# Patient Record
Sex: Male | Born: 1935 | Race: White | Hispanic: No | Smoking: Former smoker
Health system: Southern US, Community
[De-identification: ages and names within clinical notes are randomized; demographics above are authoritative.]

## PROBLEM LIST (undated history)

## (undated) DIAGNOSIS — M1990 Unspecified osteoarthritis, unspecified site: Secondary | ICD-10-CM

## (undated) DIAGNOSIS — K219 Gastro-esophageal reflux disease without esophagitis: Secondary | ICD-10-CM

## (undated) DIAGNOSIS — I10 Essential (primary) hypertension: Secondary | ICD-10-CM

## (undated) DIAGNOSIS — I480 Paroxysmal atrial fibrillation: Secondary | ICD-10-CM

## (undated) DIAGNOSIS — R001 Bradycardia, unspecified: Secondary | ICD-10-CM

## (undated) DIAGNOSIS — E785 Hyperlipidemia, unspecified: Secondary | ICD-10-CM

## (undated) HISTORY — PX: TONSILLECTOMY: SUR1361

## (undated) HISTORY — DX: Essential (primary) hypertension: I10

## (undated) HISTORY — DX: Paroxysmal atrial fibrillation: I48.0

## (undated) HISTORY — DX: Hyperlipidemia, unspecified: E78.5

## (undated) HISTORY — DX: Bradycardia, unspecified: R00.1

## (undated) HISTORY — PX: EYE SURGERY: SHX253

---
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
## 1966-08-07 HISTORY — PX: VASECTOMY: SHX75

## 1990-08-26 HISTORY — PX: OTHER SURGICAL HISTORY: SHX169

## 1999-02-05 HISTORY — PX: OTHER SURGICAL HISTORY: SHX169

## 2001-06-05 ENCOUNTER — Inpatient Hospital Stay (HOSPITAL_COMMUNITY): Admission: AD | Admit: 2001-06-05 | Discharge: 2001-06-07 | Payer: Self-pay | Admitting: Cardiology

## 2001-06-06 ENCOUNTER — Encounter: Payer: Self-pay | Admitting: Cardiology

## 2002-07-07 ENCOUNTER — Ambulatory Visit (HOSPITAL_COMMUNITY): Admission: RE | Admit: 2002-07-07 | Discharge: 2002-07-08 | Payer: Self-pay | Admitting: Internal Medicine

## 2002-07-07 ENCOUNTER — Encounter: Payer: Self-pay | Admitting: Internal Medicine

## 2002-07-07 HISTORY — PX: OTHER SURGICAL HISTORY: SHX169

## 2002-07-08 ENCOUNTER — Encounter: Payer: Self-pay | Admitting: Internal Medicine

## 2004-07-21 ENCOUNTER — Ambulatory Visit: Payer: Self-pay | Admitting: Internal Medicine

## 2004-10-21 ENCOUNTER — Ambulatory Visit: Payer: Self-pay | Admitting: Internal Medicine

## 2005-01-19 ENCOUNTER — Ambulatory Visit: Payer: Self-pay | Admitting: Internal Medicine

## 2005-04-20 ENCOUNTER — Ambulatory Visit: Payer: Self-pay | Admitting: Internal Medicine

## 2005-08-16 ENCOUNTER — Ambulatory Visit: Payer: Self-pay | Admitting: Internal Medicine

## 2005-09-18 ENCOUNTER — Ambulatory Visit: Payer: Self-pay | Admitting: Internal Medicine

## 2005-10-16 ENCOUNTER — Ambulatory Visit: Payer: Self-pay | Admitting: Internal Medicine

## 2005-11-28 ENCOUNTER — Ambulatory Visit: Payer: Self-pay | Admitting: Internal Medicine

## 2006-02-06 ENCOUNTER — Ambulatory Visit: Payer: Self-pay | Admitting: Internal Medicine

## 2006-03-22 ENCOUNTER — Ambulatory Visit: Payer: Self-pay | Admitting: Internal Medicine

## 2006-04-05 ENCOUNTER — Ambulatory Visit: Payer: Self-pay

## 2006-04-26 ENCOUNTER — Ambulatory Visit: Payer: Self-pay | Admitting: Internal Medicine

## 2006-05-25 ENCOUNTER — Ambulatory Visit: Payer: Self-pay | Admitting: Internal Medicine

## 2006-06-22 ENCOUNTER — Ambulatory Visit: Payer: Self-pay | Admitting: Internal Medicine

## 2006-07-20 ENCOUNTER — Ambulatory Visit: Payer: Self-pay | Admitting: Internal Medicine

## 2006-08-17 ENCOUNTER — Ambulatory Visit: Payer: Self-pay | Admitting: Internal Medicine

## 2006-09-12 ENCOUNTER — Ambulatory Visit: Payer: Self-pay | Admitting: Internal Medicine

## 2006-10-12 ENCOUNTER — Ambulatory Visit: Payer: Self-pay | Admitting: Internal Medicine

## 2006-11-08 ENCOUNTER — Ambulatory Visit: Payer: Self-pay | Admitting: Internal Medicine

## 2007-01-31 ENCOUNTER — Ambulatory Visit: Payer: Self-pay | Admitting: Internal Medicine

## 2007-02-04 ENCOUNTER — Emergency Department (HOSPITAL_COMMUNITY): Admission: EM | Admit: 2007-02-04 | Discharge: 2007-02-04 | Payer: Self-pay | Admitting: Emergency Medicine

## 2007-03-01 ENCOUNTER — Ambulatory Visit: Payer: Self-pay | Admitting: Internal Medicine

## 2007-03-29 ENCOUNTER — Ambulatory Visit: Payer: Self-pay | Admitting: Internal Medicine

## 2007-04-26 ENCOUNTER — Ambulatory Visit: Payer: Self-pay | Admitting: Internal Medicine

## 2007-05-14 ENCOUNTER — Ambulatory Visit: Payer: Self-pay | Admitting: Internal Medicine

## 2007-05-24 ENCOUNTER — Ambulatory Visit: Payer: Self-pay | Admitting: Internal Medicine

## 2007-06-21 ENCOUNTER — Ambulatory Visit: Payer: Self-pay | Admitting: Internal Medicine

## 2007-07-19 ENCOUNTER — Ambulatory Visit: Payer: Self-pay | Admitting: Internal Medicine

## 2007-09-16 ENCOUNTER — Ambulatory Visit: Payer: Self-pay | Admitting: Internal Medicine

## 2008-01-13 ENCOUNTER — Ambulatory Visit: Payer: Self-pay | Admitting: Internal Medicine

## 2008-04-14 ENCOUNTER — Ambulatory Visit: Payer: Self-pay | Admitting: Internal Medicine

## 2008-05-26 ENCOUNTER — Ambulatory Visit: Payer: Self-pay | Admitting: Internal Medicine

## 2008-07-14 ENCOUNTER — Ambulatory Visit: Payer: Self-pay | Admitting: Internal Medicine

## 2008-10-13 ENCOUNTER — Ambulatory Visit: Payer: Self-pay | Admitting: Internal Medicine

## 2008-11-20 ENCOUNTER — Encounter (INDEPENDENT_AMBULATORY_CARE_PROVIDER_SITE_OTHER): Payer: Self-pay | Admitting: *Deleted

## 2009-02-10 ENCOUNTER — Ambulatory Visit: Payer: Self-pay | Admitting: Internal Medicine

## 2009-05-12 ENCOUNTER — Ambulatory Visit: Payer: Self-pay | Admitting: Internal Medicine

## 2009-06-02 ENCOUNTER — Ambulatory Visit: Payer: Self-pay | Admitting: Internal Medicine

## 2009-06-02 DIAGNOSIS — Z950 Presence of cardiac pacemaker: Secondary | ICD-10-CM | POA: Insufficient documentation

## 2009-06-02 DIAGNOSIS — I495 Sick sinus syndrome: Secondary | ICD-10-CM | POA: Insufficient documentation

## 2009-06-02 DIAGNOSIS — I10 Essential (primary) hypertension: Secondary | ICD-10-CM | POA: Insufficient documentation

## 2009-06-02 DIAGNOSIS — E785 Hyperlipidemia, unspecified: Secondary | ICD-10-CM

## 2009-08-11 ENCOUNTER — Ambulatory Visit: Payer: Self-pay | Admitting: Internal Medicine

## 2009-11-10 ENCOUNTER — Ambulatory Visit: Payer: Self-pay | Admitting: Internal Medicine

## 2010-02-09 ENCOUNTER — Ambulatory Visit: Payer: Self-pay | Admitting: Internal Medicine

## 2010-05-11 ENCOUNTER — Ambulatory Visit: Payer: Self-pay | Admitting: Internal Medicine

## 2010-06-14 ENCOUNTER — Ambulatory Visit: Payer: Self-pay | Admitting: Internal Medicine

## 2010-08-10 ENCOUNTER — Encounter: Payer: Self-pay | Admitting: Internal Medicine

## 2010-08-25 ENCOUNTER — Ambulatory Visit: Payer: Self-pay | Admitting: Internal Medicine

## 2010-09-06 NOTE — Cardiovascular Report (Signed)
Summary: TTM   TTM   Imported By: Roderic Ovens 11/22/2009 12:25:28  _____________________________________________________________________  External Attachment:    Type:   Image     Comment:   External Document

## 2010-09-06 NOTE — Assessment & Plan Note (Signed)
Summary: device/saf   Visit Type:  Follow-up Primary Provider:  Dr. Aurora Mask   History of Present Illness: Maurice Hall returns today for PPM followup.  He is a pleasant 77 yo man with a h/o symptomatic bradycardia and HTN.  He denies c/p or sob.  He has rare palpitations.  No peripheral edema. No syncope.  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 His main complaint today was that he has pain in his knees when he walks or runs.  Current Medications (verified): 1)  Diovan Hct 160-12.5 Mg Tabs (Valsartan-Hydrochlorothiazide) .... Once Daily 2)  Simvastatin 20 Mg Tabs (Simvastatin) .... Once Daily 3)  Meloxicam 7.5 Mg Tabs (Meloxicam) .... Once Daily 4)  Osteo Bi-Flex Adv Joint Shield  Tabs (Misc Natural Products) .... 2 Tabs Once Daily 5)  Aspirin 325 Mg  Tabs (Aspirin) .... Once Daily 6)  Aspirin 81 Mg Tbec (Aspirin) .... Take One Tablet By Mouth Daily 7)  Fish Oil 1000 Mg Caps (Omega-3 Fatty Acids) .... 4 Daily  Allergies (verified): No Known Drug Allergies  Past History:  Past Medical History: Last updated: 10/02/2008 HTN Symptomatic bradycardia Dyslipidemia  Past Surgical History: Last updated: 10/02/2008 S/P PPM (Medtronic Sigma DDD) 12/03  Review of Systems  The patient denies chest pain, syncope, dyspnea on exertion, and peripheral edema.    Vital Signs:  Patient profile:   77 year old male Height:      74 inches Weight:      224 pounds BMI:     28.86 Pulse rate:   70 / minute BP sitting:   115 / 77  (left arm)  Vitals Entered By: Laurance Flatten CMA (June 14, 2010 4:20 PM)  Physical Exam  General:  Well developed, well nourished, in no acute distress.  HEENT: normal Neck: supple. No JVD. Carotids 2+ bilaterally no bruits Cor: RRR no rubs, gallops or murmur Lungs: CTA. Well healed PPM incision. Ab: soft, nontender. nondistended. No HSM. Good bowel sounds Ext: warm. no cyanosis, clubbing or edema Neuro: alert and oriented. Grossly nonfocal.  affect pleasant    PPM Specifications Following MD:  Lewayne Bunting, MD     PPM Vendor:  Medtronic     PPM Model Number:  [id number]     PPM Serial Number:  [id number] PPM DOI:  07/07/2002      Lead 1    Location: RA     DOI: 07/07/2002     Model #: [id number]     Serial #: [id number]     Status: active Lead 2    Location: RV     DOI: 07/07/2002     Model #: [id number]     Serial #: [id number]     Status: active   Indications:  SYNCOPE,  ASYSTOLE    PPM Follow Up Remote Check?  No Battery Voltage:  2.77 V     Battery Est. Longevity:  3 years     Pacer Dependent:  No       PPM Device Measurements Atrium  Amplitude: 2.8 mV, Impedance: 496 ohms, Threshold: 1.0 V at 0.4 msec Right Ventricle  Amplitude: 11.20 mV, Impedance: 826 ohms, Threshold: 1.0 V at 0.4 msec  Episodes MS Episodes:  1668     Percent Mode Switch:  0.5%     Coumadin:  No Ventricular High Rate:  5     Atrial Pacing:  68.3%     Ventricular Pacing:  50.1%  Parameters Mode:  DDDR     Lower Rate  Limit:  50     Upper Rate Limit:  120 Paced AV Delay:  310     Sensed AV Delay:  300 Next Cardiology Appt Due:  12/06/2010 Tech Comments:  No parameter changes. Device function normal.  5VHR episodes 1-3 seconds.  The longest mode switch was 4:27 minutes, - coumadin.  TTM's with [facility].  ROV 6months clinic. MD Comments:  Agree with above.  Impression & Recommendations:  Problem # 1:  CARDIAC PACEMAKER IN SITU (ICD-V45.01) His device is working normally.  Will recheck in several months.  Problem # 2:  ESSENTIAL HYPERTENSION, BENIGN (ICD-401.1) His blood pressure remains well controlled.  A low sodium diet is being followed. His updated medication list for this problem includes:    Diovan Hct 160-12.5 Mg Tabs (Valsartan-hydrochlorothiazide) ..... Once daily    Aspirin 325 Mg Tabs (Aspirin) ..... Once daily    Aspirin 81 Mg Tbec (Aspirin) .Marland Kitchen... Take one tablet by mouth daily  Problem # 3:  DYSLIPIDEMIA (ICD-272.4) He will continue his  current meds. His updated medication list for this problem includes:    Simvastatin 20 Mg Tabs (Simvastatin) ..... Once daily  Patient Instructions: 1)  Your physician wants you to follow-up in:  6 months with device clinic and 12 months with Dr Court Joy will receive a reminder letter in the mail two months in advance. If you don't receive a letter, please call our office to schedule the follow-up appointment.

## 2010-09-06 NOTE — Cardiovascular Report (Signed)
Summary: TTM   TTM   Imported By: Roderic Ovens 08/24/2009 14:41:55  _____________________________________________________________________  External Attachment:    Type:   Image     Comment:   External Document

## 2010-09-06 NOTE — Cardiovascular Report (Signed)
Summary: TTM   TTM   Imported By: Roderic Ovens 05/24/2010 11:07:13  _____________________________________________________________________  External Attachment:    Type:   Image     Comment:   External Document

## 2010-09-06 NOTE — Cardiovascular Report (Signed)
Summary: TTM   TTM   Imported By: Roderic Ovens 02/25/2010 09:22:53  _____________________________________________________________________  External Attachment:    Type:   Image     Comment:   External Document

## 2010-09-08 NOTE — Cardiovascular Report (Signed)
Summary: TTM   TTM   Imported By: Roderic Ovens 08/25/2010 16:30:20  _____________________________________________________________________  External Attachment:    Type:   Image     Comment:   External Document

## 2010-11-09 ENCOUNTER — Encounter: Payer: Self-pay | Admitting: Internal Medicine

## 2010-11-09 DIAGNOSIS — I498 Other specified cardiac arrhythmias: Secondary | ICD-10-CM

## 2010-12-20 NOTE — Assessment & Plan Note (Signed)
[facility]                         ELECTROPHYSIOLOGY OFFICE NOTE   NAME:[name] [name] BEBOUT                         MRN:          [id number]  DATE:05/26/2008                            DOB:          Jul 31, 1931    [name] [name] returns today for followup.  He is a very pleasant 77 year old  man with the history of hypertension who has dyslipidemia and sinus  bradycardia.  He returns today for followup.  He denies chest pain.  He  denies shortness of breath.  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  He also works out several times a week and  rides in road bike before and after football season.  He had no specific  complaints today.   MEDICATIONS:  1. Meloxicam 7.5 a day.  2. Lipitor 10 a day.  3. Question simvastatin 20 a day.  4. Benicar/HCTZ 20/12.5 twice a day.  5. Aspirin 81 mg daily.  6. Fish oil.   PHYSICAL EXAMINATION:  GENERAL:  He is a pleasant well-appearing man in  no acute distress.  VITAL SIGNS:  Blood pressure today was 122/70, the pulse 64 and regular,  respirations were 18, weight was 227 pounds.  NECK:  No jugular distention.  LUNGS:  Clear bilaterally to auscultation.  No wheezes, rales, or  rhonchi are present.  LUNGS:  There is no increased work of breathing.  Cardiovascular:  Regular rate and rhythm.  Normal S1 and S2, no murmurs,  rubs, or gallops.  ABDOMEN:  Soft, nontender.  There is no organomegaly.  EXTREMITIES:  No cyanosis, clubbing, or edema.  The pulses are 2+ and  symmetric.   Interrogation of his pacemaker demonstrates a Medtronic segment.  The P  and R waves are greater than 2 and 11 respectively.  The impedance 500  in the A, 863 in the V, thresholds of volt of 0.4 in the A and a volt of  0.4 in the right ventricle.  Battery voltage was 2.79 volts.  Estimated  longevity is 5 years.  He was mode switched for less than 1% of the  time, the longest of which was 53 seconds, he is 66% A  paced, 45% V  paced.  Despite a long AV delay.   PROBLEM:  1. Symptomatic bradycardia.  2. Hypertension.  3. Dyslipidemia.  4. Status post pacemaker insertion.   DISCUSSION:  [name] [name] is stable.  His pacemaker is working normally.  No reprogramming was made today.  With regard to his hypertension, his  blood pressure is nicely controlled with  Benicar.  With regard to dyslipidemia, he remains on simvastatin without  problem.  I will plan to see the patient back in the office for  pacemaker followup in 1 year.     [name] [name]. Ladona Ridgel, MD  Electronically Signed    GWT/MedQ  DD: 05/26/2008  DT: 05/26/2008  Job #: [id number]

## 2010-12-20 NOTE — Assessment & Plan Note (Signed)
[facility]                         ELECTROPHYSIOLOGY OFFICE NOTE   NAME:Maurice [name], Maurice [name]                         MRN:          [id number]  DATE:05/14/2007                            DOB:          August 01, 1931    HISTORY:  Mr. Meyn returns today for followup.  He is now almost five  years out from his pacemaker implant, secondary to symptomatic  bradycardia.  He also has a history of syncope.  He returns today for  followup.  He has done well.  His main complaint is that of pain in his  knees when he runs.  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  He has no specific complaints today.  He did note some dyspnea with very  extreme activity in the past, though not today.   PHYSICAL EXAMINATION:  GENERAL:  He is a pleasant, well-appearing 77-  year-old man, in no acute distress.  VITAL SIGNS:  Blood pressure 130/90, pulse 60 and regular, respirations  18.  Weight was not recorded.  NECK:  Revealed no jugular venous distention.  There is no thyromegaly.  LUNGS:  Clear bilaterally to auscultation.  No wheezes, rales or rhonchi  are present.  There is no increased work of breathing.  CARDIOVASCULAR:  A regular rate and rhythm with normal S1 and S2.  There  are no murmurs, rubs or gallops.  EXTREMITIES:  Demonstrated no clubbing, cyanosis or edema.   Interrogation of his pacemaker demonstrates a Medtronic Sigma dual-  chamber device with P-waves greater than 2 and R-waves greater than 11.  The impedance is 490 in the atrium and 711 in the ventricle.  The  threshold is 1 volt at 0.4 in the atrium and 0.5 at 0.4 in the right  ventricle.  He was A-pacing 99% of the time and V-pacing was 36% of the  time.  His AV delay was maxed out at 320.  His mode switch was turned on  today.   IMPRESSION:  1. Symptomatic bradycardia.  2. Hypertension.  3. Status post pacemaker insertion.   DISCUSSION:  Overall Mr. Formica is stable and his pacemaker is working  well.  He will continue his present medical therapy.  I will see him  back in one year.     Doylene Canning. Ladona Ridgel, MD  Electronically Signed    GWT/MedQ  DD: 05/14/2007  DT: 05/15/2007  Job #: [id number]   cc:   Abner Greenspan, M.D.

## 2010-12-23 NOTE — Discharge Summary (Signed)
   NAME:  Maurice Hall, Maurice Hall                            ACCOUNT NO.:  [id number]   MEDICAL RECORD NO.:  [id number]                   PATIENT TYPE:  OIB   LOCATION:  6529                                 FACILITY:  [facility]   PHYSICIAN:  Doylene Canning. Ladona Ridgel, M.D. [facility]           DATE OF BIRTH:  July 02, 1931   DATE OF ADMISSION:  07/07/2002  DATE OF DISCHARGE:  07/08/2002                                 DISCHARGE SUMMARY   PRIMARY DISCHARGE DIAGNOSIS:  Bradycardia.   HISTORY OF PRESENT ILLNESS:  This is a 77 year old gentleman with a history  of recurrent syncope, episodes of unexplained etiology, underwent  Implantable loop recorder insertion one year ago.  He was in his usual state  of health.  While giving blood several days ago, the patient passed out.  His cardiac monitor was activated, and he subsequently was found to have  complete heart block with ventricular 15 second pause.  He also complained  of spells occurring several months ago where he became near syncopal after  chopping up an oak tree.  The patient was admitted for placement of a  permanent pacemaker.   HOSPITAL COURSE:  He underwent placement of a dual-chamber Medtronic  pacemaker on 07/07/2002 without immediate complications.  Tolerated the  procedure well.  Interrogation of the device the following day showed it to  be within normal limits.  He was discharged to home.   DISCHARGE MEDICATIONS:  1. Coated aspirin 325 mg daily.  2. Lipitor 10 mg daily.  3. Diovan/Hydrochlorothiazide 160/25 b.i.d.  4. Vioxx 25 mg daily.  5. Tylenol 1 to 2 tablets every 4 to 6 hours as needed for pain.   ACTIVITY AND WOUND CARE:  Per pacemaker sheet.   DIET:  Low-fat, low-cholesterol, low-salt diet.    FOLLOW UP:  A followup appointment was scheduled with the pacemaker clinic  on December 17 at 9 a.m., and he is to follow up with Dr. Lewayne Bunting in  two to three months.  The office will call and schedule that appointment.      Chinita Pester,  C.R.N.P. [facility]                 Doylene Canning. Ladona Ridgel, M.D. [facility]    DS/MEDQ  D:  07/08/2002  T:  07/08/2002  Job:  [id number]   cc:   Dr. Phoebe Perch, [facility]   Kathrine Cords, R.N. [facility]  [address]  [place], [place] [address]  Fax: 1

## 2010-12-23 NOTE — Discharge Summary (Signed)
[facility]  Patient:    Maurice Hall, Maurice Hall Visit Number: [id number] MRN: [id number]          Service Type: MED Location: 2000 2037 01 Attending Physician:  Mirian Mo Dictated by:   Chinita Pester, N.P. Admit Date:  06/05/2001 Discharge Date: 06/07/2001   CC:         Dr. Shanon Rosser, [place]                           Discharge Summary  PRIMARY DIAGNOSIS:  Presyncope.  HISTORY OF PRESENT ILLNESS:  A 77 year old gentleman with no previous cardiac history who was admitted to [facility] for evaluation of sinus bradycardia with no recent syncope. The patient had a remote history of vagally mediated syncope.  He fainted at the sight of blood at the age of 44, fainted three years ago after coming out of a hot tub.  Recently, he experienced near syncope while seated across from his wife at a cafeteria, noted sudden dizziness but no chest pain, nausea, vomiting, or tachy palp. Noted to be diaphoretic by his wife.  Episode lasted a few seconds. Seen by an M.D. for follow-up.  He was noted to have blood pressure in 170s with heart rate 48.  EKG was worrisome, therefore, he was sent to the ER for blood work. He was seen by Shiv K. Harsh, M.D.  He was transferred to [facility]. [facility]. He was placed on Altace for his hypertension.  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  Dr. Graciela Husbands was not convinced that the bradycardia was responsible for the symptoms and a loop recorder was decided to be implanted. The patient had no significant bradycardia on his telemetry.  He had a stress Cardiolite performed which showed no ischemia.  He underwent placement of an implantable loop recorder on June 07, 2001.  He tolerated the procedure well and was discharged the same day in stable condition.  He was discharge on the following  medications.  DISCHARGE MEDICATIONS: 1. Altace 2.5 b.i.d. 2. Lipitor 10 nightly. 3. Vioxx 25 daily. 4. Aspirin 325 daily.  ACTIVITY:  He was instructed not to do any heavy lifting or strenuous activity for four days or no driving.  DIET:  Low fat, low cholesterol, low salt diet.  WOUND CARE:  He was to keep his incision clean and dry.  No shower for one week.  FOLLOW-UP:  He is to have a BMET in one week.  He is to follow up with Doylene Canning. Ladona Ridgel, M.D., on September 11, 2000, at 10:15 a.m. and a physicians assistant on June 21, 2001, at 9 a.m. for a wound check.  The patient is requesting to follow up with [facility] for his primary cardiologist. Dictated by:   Chinita Pester, N.P. Attending Physician:  Mirian Mo DD:  06/07/01 TD:  06/10/01 Job: [id number]

## 2010-12-23 NOTE — Procedures (Signed)
[facility]  Patient:    Maurice Hall, Maurice Hall Visit Number: [id number] MRN: [id number]          Service Type: MED Location: 2000 2037 01 Attending Physician:  Mirian Mo Dictated by:   Doylene Canning. Ladona Ridgel, M.D. [facility] Proc. Date: 06/07/01 Admit Date:  06/05/2001 Discharge Date: 06/07/2001   CC:         Shiv K. Harsh, M.D.   Procedure Report  PROCEDURE:  Implantation of an implantable loop recorder.  INDICATION:  Recurrent episodes of near-syncope along with a history of syncope.  INTRODUCTION:  The patient is a very pleasant 77 year old male with a history of near-syncope as well as syncope in the past.  He was subsequently found to have bradycardia with heart rates in the 40s.  However, it was noted that the patients bradycardia had been present for a long time, and he is now referred for implantable loop recorder.  DESCRIPTION OF PROCEDURE:  After informed consent was obtained, the patient was taken to the diagnostic EP lab in the fasted state.  After the usual preparation and draping, a total of 30 cc of lidocaine was infiltrated into the left pectoral region.  A 3 cm incision was carried out over this region and electrocautery utilized to dissect down to the fascial plane.  A subcutaneous pocket was then made with electrocautery.  The Medtronic implantable loop recorder was inserted into the pocket after irrigation had been given to the pocket, and the recorder was secured with two silk sutures. Additional kanamycin irrigation was utilized to irrigate the pocket, and the incision was closed with a layer of 2-0 Vicryl, followed by a layer of 3-0 Vicryl, followed by a layer of 4-0 Vicryl.  Benzoin was painted on the skin and Steri-Strips were applied and a pressure dressing placed and the patient returned to his room in satisfactory condition.  COMPLICATIONS:  None.  RESULTS:  This demonstrates successful implantation of a Medtronic  implantable loop recorder in a patient with unexplained syncope. Dictated by:   Doylene Canning. Ladona Ridgel, M.D. [facility] Attending Physician:  Mirian Mo DD:  06/07/01 TD:  06/08/01 Job: [id number]

## 2010-12-23 NOTE — Assessment & Plan Note (Signed)
[facility]                           ELECTROPHYSIOLOGY OFFICE NOTE   NAME:Fabro, SHAWNDELL SCHILLACI                         MRN:          [id number]  DATE:04/05/2006                            DOB:          06-20-1931    Mr. Mander was seen today in the clinic at his request complaining of some  palpitations and rapid heart rate yesterday, noted last evening and this  morning seemed to have calmed down. The one rate that he did mention was a  heart rate of 85, which is a magnet mode rate with this particular device.   On interrogation of his device, the battery voltage is 2.79.  There were 150  high atrial episodes noted, very short in duration. The longest one was 14  minutes long.  Most of them lasted only seconds.  There were two high  ventricular rate episodes, again only lasting seconds, about 3 seconds each.  None of these episodes were dated yesterday.  His histograms appeared to be  normal.  He is A sense/V sense 38.5% of the time. The rest of the time he is  pacing in either one or both chambers.  I did explain to this gentleman the  dynamics of the pacemaker and that his lower rate is set at 50 with an upper  rate of 120 and 85 is not an unusual rate for someone to be out on their  own. This seemed to appease him and we will continue to check him as  scheduled.  I instructed him if anything else arises or heart rates over 120  to please let us know.                                   Altha Harm, LPN                                Doylene Canning. Ladona Ridgel, MD   PO/MedQ  DD:  04/05/2006  DT:  04/06/2006  Job #:  [id number]

## 2010-12-23 NOTE — Op Note (Signed)
NAME:  Maurice Hall, Maurice Hall                            ACCOUNT NO.:  [id number]   MEDICAL RECORD NO.:  [id number]                   PATIENT TYPE:  OIB   LOCATION:  6529                                 FACILITY:  [facility]   PHYSICIAN:  Doylene Canning. Ladona Ridgel, M.D. [facility]           DATE OF BIRTH:  04/24/1931   DATE OF PROCEDURE:  07/07/2002  DATE OF DISCHARGE:                                 OPERATIVE REPORT   ELECTROPHYSIOLOGIC PROCEDURE NOTE:   PROCEDURE PERFORMED:  Insertion of a dual chamber pacemaker followed by  extraction of an implantable loop recorder.   INTRODUCTION:  The patient is a very pleasant, 77 year old man with a  history of unexplained syncope in the past.  Because of this, he  subsequently underwent implantable loop recorder and had been wearing this  for nearly a year when he developed a recurrent syncopal spell.  This was  associated with a 15-second pause and subsequent ventricular standstill  resulting in syncope.  He is now referred for permanent pacemaker insertion.   PROCEDURE:  After informed consent was obtained, the patient was taken to  the diagnostic EP lab in the fasting state.  After the usual preparation and  draping, a total of 30 cc of lidocaine was infiltrated into the left  infraclavicular region.  A 6-cm incision was carried out over this region,  and electrocautery utilized to dissect down to the pectoralis fascia.  Then  10 cc of contrast was injected into the left upper extremity venous system  and demonstrated a patent left subclavian vein.  It was subsequently  punctured, and the Medtronic Model #5076, 58-cm active fixation pacing lead  was placed in the right ventricle.  The ventricular lead Serial number was  [id number].  Next, the Medtronic Model 5076, 52-cm active fixation lead was  placed in the right atrium.  The atrial lead Serial number was [id number].  Mapping was then carried out in the right ventricle and at the final site,  the R waves measured  15 mV, and the pacing threshold, once the lead was  actively fixed, was 0.6 volts at 0.5 msec with a pacing impedance of  approximately 1000 ohms.  The 10-volt pacing did not result in diaphragmatic  stimulation.  Attention was then turned to the atrial lead.  The atrial lead  was placed in the right atrial appendage where P waves measured 3 mV and the  atrial threshold was 1.2 volts at 0.5 msec with pacing impedance of 598  ohms.  This was present after the lead was actively fixed.  Again 10-volt  pacing did not result in diaphragmatic stimulation.  With the atrial and  ventricular leads in satisfactory position, they were secured to the  subpectoralis fascia with a figure-of-eight silk suture.  Electrocautery was  utilized to make a subcutaneous pocket.  Kanamycin irrigation was utilized  to irrigate the pocket, and electrocautery utilized to assure hemostasis.  The Medtronic Sigma dual chamber pacemaker, Serial [id number] was connected  to the atrial and ventricular pacing leads and placed in the subcutaneous  pocket.  The generator was secured with a silk suture.  Kanamycin irrigation  was again utilized to irrigate the incision, and the incision was then  closed with a layer of 2-0 Vicryl followed by a layer of 3-0 Vicryl followed  by a layer of 4-0 Vicryl.  Benzoin was painted on the skin.  Steri-Strips  were applied and then attention turned to removal of the implantable loop  recorder.   Again, approximately 20 cc of lidocaine was infiltrated into the region over  the old implantable loop recorder.  A 4-cm incision was carried out over  this region, and electrocautery utilized to dissect down to the old  implantable loop recorder.  It was subsequently removed with gentle  traction.  The silk sutures were removed from the pocket, and the pocket was  irrigated with Kanamycin.  After electrocautery was utilized to assure  hemostasis, the incision was closed with a layer of 2-0  Vicryl, followed by  a layer of 3-0 Vicryl, followed by a layer of 4-0 Vicryl.  Benzoin was  painted on the skin.  Steri-Strips were applied and a pressure dressing  placed, and the patient returned to his room in satisfactory condition.   COMPLICATIONS:  There were no immediate procedural complications.   RESULTS:  Demonstrate successful implantation of a Medtronic dual chamber  pacemaker followed by the removal of a Medtronic implantable loop recorder  in a patient with intermittent heart block associated with syncope.                                               Doylene Canning. Ladona Ridgel, M.D. [facility]    GWT/MEDQ  D:  07/07/2002  T:  07/07/2002  Job:  [id number]   cc:   Harrie Foreman, M.D.   Yetta Flock, M.D.  [place], [place]   Kathrine Cords, R.N. [facility]  [address]  [place], [place] [address]  Fax: 1

## 2011-03-09 ENCOUNTER — Encounter: Payer: Self-pay | Admitting: Internal Medicine

## 2011-03-09 DIAGNOSIS — I498 Other specified cardiac arrhythmias: Secondary | ICD-10-CM

## 2011-04-04 ENCOUNTER — Encounter: Payer: Self-pay | Admitting: *Deleted

## 2011-05-24 LAB — COMPREHENSIVE METABOLIC PANEL
ALT: 20
AST: 21
Albumin: 3.7
Calcium: 8.8
Creatinine, Ser: 1.15
GFR calc Af Amer: >60
Sodium: 138
Total Protein: 6.3

## 2011-05-24 LAB — DIFFERENTIAL
Eosinophils Absolute: 0
Eosinophils Relative: 1
Lymphocytes Relative: 7 — ABNORMAL LOW
Lymphs Abs: 0.5 — ABNORMAL LOW
Monocytes Relative: 6

## 2011-05-24 LAB — URINE MICROSCOPIC-ADD ON

## 2011-05-24 LAB — CBC
HCT: 43.2
Hemoglobin: 14.7
MCHC: 34
MCV: 96.9
Platelets: 130 — ABNORMAL LOW
RBC: 4.46
RDW: 12.8
WBC: 6.7

## 2011-05-24 LAB — URINALYSIS, ROUTINE W REFLEX MICROSCOPIC
Glucose, UA: NEGATIVE
Ketones, ur: NEGATIVE
Leukocytes, UA: NEGATIVE
Protein, ur: NEGATIVE
pH: 6

## 2011-06-06 ENCOUNTER — Encounter: Payer: Self-pay | Admitting: Internal Medicine

## 2011-06-06 ENCOUNTER — Ambulatory Visit (INDEPENDENT_AMBULATORY_CARE_PROVIDER_SITE_OTHER): Payer: Medicare Other | Admitting: Internal Medicine

## 2011-06-06 DIAGNOSIS — Z950 Presence of cardiac pacemaker: Secondary | ICD-10-CM

## 2011-06-06 DIAGNOSIS — I10 Essential (primary) hypertension: Secondary | ICD-10-CM

## 2011-06-06 DIAGNOSIS — I495 Sick sinus syndrome: Secondary | ICD-10-CM

## 2011-06-06 LAB — PACEMAKER DEVICE OBSERVATION
AL AMPLITUDE: 2.8 mv
BAMS-0001: 175 {beats}/min
BATTERY VOLTAGE: 2.76 V
RV LEAD AMPLITUDE: 11.2 mv
RV LEAD IMPEDENCE PM: 698 Ohm
VENTRICULAR PACING PM: 45.3

## 2011-06-06 NOTE — Assessment & Plan Note (Signed)
His blood pressure is well controlled. He will continue his current medical therapy and maintain a low-sodium diet. 

## 2011-06-06 NOTE — Progress Notes (Signed)
HPI Maurice Hall returns today for followup. He is a pleasant 77yo man with a h/o symptomatic bradycardia and hypertension. He is status post pacemaker insertion. He continues to do quite well. He's had no syncope, chest pain, or shortness of breath. 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 The patient is bothered mostly by right knee pain. This has been a chronic problem for which he has had 3 surgeries in the past. He denies fevers and chills. Not on File   Current Outpatient Prescriptions  Medication Sig Dispense Refill  . aspirin 81 MG tablet Take 81 mg by mouth daily.        . fish oil-omega-3 fatty acids 1000 MG capsule Take 2 g by mouth daily.        . meloxicam (MOBIC) 7.5 MG tablet Take 7.5 mg by mouth daily.        . Misc Natural Products (OSTEO BI-FLEX JOINT SHIELD) TABS Take by mouth daily.        . simvastatin (ZOCOR) 20 MG tablet Take 20 mg by mouth at bedtime.        . valsartan-hydrochlorothiazide (DIOVAN-HCT) 160-25 MG per tablet Take 1 tablet by mouth daily.           Past Medical History  Diagnosis Date  . HTN (hypertension)   . Symptomatic bradycardia   . Dyslipidemia     ROS:   All systems reviewed and negative except as noted in the HPI.   Past Surgical History  Procedure Date  . S/p ppm (medtronic sigma ddd 12/03     Family History  Problem Relation Age of Onset  . Coronary artery disease Neg Hx      History   Social History  . Marital Status: Married    Spouse Name: N/A    Number of Children: N/A  . Years of Education: N/A   Occupational History  . Not on file.   Social History Main Topics  . Smoking status: Former Games developer  . Smokeless tobacco: Not on file  . Alcohol Use: Not on file  . Drug Use: Yes     Rare ETOH   . Sexually Active: Not on file   Other Topics Concern  . Not on file   Social History Narrative  . No narrative on file     BP 120/60  Pulse 61  Ht 6\' 2"  (1.88 m)  Wt 228 lb 12.8 oz  (103.783 kg)  BMI 29.38 kg/m2  Physical Exam:  Well appearing NAD HEENT: Unremarkable Neck:  No JVD, no thyromegally Lymphatics:  No adenopathy Back:  No CVA tenderness Lungs:  Clear no wheezes, rales, or rhonchi. Well-healed pacemaker incision. HEART:  Regular rate rhythm, no murmurs, no rubs, no clicks Abd:  soft, positive bowel sounds, no organomegally, no rebound, no guarding Ext:  2 plus pulses, no edema, no cyanosis, no clubbing Skin:  No rashes no nodules Neuro:  CN II through XII intact, motor grossly intact  DEVICE  Normal device function.  See PaceArt for details.   Assess/Plan:

## 2011-06-06 NOTE — Assessment & Plan Note (Signed)
His device is working normally. Plan to recheck in several months. He has approximately 2-1/2 years before reaching elective replacement

## 2011-06-06 NOTE — Patient Instructions (Signed)
Your physician wants you to follow-up in: 12 months with Dr. Taylor. You will receive a reminder letter in the mail two months in advance. If you don't receive a letter, please call our office to schedule the follow-up appointment.    

## 2011-09-12 ENCOUNTER — Other Ambulatory Visit: Payer: Self-pay | Admitting: Orthopedic Surgery

## 2011-09-19 ENCOUNTER — Encounter: Payer: Self-pay | Admitting: Internal Medicine

## 2011-09-19 DIAGNOSIS — I498 Other specified cardiac arrhythmias: Secondary | ICD-10-CM

## 2011-11-30 ENCOUNTER — Encounter (HOSPITAL_COMMUNITY): Payer: Self-pay | Admitting: Pharmacy Technician

## 2011-12-04 ENCOUNTER — Encounter (HOSPITAL_COMMUNITY)
Admission: RE | Admit: 2011-12-04 | Discharge: 2011-12-04 | Disposition: A | Discharge status: Home / Self Care | Payer: Medicare Other | Source: Ambulatory Visit | Attending: Orthopedic Surgery | Admitting: Orthopedic Surgery

## 2011-12-04 ENCOUNTER — Ambulatory Visit (HOSPITAL_COMMUNITY)
Admission: RE | Admit: 2011-12-04 | Discharge: 2011-12-04 | Disposition: A | Discharge status: Home / Self Care | Payer: Medicare Other | Source: Ambulatory Visit | Attending: Orthopedic Surgery | Admitting: Orthopedic Surgery

## 2011-12-04 ENCOUNTER — Encounter (HOSPITAL_COMMUNITY): Payer: Self-pay

## 2011-12-04 DIAGNOSIS — Z01812 Encounter for preprocedural laboratory examination: Secondary | ICD-10-CM | POA: Insufficient documentation

## 2011-12-04 DIAGNOSIS — Z01818 Encounter for other preprocedural examination: Secondary | ICD-10-CM | POA: Insufficient documentation

## 2011-12-04 DIAGNOSIS — R9431 Abnormal electrocardiogram [ECG] [EKG]: Secondary | ICD-10-CM | POA: Insufficient documentation

## 2011-12-04 DIAGNOSIS — Z950 Presence of cardiac pacemaker: Secondary | ICD-10-CM | POA: Insufficient documentation

## 2011-12-04 DIAGNOSIS — Z01810 Encounter for preprocedural cardiovascular examination: Secondary | ICD-10-CM | POA: Insufficient documentation

## 2011-12-04 HISTORY — DX: Unspecified osteoarthritis, unspecified site: M19.90

## 2011-12-04 HISTORY — DX: Gastro-esophageal reflux disease without esophagitis: K21.9

## 2011-12-04 LAB — URINALYSIS, ROUTINE W REFLEX MICROSCOPIC
Leukocytes, UA: NEGATIVE
Nitrite: NEGATIVE
Specific Gravity, Urine: 1.009 (ref 1.005–1.030)
Urobilinogen, UA: 0.2 mg/dL (ref 0.0–1.0)
pH: 6.5 (ref 5.0–8.0)

## 2011-12-04 LAB — SURGICAL PCR SCREEN: Staphylococcus aureus: POSITIVE — AB

## 2011-12-04 LAB — COMPREHENSIVE METABOLIC PANEL
AST: 27 U/L (ref 0–37)
Albumin: 4.2 g/dL (ref 3.5–5.2)
Calcium: 10 mg/dL (ref 8.4–10.5)
Chloride: 101 mEq/L (ref 96–112)
Creatinine, Ser: 1.02 mg/dL (ref 0.50–1.35)
Total Protein: 7.4 g/dL (ref 6.0–8.3)

## 2011-12-04 LAB — CBC
MCH: 32.7 pg (ref 26.0–34.0)
MCHC: 34.6 g/dL (ref 30.0–36.0)
MCV: 94.5 fL (ref 78.0–100.0)
Platelets: 151 10*3/uL (ref 150–400)
RDW: 12.4 % (ref 11.5–15.5)
WBC: 6.1 10*3/uL (ref 4.0–10.5)

## 2011-12-04 LAB — PROTIME-INR: INR: 0.96 (ref 0.00–1.49)

## 2011-12-04 NOTE — Progress Notes (Signed)
12/04/11 1150  OBSTRUCTIVE SLEEP APNEA  Have you ever been diagnosed with sleep apnea through a sleep study? No  Do you snore loudly (loud enough to be heard through closed doors)?  0  Do you often feel tired, fatigued, or sleepy during the daytime? 0  Has anyone observed you stop breathing during your sleep? 0  Do you have, or are you being treated for high blood pressure? 1  BMI more than 35 kg/m2? 0  Age over 76 years old? 1  Neck circumference greater than 40 cm/18 inches? 1  Gender: 1  Obstructive Sleep Apnea Score 4   Score 4 or greater  Updated health history

## 2011-12-04 NOTE — Patient Instructions (Signed)
[name]  12/04/2011   Your procedure is scheduled on:  Monday 12/11/2011 at 1235pm  Report to [facility] at 1000 AM.  Call this number if you have problems the morning of surgery: [phone]   Remember:   Do not eat food:After Midnight.  May have clear liquids:up to 6 Hours before arrival- may have clear liquids from midnight up until 635am then nothing  Clear liquids include soda, tea, black coffee, apple or grape juice, broth.  Take these medicines the morning of surgery with A SIP OF WATER: none   Do not wear jewelry  Do not wear lotions, powders, or perfumes.   Do not shave 48 hours prior to surgery-women only-shaving legs  Do not bring valuables to the hospital.  Contacts, dentures or bridgework may not be worn into surgery.  Leave suitcase in the car. After surgery it may be brought to your room.  For patients admitted to the hospital, checkout time is 11:00 AM the day of discharge.       Special Instructions: CHG Shower Use Special Wash: 1/2 bottle night before surgery and 1/2 bottle morning of surgery.   Please read over the following fact sheets that you were given: MRSA Information,Sleep apnea sheet, Incentive Spirometry sheet, Blood transfusion sheet

## 2011-12-04 NOTE — Pre-Procedure Instructions (Signed)
Talked with Aurella Corley in the call center who will get a message to Dr. Lequita Halt.

## 2011-12-05 NOTE — Pre-Procedure Instructions (Signed)
Avel Peace pa called aware of abnormal labs and nothing needs to be done, pt ok for surgery

## 2011-12-10 ENCOUNTER — Other Ambulatory Visit: Payer: Self-pay | Admitting: Orthopedic Surgery

## 2011-12-10 MED ORDER — BUPIVACAINE 0.25 % ON-Q PUMP SINGLE CATH 300ML
300.0000 mL | INJECTION | Status: DC
  Filled 2011-12-10: qty 300

## 2011-12-10 NOTE — H&P (Signed)
Maurice Hall  DOB: 02/05/1931 Married / Language: English / Race: White Male  Date of Admission:  12/11/2011  Chief Complaint:  Right Knee Pain  History of Present Illness The patient is a 78 year old male who comes in for a preoperative History and Physical. The patient is scheduled for a right total knee arthroplasty to be performed by Dr. Frank V. Aluisio, MD at [facility] on 12/11/2011. The patient is a 78 year old male who presents with knee complaints. The patient reports right knee (worse than left) symptoms including: pain, swelling, catching, giving way, weakness and stiffness which began 3 month(s) (but he has a history of previous knee issues) ago without any known injury.The patient feels that the symptoms are worsening (had scopes by both Dr. Sue and Dr. Collins over 10 years ago). Prior to being seen today the patient was previously evaluated in this clinic. Current treatment includes nonsteroidal anti-inflammatory drugs (Mobic). 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 He states that "the adrenaline was pumping" during the games so he did not notice the pain but he would be in considerable pain after the games. He states that since the season ended he is finding it very difficult to walk. At the current time the right knee is hurting him more, but his left knee is also painful. He had a cortisone injection in the left knee about 5 years ago with relief a couple months. He has never had cortisone injection in the right knee. He has never had viscosupplementation. He states that he would prefer to have his knee replaced as opposed to a temporary solution with injections. Maurice Hall is a well developed male who is alert and oriented. He is in no apparent distress. Evaluation of his hip shows a normal range of motion with no discomfort. The left knee shows no effusion. Range of motion is about 5-125 degrees. There is a slight crepitus on range of  motion. No jointline tenderness or instability. The right knee shows about a 5-10 degree varus deformity. Range is about 10-125. Marked crepitus on range of motion. Tenderness medial greater than lateral with no instability. No effusion. Pulse, sensation and motor are intact. He walks with an antalgic gait pattern. Ready to proceed with surgery. They have been treated conservatively in the past for the above stated problem and despite conservative measures, they continue to have progressive pain and severe functional limitations and dysfunction. They have failed non-operative management including home exercise, medications, and injections. It is felt that they would benefit from undergoing total joint replacement. Risks and benefits of the procedure have been discussed with the patient and they elect to proceed with surgery. There are no active contraindications to surgery such as ongoing infection or rapidly progressive neurological disease.  Allergies No Known Drug Allergies  Medication History Losartan Potassium-HCTZ (100-25MG Tablet, Oral daily) Active. Mobic (7.5MG Tablet, Oral daily) Active. Simvastatin (20MG Tablet, Oral daily) Active. Aspirin Adult Low Strength (81MG Tablet DR, Oral daily) Active. Fish Oil (5000mg daily) Active. Osteo Bi-Flex Adv Double St (2 Oral daily) Active.  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 11/19/1992 Osteoarthrosis NOS, lower leg (715.96). 06/25/1996 Bradycardia (427.89). Pacemaker Placement Hypercholesterolemia Chronic Pain Gastroesophageal Reflux Disease High blood pressure Impaired Hearing Tinnitus Pacemaker Syncope. Secondary to Bradycardia  Past Surgical History Cataract Surgery. bilateral, Lens Implants Rotator Cuff Repair. right Appendectomy. Date: 06/10/1945. Arthroscopy of Knee. bilateral; Left - 1968, Right - 1975 Arthroscopy   of Shoulder. Date: 1999. right Tonsillectomy. Date:  1938. Vasectomy. Date: 1968. Vocal Cord Growth Excision. Date: 2000. Pacemaker Placement. Date: 07/07/2002.  Family History Cancer. mother and grandfather mothers side Diabetes Mellitus. father Father. Deceased, Myocardial infarction. age 86 Mother. Deceased, Colon Cancer. age 84, Lymphoma  Social History Drug/Alcohol Rehab (Currently). no Exercise. Exercises daily; does individual sport, other and gym / weights Illicit drug use. no Current work status. retired Alcohol use. former drinker Children. 3 Previously in rehab. no Tobacco / smoke exposure. no Tobacco use. former smoker; smoke(d) 3 or more pack(s) per day; uses 2 or more can(s) smokeless per week Pain Contract. no Living situation. live with spouse Marital status. married Number of flights of stairs before winded. 4-5 Post-Surgical Plans. Plan is to go home.  Review of Systems General:Not Present- Chills, Fever, Night Sweats, Fatigue, Weight Gain, Weight Loss and Memory Loss. Skin:Not Present- Hives, Itching, Rash, Eczema and Lesions. HEENT:Not Present- Tinnitus, Headache, Double Vision, Visual Loss, Hearing Loss and Dentures. Respiratory:Not Present- Shortness of breath with exertion, Shortness of breath at rest, Allergies, Coughing up blood and Chronic Cough. Cardiovascular:Not Present- Chest Pain, Racing/skipping heartbeats, Difficulty Breathing Lying Down, Murmur, Swelling and Palpitations. Gastrointestinal:Not Present- Bloody Stool, Heartburn, Abdominal Pain, Vomiting, Nausea, Constipation, Diarrhea, Difficulty Swallowing, Jaundice and Loss of appetitie. Male Genitourinary:Not Present- Urinary frequency, Blood in Urine, Weak urinary stream, Discharge, Flank Pain, Incontinence, Painful Urination, Urgency, Urinary Retention and Urinating at Night. Musculoskeletal:Present- Joint Pain and Back Pain. Not Present- Muscle Weakness, Muscle Pain, Joint Swelling, Morning Stiffness and  Spasms. Neurological:Not Present- Tremor, Dizziness, Blackout spells, Paralysis, Difficulty with balance and Weakness. Psychiatric:Not Present- Insomnia.  Vitals Weight: 228 lb Height: 74 in Body Surface Area: 2.32 m Body Mass Index: 29.27 kg/m Pulse: 56 (Regular) Resp.: 12 (Unlabored) BP: 138/58 (Sitting, Left Arm, Standard)  Physical Exam The physical exam findings are as follows: Patient is accompanied today by his wife.   General Mental Status - Alert, cooperative and good historian. General Appearance- pleasant. Not in acute distress. Orientation- Oriented X3. Build & Nutrition- Well nourished and Well developed.   Head and Neck Head- normocephalic, atraumatic . Neck Global Assessment- supple. no bruit auscultated on the right and no bruit auscultated on the left.   Eye Pupil- Bilateral- Regular and Round. Motion- Bilateral- EOMI.   ENMT  Bilateral hearing aids  Chest and Lung Exam Auscultation: Breath sounds:- clear at anterior chest wall and - clear at posterior chest wall. Adventitious sounds:- No Adventitious sounds.   Cardiovascular Auscultation:Rhythm- Regular rate and rhythm. Heart Sounds- S1 WNL and S2 WNL. Murmurs & Other Heart Sounds:Auscultation of the heart reveals - No Murmurs.   Abdomen Palpation/Percussion:Tenderness- Abdomen is non-tender to palpation. Rigidity (guarding)- Abdomen is soft. Auscultation:Auscultation of the abdomen reveals - Bowel sounds normal.   Male Genitourinary Not done, not pertinent to present illness  Musculoskeletal Evaluation of his hip shows a normal range of motion with no discomfort. The left knee shows no effusion. Range of motion is about 5-125 degrees. There is a slight crepitus on range of motion. No jointline tenderness or instability. The right knee shows about a 5-10 degree varus deformity. Range is about 10-125. Marked crepitus on range of motion. Tenderness  medial greater than lateral with no instability. No effusion. Pulse, sensation and motor are intact. He walks with an antalgic gait pattern.  Assessment & Plan Osteoarthritis Right Knee greater than Left Knee  Patient is for a Right Total Knee Replacement and a Left Knee Cortisone Injection by Dr.   Aluisio.  Plan is to go home.  PCP - Dr. Fransico Hodges Cards - Dr. Greg Taylor  Drew Rhya Shan, PA-C  

## 2011-12-11 ENCOUNTER — Ambulatory Visit (HOSPITAL_COMMUNITY): Payer: Medicare Other | Admitting: Anesthesiology

## 2011-12-11 ENCOUNTER — Encounter (HOSPITAL_COMMUNITY): Payer: Self-pay | Admitting: Anesthesiology

## 2011-12-11 ENCOUNTER — Inpatient Hospital Stay (HOSPITAL_COMMUNITY)
Admission: RE | Admit: 2011-12-11 | Discharge: 2011-12-13 | DRG: 470 | Disposition: A | Discharge status: Home Health | Payer: Medicare Other | Source: Ambulatory Visit | Attending: Orthopedic Surgery | Admitting: Orthopedic Surgery

## 2011-12-11 ENCOUNTER — Encounter (HOSPITAL_COMMUNITY): Payer: Self-pay

## 2011-12-11 ENCOUNTER — Encounter (HOSPITAL_COMMUNITY): Payer: Self-pay | Admitting: *Deleted

## 2011-12-11 ENCOUNTER — Encounter (HOSPITAL_COMMUNITY)
Admission: RE | Disposition: A | Discharge status: Home Health | Payer: Self-pay | Source: Ambulatory Visit | Attending: Orthopedic Surgery

## 2011-12-11 DIAGNOSIS — E785 Hyperlipidemia, unspecified: Secondary | ICD-10-CM | POA: Diagnosis present

## 2011-12-11 DIAGNOSIS — Z96659 Presence of unspecified artificial knee joint: Secondary | ICD-10-CM

## 2011-12-11 DIAGNOSIS — M1710 Unilateral primary osteoarthritis, unspecified knee: Principal | ICD-10-CM | POA: Diagnosis present

## 2011-12-11 DIAGNOSIS — I10 Essential (primary) hypertension: Secondary | ICD-10-CM

## 2011-12-11 DIAGNOSIS — E669 Obesity, unspecified: Secondary | ICD-10-CM | POA: Diagnosis present

## 2011-12-11 DIAGNOSIS — Z950 Presence of cardiac pacemaker: Secondary | ICD-10-CM

## 2011-12-11 DIAGNOSIS — K219 Gastro-esophageal reflux disease without esophagitis: Secondary | ICD-10-CM | POA: Diagnosis present

## 2011-12-11 HISTORY — PX: TOTAL KNEE ARTHROPLASTY: SHX125

## 2011-12-11 HISTORY — PX: STERIOD INJECTION: SHX5046

## 2011-12-11 LAB — TYPE AND SCREEN: ABO/RH(D): A POS

## 2011-12-11 SURGERY — ARTHROPLASTY, KNEE, TOTAL
Anesthesia: General | Site: Knee | Laterality: Right | Wound class: Clean

## 2011-12-11 MED ORDER — ACETAMINOPHEN 10 MG/ML IV SOLN
INTRAVENOUS | Status: AC
  Filled 2011-12-11: qty 100

## 2011-12-11 MED ORDER — MENTHOL 3 MG MT LOZG: 1.0000 | LOZENGE | OROMUCOSAL | Status: DC | PRN

## 2011-12-11 MED ORDER — MIDAZOLAM HCL 5 MG/5ML IJ SOLN
INTRAMUSCULAR | Status: DC | PRN
  Administered 2011-12-11: 2 mg via INTRAVENOUS

## 2011-12-11 MED ORDER — NALOXONE HCL 0.4 MG/ML IJ SOLN: 0.4000 mg | INTRAMUSCULAR | Status: DC | PRN

## 2011-12-11 MED ORDER — METHOCARBAMOL 100 MG/ML IJ SOLN
500.0000 mg | Freq: Four times a day (QID) | INTRAVENOUS | Status: DC | PRN
  Administered 2011-12-11: 500 mg via INTRAVENOUS
  Filled 2011-12-11: qty 5

## 2011-12-11 MED ORDER — PROPOFOL 10 MG/ML IV BOLUS
INTRAVENOUS | Status: DC | PRN
  Administered 2011-12-11: 200 mg via INTRAVENOUS

## 2011-12-11 MED ORDER — ONDANSETRON HCL 4 MG/2ML IJ SOLN: 4.0000 mg | Freq: Four times a day (QID) | INTRAMUSCULAR | Status: DC | PRN

## 2011-12-11 MED ORDER — METOCLOPRAMIDE HCL 10 MG PO TABS: 5.0000 mg | ORAL_TABLET | Freq: Three times a day (TID) | ORAL | Status: DC | PRN

## 2011-12-11 MED ORDER — FENTANYL CITRATE 0.05 MG/ML IJ SOLN
INTRAMUSCULAR | Status: DC | PRN
  Administered 2011-12-11: 100 ug via INTRAVENOUS
  Administered 2011-12-11: 50 ug via INTRAVENOUS
  Administered 2011-12-11: 100 ug via INTRAVENOUS

## 2011-12-11 MED ORDER — METHYLPREDNISOLONE ACETATE 40 MG/ML IJ SUSP
INTRAMUSCULAR | Status: AC
  Filled 2011-12-11: qty 5

## 2011-12-11 MED ORDER — SODIUM CHLORIDE 0.9 % IJ SOLN: 9.0000 mL | INTRAMUSCULAR | Status: DC | PRN

## 2011-12-11 MED ORDER — ACETAMINOPHEN 650 MG RE SUPP: 650.0000 mg | Freq: Four times a day (QID) | RECTAL | Status: DC | PRN

## 2011-12-11 MED ORDER — DIPHENHYDRAMINE HCL 12.5 MG/5ML PO ELIX: 12.5000 mg | ORAL_SOLUTION | Freq: Four times a day (QID) | ORAL | Status: DC | PRN

## 2011-12-11 MED ORDER — LIDOCAINE HCL 1 % IJ SOLN
INTRAMUSCULAR | Status: AC
  Filled 2011-12-11: qty 20

## 2011-12-11 MED ORDER — DIPHENHYDRAMINE HCL 50 MG/ML IJ SOLN: 12.5000 mg | Freq: Four times a day (QID) | INTRAMUSCULAR | Status: DC | PRN

## 2011-12-11 MED ORDER — METHYLPREDNISOLONE ACETATE 40 MG/ML IJ SUSP
INTRAMUSCULAR | Status: DC | PRN
  Administered 2011-12-11: 40 mg

## 2011-12-11 MED ORDER — METHOCARBAMOL 500 MG PO TABS
500.0000 mg | ORAL_TABLET | Freq: Four times a day (QID) | ORAL | Status: DC | PRN
  Administered 2011-12-12 – 2011-12-13 (×3): 500 mg via ORAL
  Filled 2011-12-11 (×3): qty 1

## 2011-12-11 MED ORDER — BISACODYL 10 MG RE SUPP: 10.0000 mg | Freq: Every day | RECTAL | Status: DC | PRN

## 2011-12-11 MED ORDER — TEMAZEPAM 15 MG PO CAPS: 15.0000 mg | ORAL_CAPSULE | Freq: Every evening | ORAL | Status: DC | PRN

## 2011-12-11 MED ORDER — METOCLOPRAMIDE HCL 5 MG/ML IJ SOLN: 5.0000 mg | Freq: Three times a day (TID) | INTRAMUSCULAR | Status: DC | PRN

## 2011-12-11 MED ORDER — IRBESARTAN 150 MG PO TABS
150.0000 mg | ORAL_TABLET | Freq: Every day | ORAL | Status: DC
  Administered 2011-12-12 – 2011-12-13 (×2): 150 mg via ORAL
  Filled 2011-12-11 (×2): qty 1

## 2011-12-11 MED ORDER — ONDANSETRON HCL 4 MG PO TABS: 4.0000 mg | ORAL_TABLET | Freq: Four times a day (QID) | ORAL | Status: DC | PRN

## 2011-12-11 MED ORDER — ONDANSETRON HCL 4 MG/2ML IJ SOLN
INTRAMUSCULAR | Status: DC | PRN
  Administered 2011-12-11: 4 mg via INTRAVENOUS

## 2011-12-11 MED ORDER — DIPHENHYDRAMINE HCL 12.5 MG/5ML PO ELIX: 12.5000 mg | ORAL_SOLUTION | ORAL | Status: DC | PRN

## 2011-12-11 MED ORDER — PROMETHAZINE HCL 25 MG/ML IJ SOLN: 6.2500 mg | INTRAMUSCULAR | Status: DC | PRN

## 2011-12-11 MED ORDER — HYDROMORPHONE HCL PF 1 MG/ML IJ SOLN: 0.2500 mg | INTRAMUSCULAR | Status: DC | PRN

## 2011-12-11 MED ORDER — MORPHINE SULFATE (PF) 1 MG/ML IV SOLN
INTRAVENOUS | Status: DC
  Administered 2011-12-11: 14:00:00 via INTRAVENOUS
  Administered 2011-12-12: 3 mg via INTRAVENOUS

## 2011-12-11 MED ORDER — NEOSTIGMINE METHYLSULFATE 1 MG/ML IJ SOLN
INTRAMUSCULAR | Status: DC | PRN
  Administered 2011-12-11: 4 mg via INTRAVENOUS

## 2011-12-11 MED ORDER — FLEET ENEMA 7-19 GM/118ML RE ENEM: 1.0000 | ENEMA | Freq: Once | RECTAL | Status: AC | PRN

## 2011-12-11 MED ORDER — LACTATED RINGERS IV SOLN
INTRAVENOUS | Status: DC
  Administered 2011-12-11: 1000 mL via INTRAVENOUS
  Administered 2011-12-11: 13:00:00 via INTRAVENOUS

## 2011-12-11 MED ORDER — CEFAZOLIN SODIUM 1-5 GM-% IV SOLN
1.0000 g | Freq: Four times a day (QID) | INTRAVENOUS | Status: AC
  Administered 2011-12-11 – 2011-12-12 (×3): 1 g via INTRAVENOUS
  Filled 2011-12-11 (×3): qty 50

## 2011-12-11 MED ORDER — POTASSIUM CHLORIDE IN NACL 20-0.9 MEQ/L-% IV SOLN
INTRAVENOUS | Status: DC
  Administered 2011-12-11: 22:00:00 via INTRAVENOUS
  Administered 2011-12-12: 20 mL/h via INTRAVENOUS
  Filled 2011-12-11 (×3): qty 1000

## 2011-12-11 MED ORDER — HYDROCHLOROTHIAZIDE 25 MG PO TABS
25.0000 mg | ORAL_TABLET | Freq: Every day | ORAL | Status: DC
  Administered 2011-12-12 – 2011-12-13 (×2): 25 mg via ORAL
  Filled 2011-12-11 (×2): qty 1

## 2011-12-11 MED ORDER — BUPIVACAINE ON-Q PAIN PUMP (FOR ORDER SET NO CHG)
INJECTION | Status: DC
  Filled 2011-12-11: qty 1

## 2011-12-11 MED ORDER — VALSARTAN-HYDROCHLOROTHIAZIDE 160-25 MG PO TABS: 1.0000 | ORAL_TABLET | Freq: Every day | ORAL | Status: DC

## 2011-12-11 MED ORDER — MORPHINE SULFATE (PF) 1 MG/ML IV SOLN
INTRAVENOUS | Status: AC
  Filled 2011-12-11: qty 25

## 2011-12-11 MED ORDER — BUPIVACAINE 0.25 % ON-Q PUMP SINGLE CATH 300ML
INJECTION | Status: DC | PRN
  Administered 2011-12-11: 300 mL

## 2011-12-11 MED ORDER — ROCURONIUM BROMIDE 100 MG/10ML IV SOLN
INTRAVENOUS | Status: DC | PRN
  Administered 2011-12-11: 40 mg via INTRAVENOUS

## 2011-12-11 MED ORDER — GLYCOPYRROLATE 0.2 MG/ML IJ SOLN
INTRAMUSCULAR | Status: DC | PRN
  Administered 2011-12-11: .8 mg via INTRAVENOUS

## 2011-12-11 MED ORDER — RIVAROXABAN 10 MG PO TABS
10.0000 mg | ORAL_TABLET | Freq: Every day | ORAL | Status: DC
  Administered 2011-12-12 – 2011-12-13 (×2): 10 mg via ORAL
  Filled 2011-12-11 (×4): qty 1

## 2011-12-11 MED ORDER — OXYCODONE HCL 5 MG PO TABS
5.0000 mg | ORAL_TABLET | ORAL | Status: DC | PRN
  Administered 2011-12-12: 5 mg via ORAL
  Administered 2011-12-13: 10 mg via ORAL
  Filled 2011-12-11: qty 1
  Filled 2011-12-11: qty 2

## 2011-12-11 MED ORDER — HYDROMORPHONE HCL PF 1 MG/ML IJ SOLN
INTRAMUSCULAR | Status: DC | PRN
  Administered 2011-12-11 (×2): 0.5 mg via INTRAVENOUS

## 2011-12-11 MED ORDER — PHENOL 1.4 % MT LIQD: 1.0000 | OROMUCOSAL | Status: DC | PRN

## 2011-12-11 MED ORDER — ACETAMINOPHEN 10 MG/ML IV SOLN
INTRAVENOUS | Status: DC | PRN
  Administered 2011-12-11: 1000 mg via INTRAVENOUS

## 2011-12-11 MED ORDER — ACETAMINOPHEN 10 MG/ML IV SOLN
1000.0000 mg | Freq: Once | INTRAVENOUS | Status: DC
  Filled 2011-12-11: qty 100

## 2011-12-11 MED ORDER — DEXAMETHASONE SODIUM PHOSPHATE 10 MG/ML IJ SOLN
10.0000 mg | Freq: Once | INTRAMUSCULAR | Status: DC
  Filled 2011-12-11: qty 1

## 2011-12-11 MED ORDER — CEFAZOLIN SODIUM-DEXTROSE 2-3 GM-% IV SOLR
INTRAVENOUS | Status: AC
  Filled 2011-12-11: qty 50

## 2011-12-11 MED ORDER — SODIUM CHLORIDE 0.9 % IV SOLN: INTRAVENOUS | Status: DC

## 2011-12-11 MED ORDER — SODIUM CHLORIDE 0.9 % IR SOLN
Status: DC | PRN
  Administered 2011-12-11: 1000 mL

## 2011-12-11 MED ORDER — LIDOCAINE HCL 1 % IJ SOLN
INTRAMUSCULAR | Status: DC | PRN
  Administered 2011-12-11: 3 mL

## 2011-12-11 MED ORDER — ACETAMINOPHEN 10 MG/ML IV SOLN
1000.0000 mg | Freq: Four times a day (QID) | INTRAVENOUS | Status: AC
  Administered 2011-12-11 – 2011-12-12 (×4): 1000 mg via INTRAVENOUS
  Filled 2011-12-11 (×6): qty 100

## 2011-12-11 MED ORDER — SIMVASTATIN 20 MG PO TABS
20.0000 mg | ORAL_TABLET | Freq: Every day | ORAL | Status: DC
  Administered 2011-12-11 – 2011-12-12 (×2): 20 mg via ORAL
  Filled 2011-12-11 (×3): qty 1

## 2011-12-11 MED ORDER — DEXAMETHASONE SODIUM PHOSPHATE 10 MG/ML IJ SOLN
INTRAMUSCULAR | Status: DC | PRN
  Administered 2011-12-11: 10 mg via INTRAVENOUS

## 2011-12-11 MED ORDER — SODIUM CHLORIDE 0.9 % IR SOLN
Status: DC | PRN
  Administered 2011-12-11: 3000 mL

## 2011-12-11 MED ORDER — DOCUSATE SODIUM 100 MG PO CAPS
100.0000 mg | ORAL_CAPSULE | Freq: Two times a day (BID) | ORAL | Status: DC
  Administered 2011-12-11 – 2011-12-13 (×5): 100 mg via ORAL

## 2011-12-11 MED ORDER — CEFAZOLIN SODIUM 1-5 GM-% IV SOLN
INTRAVENOUS | Status: DC | PRN
  Administered 2011-12-11: 2 g via INTRAVENOUS

## 2011-12-11 MED ORDER — ACETAMINOPHEN 325 MG PO TABS: 650.0000 mg | ORAL_TABLET | Freq: Four times a day (QID) | ORAL | Status: DC | PRN

## 2011-12-11 MED ORDER — LIDOCAINE HCL (CARDIAC) 20 MG/ML IV SOLN
INTRAVENOUS | Status: DC | PRN
  Administered 2011-12-11: 50 mg via INTRAVENOUS

## 2011-12-11 MED ORDER — POLYETHYLENE GLYCOL 3350 17 G PO PACK: 17.0000 g | PACK | Freq: Every day | ORAL | Status: DC | PRN

## 2011-12-11 SURGICAL SUPPLY — 53 items
BAG ZIPLOCK 12X15 (MISCELLANEOUS) ×3 IMPLANT
BANDAGE ELASTIC 6 VELCRO ST LF (GAUZE/BANDAGES/DRESSINGS) ×3 IMPLANT
BANDAGE ESMARK 6X9 LF (GAUZE/BANDAGES/DRESSINGS) ×2 IMPLANT
BLADE SAG 18X100X1.27 (BLADE) ×3 IMPLANT
BLADE SAW SGTL 11.0X1.19X90.0M (BLADE) ×3 IMPLANT
BNDG ESMARK 6X9 LF (GAUZE/BANDAGES/DRESSINGS) ×3
BOWL SMART MIX CTS (DISPOSABLE) ×3 IMPLANT
CATH KIT ON-Q SILVERSOAK 5IN (CATHETERS) ×3 IMPLANT
CEMENT HV SMART SET (Cement) ×6 IMPLANT
CLOTH BEACON ORANGE TIMEOUT ST (SAFETY) ×3 IMPLANT
CUFF TOURN SGL QUICK 34 (TOURNIQUET CUFF) ×1
CUFF TRNQT CYL 34X4X40X1 (TOURNIQUET CUFF) ×2 IMPLANT
DRAPE EXTREMITY T 121X128X90 (DRAPE) ×3 IMPLANT
DRAPE POUCH INSTRU U-SHP 10X18 (DRAPES) ×3 IMPLANT
DRAPE U-SHAPE 47X51 STRL (DRAPES) ×3 IMPLANT
DRSG ADAPTIC 3X8 NADH LF (GAUZE/BANDAGES/DRESSINGS) ×3 IMPLANT
DRSG PAD ABDOMINAL 8X10 ST (GAUZE/BANDAGES/DRESSINGS) ×3 IMPLANT
DURAPREP 26ML APPLICATOR (WOUND CARE) ×3 IMPLANT
ELECT REM PT RETURN 9FT ADLT (ELECTROSURGICAL) ×3
ELECTRODE REM PT RTRN 9FT ADLT (ELECTROSURGICAL) ×2 IMPLANT
EVACUATOR 1/8 PVC DRAIN (DRAIN) ×3 IMPLANT
FACESHIELD LNG OPTICON STERILE (SAFETY) ×15 IMPLANT
GLOVE BIO SURGEON STRL SZ7.5 (GLOVE) ×3 IMPLANT
GLOVE BIO SURGEON STRL SZ8 (GLOVE) ×3 IMPLANT
GLOVE BIOGEL PI IND STRL 8 (GLOVE) ×4 IMPLANT
GLOVE BIOGEL PI INDICATOR 8 (GLOVE) ×2
GOWN STRL NON-REIN LRG LVL3 (GOWN DISPOSABLE) ×3 IMPLANT
GOWN STRL REIN XL XLG (GOWN DISPOSABLE) ×3 IMPLANT
HANDPIECE INTERPULSE COAX TIP (DISPOSABLE) ×1
IMMOBILIZER KNEE 20 (SOFTGOODS) ×3
IMMOBILIZER KNEE 20 THIGH 36 (SOFTGOODS) ×2 IMPLANT
KIT BASIN OR (CUSTOM PROCEDURE TRAY) ×3 IMPLANT
MANIFOLD NEPTUNE II (INSTRUMENTS) ×3 IMPLANT
NS IRRIG 1000ML POUR BTL (IV SOLUTION) ×3 IMPLANT
PACK TOTAL JOINT (CUSTOM PROCEDURE TRAY) ×3 IMPLANT
PAD ABD 7.5X8 STRL (GAUZE/BANDAGES/DRESSINGS) ×3 IMPLANT
PADDING CAST ABS 6INX4YD NS (CAST SUPPLIES) ×1
PADDING CAST ABS COTTON 6X4 NS (CAST SUPPLIES) ×2 IMPLANT
PADDING CAST COTTON 6X4 STRL (CAST SUPPLIES) ×9 IMPLANT
POSITIONER SURGICAL ARM (MISCELLANEOUS) ×3 IMPLANT
SET HNDPC FAN SPRY TIP SCT (DISPOSABLE) ×2 IMPLANT
SPONGE GAUZE 4X4 12PLY (GAUZE/BANDAGES/DRESSINGS) ×3 IMPLANT
STRIP CLOSURE SKIN 1/2X4 (GAUZE/BANDAGES/DRESSINGS) ×6 IMPLANT
SUCTION FRAZIER 12FR DISP (SUCTIONS) ×3 IMPLANT
SUT MNCRL AB 4-0 PS2 18 (SUTURE) ×3 IMPLANT
SUT PDS AB 1 CT1 27 (SUTURE) ×9 IMPLANT
SUT VIC AB 2-0 CT1 27 (SUTURE) ×3
SUT VIC AB 2-0 CT1 TAPERPNT 27 (SUTURE) ×6 IMPLANT
SUT VLOC 180 0 24IN GS25 (SUTURE) ×3 IMPLANT
TOWEL OR 17X26 10 PK STRL BLUE (TOWEL DISPOSABLE) ×6 IMPLANT
TRAY FOLEY CATH 14FRSI W/METER (CATHETERS) ×3 IMPLANT
WATER STERILE IRR 1500ML POUR (IV SOLUTION) ×3 IMPLANT
WRAP KNEE MAXI GEL POST OP (GAUZE/BANDAGES/DRESSINGS) ×6 IMPLANT

## 2011-12-11 NOTE — Progress Notes (Signed)
Utilization review completed.  

## 2011-12-11 NOTE — H&P (View-Only) (Signed)
Maurice Hall  DOB: [birth year] Married / Language: English / Race: White Male  Date of Admission:  12/11/2011  Chief Complaint:  Right Knee Pain  History of Present Illness The patient is a 78 year old male who comes in for a preoperative History and Physical. The patient is scheduled for a right total knee arthroplasty to be performed by Dr. Gus Rankin. Aluisio, MD at [facility] on 12/11/2011. The patient is a 78 year old male who presents with knee complaints. The patient reports right knee (worse than left) symptoms including: pain, swelling, catching, giving way, weakness and stiffness which began 3 month(s) (but he has a history of previous knee issues) ago without any known injury.The patient feels that the symptoms are worsening (had scopes by both Dr. Fannie Knee and Dr. Thomasena Edis over 10 years ago). Prior to being seen today the patient was previously evaluated in this clinic. Current treatment includes nonsteroidal anti-inflammatory drugs (Mobic). 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 He states that "the adrenaline was pumping" during the games so he did not notice the pain but he would be in considerable pain after the games. He states that since the season ended he is finding it very difficult to walk. At the current time the right knee is hurting him more, but his left knee is also painful. He had a cortisone injection in the left knee about 5 years ago with relief a couple months. He has never had cortisone injection in the right knee. He has never had viscosupplementation. He states that he would prefer to have his knee replaced as opposed to a temporary solution with injections. Maurice Hall is a well developed male who is alert and oriented. He is in no apparent distress. Evaluation of his hip shows a normal range of motion with no discomfort. The left knee shows no effusion. Range of motion is about 5-125 degrees. There is a slight crepitus on range of  motion. No jointline tenderness or instability. The right knee shows about a 5-10 degree varus deformity. Range is about 10-125. Marked crepitus on range of motion. Tenderness medial greater than lateral with no instability. No effusion. Pulse, sensation and motor are intact. He walks with an antalgic gait pattern. Ready to proceed with surgery. They have been treated conservatively in the past for the above stated problem and despite conservative measures, they continue to have progressive pain and severe functional limitations and dysfunction. They have failed non-operative management including home exercise, medications, and injections. It is felt that they would benefit from undergoing total joint replacement. Risks and benefits of the procedure have been discussed with the patient and they elect to proceed with surgery. There are no active contraindications to surgery such as ongoing infection or rapidly progressive neurological disease.  Allergies No Known Drug Allergies  Medication History Losartan Potassium-HCTZ (100-25MG  Tablet, Oral daily) Active. Mobic (7.5MG  Tablet, Oral daily) Active. Simvastatin (20MG  Tablet, Oral daily) Active. Aspirin Adult Low Strength (81MG  Tablet DR, Oral daily) Active. Fish Oil (5000mg  daily) Active. Osteo Bi-Flex Adv Double St (2 Oral daily) Active.  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 11/19/1992 Osteoarthrosis NOS, lower leg (715.96). 06/25/1996 Bradycardia (427.89). Pacemaker Placement Hypercholesterolemia Chronic Pain Gastroesophageal Reflux Disease High blood pressure Impaired Hearing Tinnitus Pacemaker Syncope. Secondary to Bradycardia  Past Surgical History Cataract Surgery. bilateral, Lens Implants Rotator Cuff Repair. right Appendectomy. Date: 06/10/1945. Arthroscopy of Knee. bilateral; Left - 1968, Right - 1975 Arthroscopy  of Shoulder. Date: 1999. right Tonsillectomy. Date:  46. Vasectomy. Date: 68. Vocal Cord Growth Excision. Date: 2000. Pacemaker Placement. Date: 07/07/2002.  Family History Cancer. mother and grandfather mothers side Diabetes Mellitus. father Father. Deceased, Myocardial infarction. age 61 Mother. Deceased, Colon Cancer. age 9, Lymphoma  Social History Drug/Alcohol Rehab (Currently). no Exercise. Exercises daily; does individual sport, other and gym / weights Illicit drug use. no Current work status. retired Alcohol use. former drinker Children. 3 Previously in rehab. no Tobacco / smoke exposure. no Tobacco use. former smoker; smoke(d) 3 or more pack(s) per day; uses 2 or more can(s) smokeless per week Pain Contract. no Living situation. live with spouse Marital status. married Number of flights of stairs before winded. 4-5 Post-Surgical Plans. Plan is to go home.  Review of Systems General:Not Present- Chills, Fever, Night Sweats, Fatigue, Weight Gain, Weight Loss and Memory Loss. Skin:Not Present- Hives, Itching, Rash, Eczema and Lesions. HEENT:Not Present- Tinnitus, Headache, Double Vision, Visual Loss, Hearing Loss and Dentures. Respiratory:Not Present- Shortness of breath with exertion, Shortness of breath at rest, Allergies, Coughing up blood and Chronic Cough. Cardiovascular:Not Present- Chest Pain, Racing/skipping heartbeats, Difficulty Breathing Lying Down, Murmur, Swelling and Palpitations. Gastrointestinal:Not Present- Bloody Stool, Heartburn, Abdominal Pain, Vomiting, Nausea, Constipation, Diarrhea, Difficulty Swallowing, Jaundice and Loss of appetitie. Male Genitourinary:Not Present- Urinary frequency, Blood in Urine, Weak urinary stream, Discharge, Flank Pain, Incontinence, Painful Urination, Urgency, Urinary Retention and Urinating at Night. Musculoskeletal:Present- Joint Pain and Back Pain. Not Present- Muscle Weakness, Muscle Pain, Joint Swelling, Morning Stiffness and  Spasms. Neurological:Not Present- Tremor, Dizziness, Blackout spells, Paralysis, Difficulty with balance and Weakness. Psychiatric:Not Present- Insomnia.  Vitals Weight: 228 lb Height: 74 in Body Surface Area: 2.32 m Body Mass Index: 29.27 kg/m Pulse: 56 (Regular) Resp.: 12 (Unlabored) BP: 138/58 (Sitting, Left Arm, Standard)  Physical Exam The physical exam findings are as follows: Patient is accompanied today by his wife.   General Mental Status - Alert, cooperative and good historian. General Appearance- pleasant. Not in acute distress. Orientation- Oriented X3. Build & Nutrition- Well nourished and Well developed.   Head and Neck Head- normocephalic, atraumatic . Neck Global Assessment- supple. no bruit auscultated on the right and no bruit auscultated on the left.   Eye Pupil- Bilateral- Regular and Round. Motion- Bilateral- EOMI.   ENMT  Bilateral hearing aids  Chest and Lung Exam Auscultation: Breath sounds:- clear at anterior chest wall and - clear at posterior chest wall. Adventitious sounds:- No Adventitious sounds.   Cardiovascular Auscultation:Rhythm- Regular rate and rhythm. Heart Sounds- S1 WNL and S2 WNL. Murmurs & Other Heart Sounds:Auscultation of the heart reveals - No Murmurs.   Abdomen Palpation/Percussion:Tenderness- Abdomen is non-tender to palpation. Rigidity (guarding)- Abdomen is soft. Auscultation:Auscultation of the abdomen reveals - Bowel sounds normal.   Male Genitourinary Not done, not pertinent to present illness  Musculoskeletal Evaluation of his hip shows a normal range of motion with no discomfort. The left knee shows no effusion. Range of motion is about 5-125 degrees. There is a slight crepitus on range of motion. No jointline tenderness or instability. The right knee shows about a 5-10 degree varus deformity. Range is about 10-125. Marked crepitus on range of motion. Tenderness  medial greater than lateral with no instability. No effusion. Pulse, sensation and motor are intact. He walks with an antalgic gait pattern.  Assessment & Plan Osteoarthritis Right Knee greater than Left Knee  Patient is for a Right Total Knee Replacement and a Left Knee Cortisone Injection by Dr.  Aluisio.  Plan is to go home.  PCP - Dr. Jetty Duhamel Cards - Dr. Sharrell Ku  Avel Peace, PA-C

## 2011-12-11 NOTE — Anesthesia Preprocedure Evaluation (Addendum)
Anesthesia Evaluation    Airway Mallampati: II TM Distance: >3 FB Neck ROM: Full    Dental No notable dental hx.    Pulmonary  breath sounds clear to auscultation  Pulmonary exam normal       Cardiovascular hypertension, Pt. on medications - dysrhythmias + pacemaker Rhythm:Regular Rate:Normal  H/O bradycardia.   Neuro/Psych    GI/Hepatic GERD-  ,  Endo/Other    Renal/GU      Musculoskeletal   Abdominal (+) + obese,   Peds  Hematology   Anesthesia Other Findings   Reproductive/Obstetrics                           Anesthesia Physical Anesthesia Plan  ASA: III  Anesthesia Plan: General   Post-op Pain Management:    Induction: Intravenous  Airway Management Planned: Oral ETT  Additional Equipment:   Intra-op Plan:   Post-operative Plan: Extubation in OR  Informed Consent: I have reviewed the patients History and Physical, chart, labs and discussed the procedure including the risks, benefits and alternatives for the proposed anesthesia with the patient or authorized representative who has indicated his/her understanding and acceptance.   Dental advisory given  Plan Discussed with: CRNA  Anesthesia Plan Comments: (Discussed r/b general versus spinal and he prefers general. Pacemaker prescription reviewed.)       Anesthesia Quick Evaluation

## 2011-12-11 NOTE — Interval H&P Note (Signed)
History and Physical Interval Note:  12/11/2011 12:07 PM  Maurice Hall  has presented today for surgery, with the diagnosis of osteoarthritis right knee  The various methods of treatment have been discussed with the patient and family. After consideration of risks, benefits and other options for treatment, the patient has consented to  Procedure(s) (LRB): TOTAL KNEE ARTHROPLASTY (Right) as a surgical intervention .  The patients' history has been reviewed, patient examined, no change in status, stable for surgery.  I have reviewed the patients' chart and labs.  Questions were answered to the patient's satisfaction.     Loanne Drilling

## 2011-12-11 NOTE — Transfer of Care (Signed)
Immediate Anesthesia Transfer of Care Note  Patient: Maurice Hall  Procedure(s) Performed: Procedure(s) (LRB): TOTAL KNEE ARTHROPLASTY (Right) STEROID INJECTION (Left) ARTHROCENTESIS ASPIRATION LARGE JOINT (Left)  Patient Location: PACU  Anesthesia Type: General  Level of Consciousness: sedated, patient cooperative and responds to stimulaton  Airway & Oxygen Therapy: Patient Spontanous Breathing and Patient connected to face mask oxgen  Post-op Assessment: Report given to PACU RN and Post -op Vital signs reviewed and stable  Post vital signs: Reviewed and stable  Complications: No apparent anesthesia complications

## 2011-12-11 NOTE — Anesthesia Postprocedure Evaluation (Signed)
  Anesthesia Post-op Note  Patient: Maurice Hall  Procedure(s) Performed: Procedure(s) (LRB): TOTAL KNEE ARTHROPLASTY (Right) STEROID INJECTION (Left) ARTHROCENTESIS ASPIRATION LARGE JOINT (Left)  Patient Location: PACU  Anesthesia Type: General  Level of Consciousness: awake and alert   Airway and Oxygen Therapy: Patient Spontanous Breathing  Post-op Pain: mild  Post-op Assessment: Post-op Vital signs reviewed, Patient's Cardiovascular Status Stable, Respiratory Function Stable, Patent Airway and No signs of Nausea or vomiting  Post-op Vital Signs: stable  Complications: No apparent anesthesia complications

## 2011-12-11 NOTE — Op Note (Addendum)
Pre-operative diagnosis- Osteoarthritis  Bilateral knee(s)  Post-operative diagnosis- Osteoarthritis Bilateral knee(s)  Procedure- 1. Right  Total Knee Arthroplasty   2. Left knee aspiration and cortisone injection  Surgeon- Gus Rankin. Cabell Lazenby, MD  Assistant- Avel Peace, PA-C   Anesthesia-  General EBL-* No blood loss amount entered *  Drains Hemovac  Tourniquet time- 38 minutes @300  mmHg Complications- None  Condition-PACU - hemodynamically stable.   Brief Clinical Note  Maurice Hall is a 78 y.o. year old male with end stage OA of his right knee with progressively worsening pain and dysfunction. He has constant pain, with activity and at rest and significant functional deficits with difficulties even with ADLs. He has had extensive non-op management including analgesics, injections of cortisone, and home exercise program, but remains in significant pain with significant dysfunction. Radiographs show bone on bone arthritis medial and patellofemoral compartments with varus deformity and tibial subluxation. He presents now for right Total Knee Arthroplasty.  He also has significant left knee arthritis with an effusion and will undergo an aspiration and cortisone injection of the left knee.  Procedure in detail---   The patient is brought into the operating room and positioned supine on the operating table. After successful administration of  General,   a tourniquet is placed high on the  Right thigh(s) and the lower extremity is prepped and draped in the usual sterile fashion. Time out is performed by the operating team and then the  Right lower extremity is wrapped in Esmarch, knee flexed and the tourniquet inflated to 300 mmHg.       A midline incision is made with a ten blade through the subcutaneous tissue to the level of the extensor mechanism. A fresh blade is used to make a medial parapatellar arthrotomy. Soft tissue over the proximal medial tibia is subperiosteally elevated to the  joint line with a knife and into the semimembranosus bursa with a Cobb elevator. Soft tissue over the proximal lateral tibia is elevated with attention being paid to avoiding the patellar tendon on the tibial tubercle. The patella is everted, knee flexed 90 degrees and the ACL and PCL are removed. Findings are bone on bone medial and patellofemoral with large osteophytes.        The drill is used to create a starting hole in the distal femur and the canal is thoroughly irrigated with sterile saline to remove the fatty contents. The 5 degree Right  valgus alignment guide is placed into the femoral canal and the distal femoral cutting block is pinned to remove 11 mm off the distal femur. Resection is made with an oscillating saw.      The tibia is subluxed forward and the menisci are removed. The extramedullary alignment guide is placed referencing proximally at the medial aspect of the tibial tubercle and distally along the second metatarsal axis and tibial crest. The block is pinned to remove 2mm off the more deficient medial  side. Resection is made with an oscillating saw. Size 5is the most appropriate size for the tibia and the proximal tibia is prepared with the modular drill and keel punch for that size.      The femoral sizing guide is placed and size 5 is most appropriate. Rotation is marked off the epicondylar axis and confirmed by creating a rectangular flexion gap at 90 degrees. The size 5 cutting block is pinned in this rotation and the anterior, posterior and chamfer cuts are made with the oscillating saw. The intercondylar block is then  placed and that cut is made.      Trial size 5 tibial component, trial size 5 posterior stabilized femur and a 12.5  mm posterior stabilized rotating platform insert trial is placed. Full extension is achieved with excellent varus/valgus and anterior/posterior balance throughout full range of motion. The patella is everted and thickness measured to be 27  mm. Free  hand resection is taken to 15 mm, a 41 template is placed, lug holes are drilled, trial patella is placed, and it tracks normally. Osteophytes are removed off the posterior femur with the trial in place. All trials are removed and the cut bone surfaces prepared with pulsatile lavage. Cement is mixed and once ready for implantation, the size 5 tibial implant, size  5 posterior stabilized femoral component, and the size 41 patella are cemented in place and the patella is held with the clamp. The trial insert is placed and the knee held in full extension. All extruded cement is removed and once the cement is hard the permanent 12.5 mm posterior stabilized rotating platform insert is placed into the tibial tray.      The wound is copiously irrigated with saline solution and the extensor mechanism closed over a hemovac drain with #1 PDS suture. The tourniquet is released for a total tourniquet time of 38  minutes. Flexion against gravity is 135 degrees and the patella tracks normally. Subcutaneous tissue is closed with 2.0 vicryl and subcuticular with running 4.0 Monocryl. The catheter for the Marcaine pain pump is placed and the pump is initiated. The incision is cleaned and dried and steri-strips and a bulky sterile dressing are applied. The limb is placed into a knee immobilizer.      The left knee is sterilly prepped and the joint is aspirated, removing 60 ml of clear synovial fluid. A combination of 3 ml 1% lidocaine and 80 mg Depomedrol is then injected into the knee. The patient tolerated this well and he is awakened and transported to recovery in stable condition.      Please note that a surgical assistant was a medical necessity for this procedure in order to perform it in a safe and expeditious manner. Surgical assistant was necessary to retract the ligaments and vital neurovascular structures to prevent injury to them and also necessary for proper positioning of the limb to allow for anatomic placement of  the prosthesis.   Gus Rankin Kristyna Bradstreet, MD    12/11/2011, 1:22 PM

## 2011-12-11 NOTE — Progress Notes (Signed)
Bandaid intact to lt knee.

## 2011-12-12 ENCOUNTER — Encounter (HOSPITAL_COMMUNITY): Payer: Self-pay | Admitting: Orthopedic Surgery

## 2011-12-12 LAB — CBC
Hemoglobin: 13.9 g/dL (ref 13.0–17.0)
Platelets: 160 10*3/uL (ref 150–400)
RBC: 4.26 MIL/uL (ref 4.22–5.81)
WBC: 11 10*3/uL — ABNORMAL HIGH (ref 4.0–10.5)

## 2011-12-12 LAB — BASIC METABOLIC PANEL
CO2: 26 mEq/L (ref 19–32)
Calcium: 9 mg/dL (ref 8.4–10.5)
Chloride: 101 mEq/L (ref 96–112)
Potassium: 4.1 mEq/L (ref 3.5–5.1)
Sodium: 136 mEq/L (ref 135–145)

## 2011-12-12 MED ORDER — MORPHINE SULFATE 2 MG/ML IJ SOLN: 1.0000 mg | INTRAMUSCULAR | Status: DC | PRN

## 2011-12-12 NOTE — Progress Notes (Signed)
Physical Therapy Treatment Patient Details Name: Maurice Hall MRN: [id number] DOB: Feb 12, 1931 Today's Date: 12/12/2011 Time: 4098-1191 PT Time Calculation (min): 26 min  PT Assessment / Plan / Recommendation Comments on Treatment Session  pt continues to progress w/ min. pain of R knee. pt hopes to DC 5/8    Follow Up Recommendations  Home health PT;Supervision/Assistance - 24 hour    Equipment Recommendations  Rolling walker with 5" wheels;3 in 1 bedside comode    Frequency 7X/week   Plan Discharge plan remains appropriate    Precautions / Restrictions Precautions Precautions: Knee Required Braces or Orthoses: Knee Immobilizer - Right Knee Immobilizer - Right: Discontinue once straight leg raise with < 10 degree lag   Pertinent Vitals/Pain 1/10 knee (R)    Mobility  Bed Mobility Bed Mobility: Sit to Supine Sit to Supine: 4: Min guard Details for Bed Mobility Assistance: vc tp place RLE forward and reach back to surface Transfers Transfers: Sit to Stand;Stand to Sit Sit to Stand: From chair/3-in-1;With upper extremity assist;4: Min guard Stand to Sit: 4: Min guard;To bed;With upper extremity assist Details for Transfer Assistance: VC fror hand placement Ambulation/Gait Ambulation/Gait Assistance: 4: Min guard Ambulation Distance (Feet): 400 Feet Assistive device: Rolling walker Ambulation/Gait Assistance Details: vc for posture, no limp at all,  Gait Pattern: Step-through pattern Gait velocity: good speed General Gait Details: pt progressing well w/ no pain    Exercises     PT Goals Acute Rehab PT Goals PT Goal Formulation: With patient Time For Goal Achievement: 12/19/11 Potential to Achieve Goals: Good Pt will go Supine/Side to Sit: with supervision PT Goal: Supine/Side to Sit - Progress: Goal set today Pt will go Sit to Supine/Side: with supervision PT Goal: Sit to Supine/Side - Progress: Progressing toward goal Pt will go Stand to Sit: with supervision PT  Goal: Stand to Sit - Progress: Progressing toward goal Pt will Ambulate: 51 - 150 feet;with supervision;with rolling walker PT Goal: Ambulate - Progress: Progressing toward goal Pt will Go Up / Down Stairs: 1-2 stairs;with min assist;with rolling walker PT Goal: Up/Down Stairs - Progress: Goal set today Pt will Perform Home Exercise Program: with supervision, verbal cues required/provided PT Goal: Perform Home Exercise Program - Progress: Progressing toward goal  Visit Information  Last PT Received On: 12/12/11 Assistance Needed: +1    Subjective Data  Subjective: i want to do whatever i need to do to go home tomorrow   Cognition  Orientation Level: Appears intact for tasks assessed    Balance     End of Session PT - End of Session Activity Tolerance: Patient tolerated treatment well Patient left: in bed;with call bell/phone within reach;with family/visitor present Nurse Communication: Mobility status    Rada Hay 12/12/2011, 3:21 PM [phone]

## 2011-12-12 NOTE — Evaluation (Signed)
Physical Therapy Evaluation Patient Details Name: Maurice Hall MRN: [id number] DOB: 05/21/1931 Today's Date: 12/12/2011 Time: 4098-1191 PT Time Calculation (min): 58 min  PT Assessment / Plan / Recommendation Clinical Impression  pt is s/p RTKA  who tolerated ambulation well w/ no dizziness. pt would like to DC to hiome tomorrow, pt will need RW/     PT Assessment  Patient needs continued PT services    Follow Up Recommendations  Home health PT;Supervision/Assistance - 24 hour    Equipment Recommendations  Rolling walker with 5" wheels;3 in 1 bedside comode    Frequency 7X/week    Precautions / Restrictions Precautions Precautions: Knee Required Braces or Orthoses: Knee Immobilizer - Right Knee Immobilizer - Right: Discontinue once straight leg raise with < 10 degree lag Restrictions Weight Bearing Restrictions: Yes RLE Weight Bearing: Weight bearing as tolerated   Pertinent Vitals/Pain 1/10 pain      Mobility  Bed Mobility Supine to Sit: 4: Min assist Details for Bed Mobility Assistance: pt able to slide RLE to edge with no assistance Transfers Transfers: Sit to Stand;Stand to Sit Sit to Stand: 4: Min assist;From bed;With upper extremity assist;From elevated surface Stand to Sit: 4: Min assist;To chair/3-in-1;With upper extremity assist Details for Transfer Assistance: vc for techniques for sit to/stand Ambulation/Gait Ambulation/Gait Assistance: 4: Min assist Ambulation Distance (Feet): 150 Feet Assistive device: Rolling walker Ambulation/Gait Assistance Details: vc on sequence/ position inside RW, stride length Gait Pattern: Step-through pattern Gait velocity: fairly good clip General Gait Details: pt toerated well    Exercises Total Joint Exercises Ankle Circles/Pumps: AROM;Both;10 reps Quad Sets: AROM;Right;10 reps;Supine Heel Slides: AAROM;Right;10 reps;Supine Straight Leg Raises: AAROM;Right;10 reps;Supine   PT Goals Acute Rehab PT Goals PT Goal  Formulation: With patient Time For Goal Achievement: 12/19/11 Potential to Achieve Goals: Good Pt will go Supine/Side to Sit: with supervision PT Goal: Supine/Side to Sit - Progress: Goal set today Pt will go Sit to Supine/Side: with supervision PT Goal: Sit to Supine/Side - Progress: Goal set today Pt will go Stand to Sit: with supervision PT Goal: Stand to Sit - Progress: Goal set today Pt will Ambulate: 51 - 150 feet;with supervision;with rolling walker PT Goal: Ambulate - Progress: Goal set today Pt will Go Up / Down Stairs: 1-2 stairs;with min assist;with rolling walker PT Goal: Up/Down Stairs - Progress: Goal set today Pt will Perform Home Exercise Program: with supervision, verbal cues required/provided PT Goal: Perform Home Exercise Program - Progress: Goal set today  Visit Information  Last PT Received On: 12/12/11 Assistance Needed: +1    Subjective Data  Subjective: it does not hurt Patient Stated Goal: to go home tomorrow   Prior Functioning  Home Living Lives With: Spouse Available Help at Discharge: Family Type of Home: House Home Access: Stairs to enter Secretary/administrator of Steps: 1 Entrance Stairs-Rails: None Home Layout: One level Home Adaptive Equipment: None Prior Function Level of Independence: Independent Able to Take Stairs?: Yes Driving: Yes Communication Communication: No difficulties    Cognition  Overall Cognitive Status: Appears within functional limits for tasks assessed/performed Arousal/Alertness: Awake/alert Orientation Level: Appears intact for tasks assessed Behavior During Session: [facility] for tasks performed    Extremity/Trunk Assessment Right Upper Extremity Assessment RUE ROM/Strength/Tone: Within functional levels Left Upper Extremity Assessment LUE ROM/Strength/Tone: Within functional levels Right Lower Extremity Assessment RLE ROM/Strength/Tone: Deficits RLE ROM/Strength/Tone Deficits: tolerated 40 degrees knee flexion, +  SLR RLE Sensation: WFL - Light Touch Left Lower Extremity Assessment LLE ROM/Strength/Tone: Within functional levels  LLE Sensation: WFL - Light Touch Trunk Assessment Trunk Assessment: Normal   Balance    End of Session PT - End of Session Equipment Utilized During Treatment: Right knee immobilizer Activity Tolerance: Patient tolerated treatment well Patient left: in chair;with call bell/phone within reach Nurse Communication: Mobility status (blood on bed but not actively bleeding. reinforced dressing) CPM Right Knee CPM Right Knee: Off   Rada Hay 12/12/2011, 11:38 AM  [phone]

## 2011-12-12 NOTE — Plan of Care (Signed)
Problem: Consults Goal: Diagnosis- Total Joint Replacement Outcome: Completed/Met Date Met:  12/12/11 Primary Total Knee Right

## 2011-12-12 NOTE — Progress Notes (Signed)
OT Screen Order received, chart reviewed. Pt & wife stated pt will have necessary level of A once at home and have already ordered a 3:1. Discussed with pt how to safely step into the shower and the different ways a 3:1 can be used. Pt presents with no further OT needs at this time. Will sign off.  Garrel Ridgel, OTR/L  Pager [phone] 12/12/2011

## 2011-12-12 NOTE — Progress Notes (Signed)
Subjective: 1 Day Post-Op Procedure(s) (LRB): TOTAL KNEE ARTHROPLASTY (Right) STEROID INJECTION (Left) ARTHROCENTESIS ASPIRATION LARGE JOINT (Left) Patient reports pain as minimal.  Doing great this morning. The left knee is also doing very good this morning. Patient seen in rounds with Dr. Lequita Halt. Patient is well, and has had no acute complaints or problems We will start therapy today.  Plan is to go Home after hospital stay.  Objective: Vital signs in last 24 hours: Temp:  [97 F (36.1 C)-98 F (36.7 C)] 97.3 F (36.3 C) (05/07 0652) Pulse Rate:  [52-98] 62  (05/07 0652) Resp:  [7-26] 16  (05/07 0713) BP: (130-173)/(61-77) 138/70 mmHg (05/07 0652) SpO2:  [94 %-100 %] 98 % (05/07 0713) Weight:  [102.059 kg (225 lb)] 102.059 kg (225 lb) (05/06 1522)  Intake/Output from previous day:  Intake/Output Summary (Last 24 hours) at 12/12/11 0817 Last data filed at 12/12/11 2841  Gross per 24 hour  Intake 4894.16 ml  Output   3055 ml  Net 1839.16 ml    Intake/Output this shift: 2050 since MN  Labs:  Basename 12/12/11 0400  HGB 13.9    Basename 12/12/11 0400  WBC 11.0*  RBC 4.26  HCT 40.0  PLT 160    Basename 12/12/11 0400  NA 136  K 4.1  CL 101  CO2 26  BUN 17  CREATININE 0.97  GLUCOSE 144*  CALCIUM 9.0   No results found for this basename: LABPT:2,INR:2 in the last 72 hours  EXAM General - Patient is Alert, Appropriate and Oriented Extremity - Neurovascular intact Sensation intact distally Dressing - dressing C/D/I Motor Function - intact, moving foot and toes well on exam.  Hemovac pulled without difficulty.  Past Medical History  Diagnosis Date  . HTN (hypertension)   . Symptomatic bradycardia   . Dyslipidemia   . Pacemaker   . Dysrhythmia 2003    sinoatrial node dysfunction  . GERD (gastroesophageal reflux disease)   . Arthritis     Assessment/Plan: 1 Day Post-Op Procedure(s) (LRB): TOTAL KNEE ARTHROPLASTY (Right) STEROID INJECTION  (Left) ARTHROCENTESIS ASPIRATION LARGE JOINT (Left) Principal Problem:  *OA (osteoarthritis) of knee   Advance diet Up with therapy Continue foley due to strict I&O and urinary output monitoring Plan for discharge tomorrow Discharge home with home health  DVT Prophylaxis - Xarelto Weight-Bearing as tolerated to left leg Keep foley until tomorrow. No vaccines. D/C PCA Morphine, Change to IV push D/C O2 and Pulse OX and try on Room Air  Kali Ambler, Marlowe Sax 12/12/2011, 8:17 AM

## 2011-12-13 LAB — CBC
HCT: 38.3 % — ABNORMAL LOW (ref 39.0–52.0)
Hemoglobin: 13.2 g/dL (ref 13.0–17.0)
MCV: 95 fL (ref 78.0–100.0)
Platelets: 177 10*3/uL (ref 150–400)
RBC: 4.03 MIL/uL — ABNORMAL LOW (ref 4.22–5.81)
WBC: 11.7 10*3/uL — ABNORMAL HIGH (ref 4.0–10.5)

## 2011-12-13 LAB — BASIC METABOLIC PANEL
CO2: 27 mEq/L (ref 19–32)
Chloride: 101 mEq/L (ref 96–112)
Creatinine, Ser: 1.06 mg/dL (ref 0.50–1.35)
Glucose, Bld: 121 mg/dL — ABNORMAL HIGH (ref 70–99)

## 2011-12-13 MED ORDER — OXYCODONE HCL 5 MG PO TABS: 5.0000 mg | ORAL_TABLET | ORAL | Status: AC | PRN

## 2011-12-13 MED ORDER — METHOCARBAMOL 500 MG PO TABS: 500.0000 mg | ORAL_TABLET | Freq: Four times a day (QID) | ORAL | Status: AC | PRN

## 2011-12-13 MED ORDER — RIVAROXABAN 10 MG PO TABS: 10.0000 mg | ORAL_TABLET | Freq: Every day | ORAL | Status: DC

## 2011-12-13 NOTE — Care Management Note (Signed)
    Page 1 of 2   12/13/2011     2:51:18 PM   CARE MANAGEMENT NOTE 12/13/2011  Patient:  Maurice Hall, Maurice Hall   Account Number:  [id number]  Date Initiated:  12/12/2011  Documentation initiated by:  Colleen Can  Subjective/Objective Assessment:   dx rt knee replacemnt     Action/Plan:   Cm spoke with patient and spouse. Plans are for patient to return to home in [facility] where spouse will be caregiver. Plans are to use [facility] for [facility] services-Dme already in rm-rw, 3n1   Anticipated DC Date:  12/13/2011   Anticipated DC Plan:  HOME W HOME HEALTH SERVICES  In-house referral  Clinical Social Worker      DC Associate Professor  CM consult      [facility] Choice  HOME HEALTH  DURABLE MEDICAL EQUIPMENT   Choice offered to / List presented to:  C-1 Patient   DME arranged  NA      DME agency  NA     HH arranged  HH-2 PT      [facility] agency  [facility]   Status of service:  Completed, signed off Medicare Important Message given?  NA - LOS <3 / Initial given by admissions (If response is "NO", the following Medicare IM given date fields will be blank) Date Medicare IM given:   Date Additional Medicare IM given:    Discharge Disposition:  HOME W HOME HEALTH SERVICES  Per UR Regulation:    If discussed at Long Length of Stay Meetings, dates discussed:    Comments:  12/13/2011 Raynelle Bring BSN CCM [phone] Pt discharged to home with [facility] services in place. Services will start tomorrow 12/14/2011

## 2011-12-13 NOTE — Progress Notes (Signed)
Subjective: 2 Days Post-Op Procedure(s) (LRB): TOTAL KNEE ARTHROPLASTY (Right) STEROID INJECTION (Left) ARTHROCENTESIS ASPIRATION LARGE JOINT (Left) Patient reports pain as mild.   Patient seen in rounds with Dr. Lequita Halt. Patient is well, and has had no acute complaints or problems Patient is ready to go home today.  Will setup discharge for later today after second session of therapy.  Objective: Vital signs in last 24 hours: Temp:  [97.3 F (36.3 C)-97.9 F (36.6 C)] 97.3 F (36.3 C) (05/08 0518) Pulse Rate:  [55-69] 55  (05/08 0518) Resp:  [14-16] 14  (05/08 0518) BP: (143-154)/(69-80) 154/78 mmHg (05/08 0518) SpO2:  [94 %-98 %] 95 % (05/08 0518)  Intake/Output from previous day:  Intake/Output Summary (Last 24 hours) at 12/13/11 1028 Last data filed at 12/13/11 0600  Gross per 24 hour  Intake    816 ml  Output   4300 ml  Net  -3484 ml    Intake/Output this shift:    Labs:  Basename 12/13/11 0411 12/12/11 0400  HGB 13.2 13.9    Basename 12/13/11 0411 12/12/11 0400  WBC 11.7* 11.0*  RBC 4.03* 4.26  HCT 38.3* 40.0  PLT 177 160    Basename 12/13/11 0411 12/12/11 0400  NA 137 136  K 3.7 4.1  CL 101 101  CO2 27 26  BUN 20 17  CREATININE 1.06 0.97  GLUCOSE 121* 144*  CALCIUM 8.8 9.0   No results found for this basename: LABPT:2,INR:2 in the last 72 hours  EXAM: General - Patient is Alert, Appropriate and Oriented Extremity - Neurovascular intact Sensation intact distally Incision - clean, dry, no drainage, healing Motor Function - intact, moving foot and toes well on exam.   Assessment/Plan: 2 Days Post-Op Procedure(s) (LRB): TOTAL KNEE ARTHROPLASTY (Right) STEROID INJECTION (Left) ARTHROCENTESIS ASPIRATION LARGE JOINT (Left) Procedure(s) (LRB): TOTAL KNEE ARTHROPLASTY (Right) STEROID INJECTION (Left) ARTHROCENTESIS ASPIRATION LARGE JOINT (Left) Past Medical History  Diagnosis Date  . HTN (hypertension)   . Symptomatic bradycardia   .  Dyslipidemia   . Pacemaker   . Dysrhythmia 2003    sinoatrial node dysfunction  . GERD (gastroesophageal reflux disease)   . Arthritis    Principal Problem:  *OA (osteoarthritis) of knee   Up with therapy Discharge home with home health Diet - Cardiac diet Follow up - in 2 weeks Activity - WBAT Disposition - Home Condition Upon Discharge - Good D/C Meds - See DC Summary DVT Prophylaxis - Xarelto  [employer] 12/13/2011, 10:28 AM

## 2011-12-13 NOTE — Progress Notes (Signed)
Physical Therapy Treatment Patient Details Name: Maurice Hall MRN: [id number] DOB: 10-16-1930 Today's Date: 12/13/2011 Time: 4098-1191 PT Time Calculation (min): 18 min  PT Assessment / Plan / Recommendation Comments on Treatment Session  pt is ready fro DC.    Follow Up Recommendations  Home health PT    Equipment Recommendations       Frequency 7X/week   Plan Discharge plan remains appropriate    Precautions / Restrictions Restrictions Weight Bearing Restrictions: No RLE Weight Bearing: Weight bearing as tolerated   Pertinent Vitals/Pain 1    Mobility   Transfers Transfers: Sit to Stand;Stand to Sit Sit to Stand: From chair/3-in-1;5: Supervision;With upper extremity assist Stand to Sit: To chair/3-in-1;5: Supervision Details for Transfer Assistance: pt has no difficulty w/out KI Ambulation/Gait Ambulation/Gait Assistance: 5: Supervision Ambulation Distance (Feet): 80 Feet Assistive device: Rolling walker Ambulation/Gait Assistance Details: no limp Gait Pattern: Step-through pattern Stairs: Yes Stairs Assistance: 4: Min guard Stair Management Technique: No rails;Forwards;With walker Number of Stairs: 1     Exercises    PT Goals Acute Rehab PT Goals PT Goal Formulation: With patient Time For Goal Achievement: 12/19/11 Potential to Achieve Goals: Good Pt will go Supine/Side to Sit: with supervision PT Goal: Supine/Side to Sit - Progress: Met Pt will go Sit to Supine/Side: with supervision Pt will go Stand to Sit: with supervision PT Goal: Stand to Sit - Progress: Progressing toward goal Pt will Ambulate: 51 - 150 feet;with supervision;with rolling walker PT Goal: Ambulate - Progress: Progressing toward goal Pt will Go Up / Down Stairs: 1-2 stairs PT Goal: Up/Down Stairs - Progress: Met Pt will Perform Home Exercise Program: with supervision, verbal cues required/provided PT Goal: Perform Home Exercise Program - Progress: Progressing toward goal  Visit  Information  Last PT Received On: 12/13/11 Assistance Needed: +1    Subjective Data  Subjective: i am ready to go   Cognition  Overall Cognitive Status: Appears within functional limits for tasks assessed/performed    Balance     End of Session PT - End of Session Activity Tolerance: Patient tolerated treatment well Patient left: in chair Nurse Communication: Mobility status    Rada Hay 12/13/2011, 5:57 PM

## 2011-12-13 NOTE — Progress Notes (Signed)
Pt stable, scripts, equipment, and d/c instructions given with no questions/concerns voiced by patient/wife.  Pt transported to private vehicle via wheelchair with NT.

## 2011-12-13 NOTE — Progress Notes (Signed)
Physical Therapy Treatment Patient Details Name: Maurice Hall MRN: [id number] DOB: 06/15/1931 Today's Date: 12/13/2011 Time: 1020-1059 PT Time Calculation (min): 39 min  PT Assessment / Plan / Recommendation Comments on Treatment Session  pt is doing very well. needs to practice 1 step, then DC    Follow Up Recommendations  Home health PT    Equipment Recommendations       Frequency 7X/week   Plan Discharge plan remains appropriate    Precautions / Restrictions Restrictions Weight Bearing Restrictions: No RLE Weight Bearing: Weight bearing as tolerated   Pertinent Vitals/Pain 0    Mobility  Bed Mobility Supine to Sit: 5: Supervision;HOB flat Sit to Supine: 5: Supervision;HOB flat Details for Bed Mobility Assistance: pt requires no assistance Transfers Transfers: Sit to Stand;Stand to Sit Sit to Stand: From bed;4: Min guard;With upper extremity assist Stand to Sit: To chair/3-in-1;4: Min assist;With upper extremity assist Details for Transfer Assistance: pt has no difficulty w/out KI Ambulation/Gait Ambulation/Gait Assistance: 4: Min guard Ambulation Distance (Feet): 400 Feet Assistive device: Rolling walker Ambulation/Gait Assistance Details: no limp Gait Pattern: Step-through pattern    Exercises Total Joint Exercises Ankle Circles/Pumps: AROM;Both;10 reps Quad Sets: AROM;Right;10 reps;Supine Short Arc Quad: AROM;Right Heel Slides: AAROM;Right;10 reps;Supine Straight Leg Raises: AAROM;Right;10 reps;Supine Long Arc Quad: AAROM;Right;10 reps;Seated   PT Goals Acute Rehab PT Goals PT Goal Formulation: With patient Time For Goal Achievement: 12/19/11 Potential to Achieve Goals: Good Pt will go Supine/Side to Sit: with supervision PT Goal: Supine/Side to Sit - Progress: Met Pt will go Sit to Supine/Side: with supervision Pt will go Stand to Sit: with supervision PT Goal: Stand to Sit - Progress: Progressing toward goal Pt will Ambulate: 51 - 150 feet;with  supervision;with rolling walker PT Goal: Ambulate - Progress: Progressing toward goal Pt will Go Up / Down Stairs: 1-2 stairs;with min assist;with rolling walker Pt will Perform Home Exercise Program: with supervision, verbal cues required/provided PT Goal: Perform Home Exercise Program - Progress: Progressing toward goal  Visit Information  Last PT Received On: 12/13/11 Assistance Needed: +1    Subjective Data  Subjective: i want to go home   Cognition  Overall Cognitive Status: Appears within functional limits for tasks assessed/performed    Balance     End of Session PT - End of Session Activity Tolerance: Patient tolerated treatment well Patient left: in chair;with call bell/phone within reach Nurse Communication: Mobility status    Rada Hay 12/13/2011, 5:54 PM

## 2011-12-13 NOTE — Discharge Instructions (Signed)
 Dr. Frank Aluisio Total Joint Specialist [facility] [address] [place], [place] [address] [phone]  TOTAL KNEE REPLACEMENT POSTOPERATIVE DIRECTIONS    Knee Rehabilitation, Guidelines Following Surgery  Results after knee surgery are often greatly improved when you follow the exercise, range of motion and muscle strengthening exercises prescribed by your doctor. Safety measures are also important to protect the knee from further injury. Any time any of these exercises cause you to have increased pain or swelling in your knee joint, decrease the amount until you are comfortable again and slowly increase them. If you have problems or questions, call your caregiver or physical therapist for advice.   HOME CARE INSTRUCTIONS  Remove items at home which could result in a fall. This includes throw rugs or furniture in walking pathways.  Continue medications as instructed at time of discharge. You may have some home medications which will be placed on hold until you complete the course of blood thinner medication.  You may start showering once you are discharged home but do not submerge the incision under water.  Walk with walker as instructed.  You may put full weight on your legs and walk as much as is comfortable.  You may resume a sexual relationship in one month or when given the OK by your doctor.  You may return to work once you are cleared by your doctor.  Do not drive a car for 6 weeks unless released by you surgeon.  Wear the elastic stockings for three weeks following surgery during the day but you may remove then at night. Make sure you keep all of your appointments after your operation with all of your doctors and caregivers. You should call the office at the above phone number and make an appointment for approximately two weeks after the date of your surgery. Change the dressing daily and reapply a dry dressing each time. Please pick up a stool  softener and laxative for home use as long as you are requiring pain medications. Continue to use ice on the knee for pain and swelling from surgery. It is important for you to complete the blood thinner medication as prescribed by your doctor.  RANGE OF MOTION AND STRENGTHENING EXERCISES  Rehabilitation of the knee is important following a knee injury or an operation. After just a few days of immobilization, the muscles of the thigh which control the knee become weakened and shrink (atrophy). Knee exercises are designed to build up the tone and strength of the thigh muscles and to improve knee motion. Often times heat used for twenty to thirty minutes before working out will loosen up your tissues and help with improving the range of motion but do not use heat for the first two weeks following surgery. These exercises can be done on a training (exercise) mat, on the floor, on a table or on a bed. Use what ever works the best and is most comfortable for you Knee exercises include:  Leg Lifts - While your knee is still immobilized in a splint or cast, you can do straight leg raises. Lift the leg to 60 degrees, hold for 3 sec, and slowly lower the leg. Repeat 10-20 times 2-3 times daily. Perform this exercise against resistance later as your knee gets better.  Quad and Hamstring Sets - Tighten up the muscle on the front of the thigh (Quad) and hold for 5-10 sec. Repeat this 10-20 times hourly. Hamstring sets are done by pushing the foot backward against   an object and holding for 5-10 sec. Repeat as with quad sets.  A rehabilitation program following serious knee injuries can speed recovery and prevent re-injury in the future due to weakened muscles. Contact your doctor or a physical therapist for more information on knee rehabilitation.   SKILLED REHAB INSTRUCTIONS: If the patient is transferred to a skilled rehab facility following release from the hospital, a list of the current medications will be sent  to the facility for the patient to continue.  When discharged from the skilled rehab facility, please have the facility set up the patient's Home Health Physical Therapy prior to being released. Also, the skilled facility will be responsible for providing the patient with their medications at time of release from the facility to include their pain medication, the muscle relaxants, and their blood thinner medication. If the patient is still at the rehab facility at time of the two week follow up appointment, the skilled rehab facility will also need to assist the patient in arranging follow up appointment in our office and any transportation needs.  MAKE SURE YOU:  Understand these instructions.  Will watch your condition.  Will get help right away if you are not doing well or get worse.  Document Released: 07/24/2005 Document Revised: 07/13/2011 Document Reviewed: 01/11/2007  [employer] Patient Information 2012 [employer].           

## 2011-12-19 ENCOUNTER — Encounter: Payer: Self-pay | Admitting: Internal Medicine

## 2011-12-19 DIAGNOSIS — I498 Other specified cardiac arrhythmias: Secondary | ICD-10-CM

## 2011-12-21 NOTE — Discharge Summary (Signed)
Physician Discharge Summary   Patient ID: Maurice Hall MRN: [id number] DOB/AGE: February 19, 1931 78 y.o.  Admit date: 12/11/2011 Discharge date: 12/13/2011  Primary Diagnosis: Osteoarthritis Right Knee greater than Left Knee   Admission Diagnoses:  Past Medical History  Diagnosis Date  . HTN (hypertension)   . Symptomatic bradycardia   . Dyslipidemia   . Pacemaker   . Dysrhythmia 2003    sinoatrial node dysfunction  . GERD (gastroesophageal reflux disease)   . Arthritis    Discharge Diagnoses:   Principal Problem:  *OA (osteoarthritis) of knee  Procedure:  Procedure(s) (LRB): TOTAL KNEE ARTHROPLASTY (Right) STEROID INJECTION (Left) ARTHROCENTESIS ASPIRATION LARGE JOINT (Left)   Consults: None  HPI: Maurice Hall is a 78 y.o. year old male with end stage OA of his right knee with progressively worsening pain and dysfunction. He has constant pain, with activity and at rest and significant functional deficits with difficulties even with ADLs. He has had extensive non-op management including analgesics, injections of cortisone, and home exercise program, but remains in significant pain with significant dysfunction. Radiographs show bone on bone arthritis medial and patellofemoral compartments with varus deformity and tibial subluxation. He presents now for right Total Knee Arthroplasty. He also has significant left knee arthritis with an effusion and will undergo an aspiration and cortisone injection of the left knee.   Laboratory Data: Hospital Outpatient Visit on 12/04/2011  Component Date Value Range Status  . aPTT (seconds) 12/04/2011 32  24-37 Final  . WBC (K/uL) 12/04/2011 6.1  4.0-10.5 Final  . RBC (MIL/uL) 12/04/2011 5.05  4.22-5.81 Final  . Hemoglobin (g/dL) 40/98/1191 47.8  29.5-62.1 Final  . HCT (%) 12/04/2011 47.7  39.0-52.0 Final  . MCV (fL) 12/04/2011 94.5  78.0-100.0 Final  . MCH (pg) 12/04/2011 32.7  26.0-34.0 Final  . MCHC (g/dL) 30/86/5784 69.6  29.5-28.4 Final  .  RDW (%) 12/04/2011 12.4  11.5-15.5 Final  . Platelets (K/uL) 12/04/2011 151  150-400 Final  . Sodium (mEq/L) 12/04/2011 140  135-145 Final  . Potassium (mEq/L) 12/04/2011 3.8  3.5-5.1 Final  . Chloride (mEq/L) 12/04/2011 101  96-112 Final  . CO2 (mEq/L) 12/04/2011 28  19-32 Final  . Glucose, Bld (mg/dL) 13/24/4010 272* 53-66 Final  . BUN (mg/dL) 44/10/4740 16  5-95 Final  . Creatinine, Ser (mg/dL) 63/87/5643 3.29  5.18-8.41 Final  . Calcium (mg/dL) 66/01/3015 01.0  9.3-23.5 Final  . Total Protein (g/dL) 57/32/2025 7.4  4.2-7.0 Final  . Albumin (g/dL) 62/37/6283 4.2  1.5-1.7 Final  . AST (U/L) 12/04/2011 27  0-37 Final  . ALT (U/L) 12/04/2011 21  0-53 Final  . Alkaline Phosphatase (U/L) 12/04/2011 50  39-117 Final  . Total Bilirubin (mg/dL) 61/60/7371 0.7  0.6-2.6 Final  . GFR calc non Af Amer (mL/min) 12/04/2011 68* >90 Final  . GFR calc Af Amer (mL/min) 12/04/2011 79* >90 Final   Comment:                                 The eGFR has been calculated                          using the CKD EPI equation.                          This calculation has not been  validated in all clinical                          situations.                          eGFR's persistently                          <90 mL/min signify                          possible Chronic Kidney Disease.  Marland Kitchen Prothrombin Time (seconds) 12/04/2011 13.0  11.6-15.2 Final  . INR  12/04/2011 0.96  0.00-1.49 Final  . Color, Urine  12/04/2011 YELLOW  YELLOW Final  . APPearance  12/04/2011 CLEAR  CLEAR Final  . Specific Gravity, Urine  12/04/2011 1.009  1.005-1.030 Final  . pH  12/04/2011 6.5  5.0-8.0 Final  . Glucose, UA (mg/dL) 16/05/9603 NEGATIVE  NEGATIVE Final  . Hgb urine dipstick  12/04/2011 NEGATIVE  NEGATIVE Final  . Bilirubin Urine  12/04/2011 NEGATIVE  NEGATIVE Final  . Ketones, ur (mg/dL) 54/04/8118 NEGATIVE  NEGATIVE Final  . Protein, ur (mg/dL) 14/78/2956 NEGATIVE  NEGATIVE Final  .  Urobilinogen, UA (mg/dL) 21/30/8657 0.2  8.4-6.9 Final  . Nitrite  12/04/2011 NEGATIVE  NEGATIVE Final  . Leukocytes, UA  12/04/2011 NEGATIVE  NEGATIVE Final   MICROSCOPIC NOT DONE ON URINES WITH NEGATIVE PROTEIN, BLOOD, LEUKOCYTES, NITRITE, OR GLUCOSE <1000 mg/dL.  Marland Kitchen MRSA, PCR  12/04/2011 NEGATIVE  NEGATIVE Final  . Staphylococcus aureus  12/04/2011 POSITIVE* NEGATIVE Final   Comment:                                 The Xpert SA Assay (FDA                          approved for NASAL specimens                          only), is one component of                          a comprehensive surveillance                          program.  It is not intended                          to diagnose infection nor to                          guide or monitor treatment.   No results found for this basename: HGB:5 in the last 72 hours No results found for this basename: WBC:2,RBC:2,HCT:2,PLT:2 in the last 72 hours No results found for this basename: NA:2,K:2,CL:2,CO2:2,BUN:2,CREATININE:2,GLUCOSE:2,CALCIUM:2 in the last 72 hours No results found for this basename: LABPT:2,INR:2 in the last 72 hours  X-Rays: Chest 2 View  12/04/2011  *RADIOLOGY REPORT*  Clinical Data: Preop knee surgery  CHEST - 2 VIEW  Comparison: 02/04/2007  Findings: Lungs are clear. No pleural effusion or pneumothorax.  Cardiomediastinal silhouette is within normal limits.  Left subclavian  pacer.  Degenerative changes of the visualized thoracolumbar spine.  IMPRESSION: No evidence of acute cardiopulmonary disease.  Original Report Authenticated By: Charline Bills, M.D.    EKG: Orders placed during the hospital encounter of 12/11/11  . EKG     Hospital Course: Patient was admitted to [facility] and taken to the OR and underwent the above state procedure without complications.  Patient tolerated the procedure well and was later transferred to the recovery room and then to the orthopaedic floor for postoperative care.  They  were given PO and IV analgesics for pain control following their surgery.  They were given 24 hours of postoperative antibiotics and started on DVT prophylaxis in the form of Xarelto.   PT and OT were ordered for total joint protocol.  Discharge planning consulted to help with postop disposition and equipment needs.  Patient had a good night on the evening of surgery and started to get up OOB with therapy on day one.  PCA Morphine was discontinued and they were weaned over to PO meds.  Hemovac drain was pulled without difficulty.  Continued to work with therapy into day two.  Dressing was changed on day two and the incision was healing well.    Patient was seen in rounds and was ready to go home later that afternoon on day two.  Discharge Medications: Prior to Admission medications   Medication Sig Start Date End Date Taking? Authorizing Provider  meloxicam (MOBIC) 7.5 MG tablet Take 7.5 mg by mouth daily with breakfast.    Yes Historical Provider, MD  simvastatin (ZOCOR) 20 MG tablet Take 20 mg by mouth at bedtime.    Yes Historical Provider, MD  valsartan-hydrochlorothiazide (DIOVAN-HCT) 160-25 MG per tablet Take 1 tablet by mouth daily with breakfast.    Yes Historical Provider, MD  methocarbamol (ROBAXIN) 500 MG tablet Take 1 tablet (500 mg total) by mouth every 6 (six) hours as needed. 12/13/11 12/23/11  Zuhair Lariccia, PA  oxyCODONE (OXY IR/ROXICODONE) 5 MG immediate release tablet Take 1-2 tablets (5-10 mg total) by mouth every 3 (three) hours as needed. 12/13/11 12/23/11  Delaynee Alred Julien Girt, PA  rivaroxaban (XARELTO) 10 MG TABS tablet Take 1 tablet (10 mg total) by mouth daily with breakfast. 12/13/11   Kendra Woolford Julien Girt, PA    Diet: Cardiac diet Activity:WBAT Follow-up:in 2 weeks Disposition - Home Discharged Condition: good   Discharge Orders    Future Orders Please Complete By Expires   Diet - low sodium heart healthy      Call MD / Call 911      Comments:   If you experience  chest pain or shortness of breath, CALL 911 and be transported to the hospital emergency room.  If you develope a fever above 101 F, pus (white drainage) or increased drainage or redness at the wound, or calf pain, call your surgeon's office.   Discharge instructions      Comments:   Pick up stool softner and laxative for home. Do not submerge incision under water. May shower. Continue to use ice for pain and swelling from surgery.  Take Xarelto for two and a half more weeks, then discontinue Xarelto. Patient may resume his Aspirin 81 mg after completing the Xarelto.    Constipation Prevention      Comments:   Drink plenty of fluids.  Prune juice may be helpful.  You may use a stool softener, such as Colace (over the counter) 100 mg twice a day.  Use MiraLax (over the  counter) for constipation as needed.   Increase activity slowly as tolerated      Weight Bearing as taught in Physical Therapy      Comments:   Use a walker or crutches as instructed.   Patient may shower      Comments:   You may shower without a dressing once there is no drainage.  Do not wash over the wound.  If drainage remains, do not shower until drainage stops.   Driving restrictions      Comments:   No driving until released by the physician.   Lifting restrictions      Comments:   No lifting until released by the physician.   TED hose      Comments:   Use stockings (TED hose) for 3 weeks on both leg(s).  You may remove them at night for sleeping.   Change dressing      Comments:   Change dressing daily with sterile 4 x 4 inch gauze dressing and apply TED hose. Do not submerge the incision under water.   Do not put a pillow under the knee. Place it under the heel.      Do not sit on low chairs, stoools or toilet seats, as it may be difficult to get up from low surfaces        Medication List  As of 12/21/2011 12:38 PM   STOP taking these medications         aspirin 81 MG tablet      fish oil-omega-3 fatty  acids 1000 MG capsule      Osteo Bi-Flex Joint Shield Tabs         TAKE these medications         meloxicam 7.5 MG tablet   Commonly known as: MOBIC   Take 7.5 mg by mouth daily with breakfast.      methocarbamol 500 MG tablet   Commonly known as: ROBAXIN   Take 1 tablet (500 mg total) by mouth every 6 (six) hours as needed.      oxyCODONE 5 MG immediate release tablet   Commonly known as: Oxy IR/ROXICODONE   Take 1-2 tablets (5-10 mg total) by mouth every 3 (three) hours as needed.      rivaroxaban 10 MG Tabs tablet   Commonly known as: XARELTO   Take 1 tablet (10 mg total) by mouth daily with breakfast.      simvastatin 20 MG tablet   Commonly known as: ZOCOR   Take 20 mg by mouth at bedtime.      valsartan-hydrochlorothiazide 160-25 MG per tablet   Commonly known as: DIOVAN-HCT   Take 1 tablet by mouth daily with breakfast.           Follow-up Information    Follow up with Loanne Drilling, MD. Schedule an appointment as soon as possible for a visit in 2 weeks.   Contact information:   [facility] [address] [place] [address] [phone]          Signed: Patrica Duel 12/21/2011, 12:38 PM

## 2012-03-19 DIAGNOSIS — I459 Conduction disorder, unspecified: Secondary | ICD-10-CM

## 2012-05-29 ENCOUNTER — Encounter: Payer: Self-pay | Admitting: *Deleted

## 2012-06-04 ENCOUNTER — Encounter: Payer: Self-pay | Admitting: Internal Medicine

## 2012-06-04 ENCOUNTER — Ambulatory Visit (INDEPENDENT_AMBULATORY_CARE_PROVIDER_SITE_OTHER): Payer: Medicare Other | Admitting: Internal Medicine

## 2012-06-04 VITALS — BP 144/75 | HR 56 | Ht 74.0 in | Wt 227.0 lb

## 2012-06-04 DIAGNOSIS — Z950 Presence of cardiac pacemaker: Secondary | ICD-10-CM

## 2012-06-04 DIAGNOSIS — I495 Sick sinus syndrome: Secondary | ICD-10-CM

## 2012-06-04 DIAGNOSIS — I10 Essential (primary) hypertension: Secondary | ICD-10-CM

## 2012-06-04 LAB — PACEMAKER DEVICE OBSERVATION
AL IMPEDENCE PM: 488 Ohm
AL THRESHOLD: 0.5 V
ATRIAL PACING PM: 53
RV LEAD THRESHOLD: 0.5 V

## 2012-06-04 NOTE — Assessment & Plan Note (Signed)
His Medtronic dual-chamber pacemaker is working normally. He has approximately 2 years of battery longevity. I will see him back in one year, he will followup before then as needed.

## 2012-06-04 NOTE — Progress Notes (Signed)
HPI Maurice Hall returns today for followup. He is a 78 yo man with a h/o symptomatic bradycardia, HTN, and arthritis, s/p PPM. IN the interim he has done well. 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 He denies chest pain, shortness of breath, or peripheral edema. No syncope. No Known Allergies   Current Outpatient Prescriptions  Medication Sig Dispense Refill  . aspirin 81 MG tablet Take 81 mg by mouth daily.      Marland Kitchen losartan-hydrochlorothiazide (HYZAAR) 100-25 MG per tablet Take 1 tablet by mouth daily.      . meloxicam (MOBIC) 7.5 MG tablet Take 7.5 mg by mouth daily with breakfast.       . rivaroxaban (XARELTO) 10 MG TABS tablet Take 1 tablet (10 mg total) by mouth daily with breakfast.  19 tablet  0  . simvastatin (ZOCOR) 20 MG tablet Take 20 mg by mouth at bedtime.       . valsartan-hydrochlorothiazide (DIOVAN-HCT) 160-25 MG per tablet Take 1 tablet by mouth daily with breakfast.          Past Medical History  Diagnosis Date  . HTN (hypertension)   . Symptomatic bradycardia   . Dyslipidemia   . Pacemaker   . Dysrhythmia 2003    sinoatrial node dysfunction  . GERD (gastroesophageal reflux disease)   . Arthritis     ROS:   All systems reviewed and negative except as noted in the HPI.   Past Surgical History  Procedure Date  . S/p ppm (medtronic sigma ddd 12/03  . Tonsillectomy     age 19  . Appendectomy 1946  . Vasectomy 1968  . Right knee cartilage removed [id number]  . Eye surgery 02/01/2007,09/02/2007    left eye, right eye  . Vocal cord growth  02/1999    removed growth on vocal cords  . Insert / replace / remove pacemaker 07/07/2002  . Right shoulder 08/26/1990    arthroscopy  . Total knee arthroplasty 12/11/2011    Procedure: TOTAL KNEE ARTHROPLASTY;  Surgeon: Loanne Drilling, MD;  Location: [facility];  Service: Orthopedics;  Laterality: Right;  . Steriod injection 12/11/2011    Procedure: STEROID INJECTION;  Surgeon: Loanne Drilling, MD;  Location: [facility]  [facility];  Service: Orthopedics;  Laterality: Left;     Family History  Problem Relation Age of Onset  . Coronary artery disease Neg Hx      History   Social History  . Marital Status: Married    Spouse Name: N/A    Number of Children: N/A  . Years of Education: N/A   Occupational History  . Not on file.   Social History Main Topics  . Smoking status: Former Smoker -- 3.0 packs/day for 15 years    Types: Cigarettes    Quit date: 12/04/1963  . Smokeless tobacco: Not on file  . Alcohol Use: No  . Drug Use: No     Rare ETOH   . Sexually Active: Not on file   Other Topics Concern  . Not on file   Social History Narrative  . No narrative on file     BP 144/75  Pulse 56  Ht 6\' 2"  (1.88 m)  Wt 227 lb (102.967 kg)  BMI 29.15 kg/m2  SpO2 97%  Physical Exam:  Well appearing 78 year old man, NAD HEENT: Unremarkable Neck:  No JVD, no thyromegally Lungs:  Clear with no wheezes, rales, or rhonchi. HEART:  Regular rate rhythm, no murmurs, no rubs, no clicks Abd:  soft,  positive bowel sounds, no organomegally, no rebound, no guarding Ext:  2 plus pulses, no edema, no cyanosis, no clubbing Skin:  No rashes no nodules Neuro:  CN II through XII intact, motor grossly intact  DEVICE  Normal device function.  See PaceArt for details.   Assess/Plan:

## 2012-06-04 NOTE — Assessment & Plan Note (Signed)
His blood pressure remains fairly well-controlled. He will continue his current medical therapy, and maintain a low-sodium diet.

## 2012-06-04 NOTE — Patient Instructions (Signed)
Your physician wants you to follow-up in: 12 months with Dr Court Joy will receive a reminder letter in the mail two months in advance. If you don't receive a letter, please call our office to schedule the follow-up appointment.  mednet every 3 months

## 2012-09-05 DIAGNOSIS — I459 Conduction disorder, unspecified: Secondary | ICD-10-CM

## 2012-12-15 DIAGNOSIS — I498 Other specified cardiac arrhythmias: Secondary | ICD-10-CM

## 2013-02-24 ENCOUNTER — Telehealth: Payer: Self-pay | Admitting: *Deleted

## 2013-02-24 NOTE — Telephone Encounter (Signed)
Patient's wife called today worried because her husband has been complaining off and on for 3 weeks of having spells of dizziness, heart fluttering and SOB.  These spells are happening regularly at least every other day if not every day and now she is worried.  She states he is not a complainer and so when he does she listens.  I have added him on to see the PA tomorrow to have his device interrogated.  Wife aware of appointment date and time

## 2013-02-25 ENCOUNTER — Ambulatory Visit (INDEPENDENT_AMBULATORY_CARE_PROVIDER_SITE_OTHER): Payer: Medicare FFS | Admitting: Cardiology

## 2013-02-25 ENCOUNTER — Encounter: Payer: Self-pay | Admitting: Cardiology

## 2013-02-25 VITALS — BP 118/64 | HR 53 | Ht 74.0 in | Wt 228.6 lb

## 2013-02-25 DIAGNOSIS — I10 Essential (primary) hypertension: Secondary | ICD-10-CM

## 2013-02-25 DIAGNOSIS — I495 Sick sinus syndrome: Secondary | ICD-10-CM

## 2013-02-25 DIAGNOSIS — R55 Syncope and collapse: Secondary | ICD-10-CM

## 2013-02-25 DIAGNOSIS — Z950 Presence of cardiac pacemaker: Secondary | ICD-10-CM

## 2013-02-25 LAB — PACEMAKER DEVICE OBSERVATION
AL AMPLITUDE: 2.8 mv
AL THRESHOLD: 1.5 V
AL THRESHOLD: 1.5 V
ATRIAL PACING PM: 59.6
RV LEAD IMPEDENCE PM: 770 Ohm

## 2013-02-25 MED ORDER — LOSARTAN POTASSIUM-HCTZ 100-25 MG PO TABS: 0.5000 | ORAL_TABLET | Freq: Every day | ORAL | Status: DC

## 2013-02-25 NOTE — Patient Instructions (Addendum)
Reduce Hyzaar to 1/2 tablet daily  You have a Follow up appt scheduled with Rick Duff, PA-C on 03/21/13 @1pm  with Dr.Taylor also in the office

## 2013-02-25 NOTE — Progress Notes (Signed)
ELECTROPHYSIOLOGY OFFICE NOTE  Patient ID: Maurice Hall MRN: [id number], DOB/AGE: 1931-06-23   Date of Visit: 02/25/2013  Primary Physician: Maurice Rakes, MD Primary EP: Maurice Bunting, MD Reason for Visit: Palpitations, SOB and dizziness  History of Present Illness  Maurice Hall is a 79 y.o. male with sinus node dysfunction s/p PPM implant, possible PAF (found on device interrogation previously - duration 30 minutes) and HTN who presents today for evaluation of palpitations, SOB and dizziness.   Since last being seen in our clinic, he reports he has experienced intermittent dizziness and near syncope. He has warning of lightheadedness and diaphoresis. His wife reports that he appears very pale. He usually will sit down and place his head between his legs with improvement. He denies frank syncope. He reports occasional palpitations with these episodes. He denies chest pain. He denies exertional symptoms and works out at [name] vigorously without difficulty or limitation. 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 He denies LE swelling, orthopnea, PND or recent weight gain. He is compliant and tolerating medications without difficulty. Of note, he states he is trying to lose weight. His wife is concerned that he is not drinking enough water.   During his visit today, he experienced an episode of lightheadedness followed by near syncope. He appeared pale and diaphoretic. He was slightly nauseated. He states these symptoms are like what he has been experiencing at home. His BP dropped to 94/58. His wife confirmed that this was a typical episode for him. He denies CP or SOB.   Past Medical History Past Medical History  Diagnosis Date  . HTN (hypertension)   . Symptomatic bradycardia   . Dyslipidemia   . Pacemaker   . Dysrhythmia 2003    sinoatrial node dysfunction  . GERD (gastroesophageal reflux disease)   . Arthritis     Past Surgical History Past Surgical  History  Procedure Laterality Date  . S/p ppm (medtronic sigma ddd  12/03  . Tonsillectomy      age 11  . Appendectomy  1946  . Vasectomy  1968  . Right knee cartilage removed  [id number]  . Eye surgery  02/01/2007,09/02/2007    left eye, right eye  . Vocal cord growth   02/1999    removed growth on vocal cords  . Insert / replace / remove pacemaker  07/07/2002  . Right shoulder  08/26/1990    arthroscopy  . Total knee arthroplasty  12/11/2011    Procedure: TOTAL KNEE ARTHROPLASTY;  Surgeon: Loanne Drilling, MD;  Location: [facility];  Service: Orthopedics;  Laterality: Right;  . Steriod injection  12/11/2011    Procedure: STEROID INJECTION;  Surgeon: Loanne Drilling, MD;  Location: [facility];  Service: Orthopedics;  Laterality: Left;    Allergies/Intolerances No Known Allergies  Current Home Medications Current Outpatient Prescriptions  Medication Sig Dispense Refill  . aspirin 81 MG tablet Take 81 mg by mouth daily.      Marland Kitchen losartan-hydrochlorothiazide (HYZAAR) 100-25 MG per tablet Take 1 tablet by mouth daily.      . meloxicam (MOBIC) 7.5 MG tablet Take 7.5 mg by mouth daily with breakfast.       . simvastatin (ZOCOR) 20 MG tablet Take 20 mg by mouth at bedtime.        No current facility-administered medications for this visit.   Social History Social History  . Marital Status: Married   Social History Main Topics  . Smoking status: Former  Smoker -- 3.00 packs/day for 15 years    Types: Cigarettes    Quit date: 12/04/1963  . Smokeless tobacco: Not on file  . Alcohol Use: No  . Drug Use: No     Comment: Rare ETOH     Review of Systems General: No chills, fever, night sweats or weight changes Cardiovascular: No chest pain, dyspnea on exertion, edema, orthopnea, palpitations, paroxysmal nocturnal dyspnea Dermatological: No rash, lesions or masses Respiratory: No cough, dyspnea Urologic: No hematuria, dysuria Abdominal: No nausea, vomiting, diarrhea, bright red blood per rectum,  melena, or hematemesis Neurologic: No visual changes, weakness, changes in mental status All other systems reviewed and are otherwise negative except as noted above.  Physical Exam Vitals: Blood pressure 118/64, pulse 53, height 6\' 2"  (1.88 m), weight 228 lb 9.6 oz (103.692 kg), SpO2 96.00%.  General: Well developed, well appearing 79 y.o. male in no acute distress. HEENT: Normocephalic, atraumatic. EOMs intact. Sclera nonicteric. Oropharynx clear.  Neck: Supple. No JVD. Lungs: Respirations regular and unlabored, CTA bilaterally. No wheezes, rales or rhonchi. Heart: RRR. S1, S2 present. No murmurs, rub, S3 or S4. Abdomen: Soft, non-distended.  Extremities: No clubbing, cyanosis or edema. PT/Radials 2+ and equal bilaterally. Psych: Normal affect. Neuro: Alert and oriented X 3. Moves all extremities spontaneously.   Diagnostics Device interrogation today - Normal device function. Thresholds, sensing, impedances consistent with previous measurements. Device programmed to maximize longevity. 563 AHR episodes, only 1 recorded as an atrial rate >400 on 06/07/2012 lasting 3 hours in duration, no EGM available. 1 VHR episode, 3 seconds in duration. Device programmed at appropriate safety margins. Histogram distribution appropriate for patient activity level. Device programmed to optimize intrinsic conduction. Estimated longevity 17 months.  Assessment and Plan 1. Vasovagal syncope Feeling better now Likely dehydration aggravating symptoms Will cut Hyzaar dose in half today and follow Counseled regarding importance of adequate hydration and counterpressure measures 2. ? PAF AHR - 3 hours in duration back in Nov 2013, none since; no EGM for review Will have Dr. Ladona Hall review interrogation CHADS2VASc score 2 for age and HTN so will need chronic anticoagulation (has taken Xarelto before for DVT prophylaxis after knee surgery - tolerated well) Return in 2 weeks to discuss Dr. Lubertha Hall  recommendations 3. Sinus node dysfunction s/p PPM implant Normal device function Battery longevity estimated at 17 months Continue monthly TTMs See PaceArt report for full details 4. HTN Had vasovagal syncope in office Likely dehydrated Will cut Hyzaar dose in half today Recheck BP in 2 weeks   Signed, Rick Duff, PA-C 02/25/2013, 1:41 PM

## 2013-03-06 DIAGNOSIS — I459 Conduction disorder, unspecified: Secondary | ICD-10-CM

## 2013-03-11 ENCOUNTER — Encounter: Payer: Self-pay | Admitting: Cardiology

## 2013-03-11 ENCOUNTER — Ambulatory Visit (INDEPENDENT_AMBULATORY_CARE_PROVIDER_SITE_OTHER): Payer: Medicare FFS | Admitting: Cardiology

## 2013-03-11 VITALS — BP 113/69 | HR 57 | Ht 74.0 in | Wt 230.0 lb

## 2013-03-11 DIAGNOSIS — I10 Essential (primary) hypertension: Secondary | ICD-10-CM

## 2013-03-11 DIAGNOSIS — R55 Syncope and collapse: Secondary | ICD-10-CM

## 2013-03-11 DIAGNOSIS — I495 Sick sinus syndrome: Secondary | ICD-10-CM

## 2013-03-11 DIAGNOSIS — Z950 Presence of cardiac pacemaker: Secondary | ICD-10-CM

## 2013-03-11 DIAGNOSIS — I4891 Unspecified atrial fibrillation: Secondary | ICD-10-CM

## 2013-03-11 NOTE — Patient Instructions (Signed)
Your physician recommends that you continue on your current medications as directed. Please refer to the Current Medication list given to you today.     

## 2013-03-12 ENCOUNTER — Other Ambulatory Visit: Payer: Self-pay | Admitting: *Deleted

## 2013-03-12 DIAGNOSIS — Z79899 Other long term (current) drug therapy: Secondary | ICD-10-CM

## 2013-03-12 NOTE — Progress Notes (Signed)
ELECTROPHYSIOLOGY OFFICE NOTE  Patient ID: Maurice Hall MRN: [id number], DOB/AGE: 12-28-1930   Date of Visit: 03/11/2013  Primary Physician: Charlott Rakes, MD Primary EP: Lewayne Bunting, MD Reason for Visit: Follow-up for AF  History of Present Illness  Maurice Hall is a 79 y.o. male with sinus node dysfunction s/p PPM implant, PAF (documented on device interrogation previously - duration 30 minutes Oct 2013), vasovagal syncope and HTN who presents today for follow-up regarding AF.   Since last being seen in our clinic, he reports he is doing well and has no complaints. He denies chest pain or SOB. He denies syncope. He denies exertional symptoms and works out at [name] vigorously without difficulty or limitation. 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 He denies LE swelling, orthopnea, PND or recent weight gain. He is compliant and tolerating medications without difficulty. When he was here 2 weeks ago for device follow-up, he was found to have more AF of greater duration ~3 hours which was asymptomatic. He returns today to discuss anticoagulation. He has no history of bleeding or clotting disorder. He denies recent bleeding or recent surgery. He underwent knee replacement surgery back in October 2013 at which time he was placed on Xarelto for DVT prophylaxis. He tolerated this medication well.    Past Medical History Past Medical History  Diagnosis Date  . HTN (hypertension)   . Symptomatic bradycardia   . Dyslipidemia   . Pacemaker   . Dysrhythmia 2003    sinoatrial node dysfunction  . GERD (gastroesophageal reflux disease)   . Arthritis     Past Surgical History Past Surgical History  Procedure Laterality Date  . S/p ppm (medtronic sigma ddd  12/03  . Tonsillectomy      age 64  . Appendectomy  1946  . Vasectomy  1968  . Right knee cartilage removed  [id number]  . Eye surgery  02/01/2007,09/02/2007    left eye, right eye  . Vocal cord growth    02/1999    removed growth on vocal cords  . Insert / replace / remove pacemaker  07/07/2002  . Right shoulder  08/26/1990    arthroscopy  . Total knee arthroplasty  12/11/2011    Procedure: TOTAL KNEE ARTHROPLASTY;  Surgeon: Loanne Drilling, MD;  Location: [facility];  Service: Orthopedics;  Laterality: Right;  . Steriod injection  12/11/2011    Procedure: STEROID INJECTION;  Surgeon: Loanne Drilling, MD;  Location: [facility];  Service: Orthopedics;  Laterality: Left;    Allergies/Intolerances No Known Allergies  Current Home Medications Current Outpatient Prescriptions  Medication Sig Dispense Refill  . aspirin 81 MG tablet Take 81 mg by mouth daily.      . fish oil-omega-3 fatty acids 1000 MG capsule Take 2 g by mouth 3 (three) times daily.      . Glucosamine-Chondroitin (GLUCOSAMINE CHONDR COMPLEX PO) Take 1,500 mg by mouth 2 (two) times daily.      Marland Kitchen losartan-hydrochlorothiazide (HYZAAR) 100-25 MG per tablet Take 1 tablet by mouth daily.      . meloxicam (MOBIC) 7.5 MG tablet Take 7.5 mg by mouth daily with breakfast.       . simvastatin (ZOCOR) 20 MG tablet Take 20 mg by mouth at bedtime.        No current facility-administered medications for this visit.   Social History Social History  . Marital Status: Married   Social History Main Topics  . Smoking status: Former Smoker --  3.00 packs/day for 15 years    Types: Cigarettes    Quit date: 12/04/1963  . Smokeless tobacco: No  . Alcohol Use: Rarely  . Drug Use: No   Review of Systems General: No chills, fever, night sweats or weight changes Cardiovascular: No chest pain, dyspnea on exertion, edema, orthopnea, palpitations, paroxysmal nocturnal dyspnea Dermatological: No rash, lesions or masses Respiratory: No cough, dyspnea Urologic: No hematuria, dysuria Abdominal: No nausea, vomiting, diarrhea, bright red blood per rectum, melena, or hematemesis Neurologic: No visual changes, weakness, changes in mental status All other systems  reviewed and are otherwise negative except as noted above.  Physical Exam Vitals: Blood pressure 113/69, pulse 57, height 6\' 2"  (1.88 m), weight 230 lb (104.327 kg).  General: Well developed, well appearing 79 y.o. male in no acute distress. HEENT: Normocephalic, atraumatic. EOMs intact. Sclera nonicteric. Oropharynx clear.  Neck: Supple without bruits. No JVD. Lungs: Respirations regular and unlabored, CTA bilaterally. No wheezes, rales or rhonchi. Heart: RRR. S1, S2 present. No murmurs, rub, S3 or S4. Abdomen: Soft, non-tender, non-distended. BS present x 4 quadrants. No hepatosplenomegaly.  Extremities: No clubbing, cyanosis or edema. DP/PT/Radials 2+ and equal bilaterally. Psych: Normal affect. Neuro: Alert and oriented X 3. Moves all extremities spontaneously.   Diagnostics Device interrogation from 02/25/2013 was reviewed in full with Dr. Ladona Ridgel today  Assessment and Plan 1. PAF  - AHR episode - 3 hours in duration back in Nov 2013, none since; no EGM for review  - Dr. Ladona Ridgel reviewed interrogation in full - agrees PAF - CHADS2VASc score 2 for age and HTN so will need chronic anticoagulation (has taken Xarelto before for DVT prophylaxis after knee surgery - tolerated well)  - Will check BMET and start Xarelto if renal function normal 2. Vasovagal syncope  - Stable - Counseled regarding importance of adequate hydration and counterpressure measures  3. Sinus node dysfunction s/p PPM implant  - Normal device function by interrogation 02/25/2013 - Battery longevity estimated at 17 months  - Continue monthly TTMs  4. HTN  - Stable; continue current regimen  This plan of care was formulated with Dr. Ladona Ridgel who was in to interview and examine the patient with me today. Signed, Rick Duff, PA-C 03/12/2013, 2:41 PM

## 2013-03-12 NOTE — Progress Notes (Signed)
Spoke with patient in regards to him coming back to office to get BMET checked before he can start Xarelto. Pt agreed to come into [address] office to get labs 03-18-2013 at 9:55am. Informed Brooke as well of the plan and she agreed.

## 2013-03-18 ENCOUNTER — Other Ambulatory Visit (INDEPENDENT_AMBULATORY_CARE_PROVIDER_SITE_OTHER): Payer: Medicare FFS

## 2013-03-18 DIAGNOSIS — Z79899 Other long term (current) drug therapy: Secondary | ICD-10-CM

## 2013-03-18 DIAGNOSIS — Z0100 Encounter for examination of eyes and vision without abnormal findings: Secondary | ICD-10-CM

## 2013-03-18 LAB — BASIC METABOLIC PANEL
CO2: 28 mEq/L (ref 19–32)
Calcium: 9.5 mg/dL (ref 8.4–10.5)
Creatinine, Ser: 1.2 mg/dL (ref 0.4–1.5)
Glucose, Bld: 91 mg/dL (ref 70–99)

## 2013-03-25 ENCOUNTER — Telehealth: Payer: Self-pay | Admitting: *Deleted

## 2013-03-25 MED ORDER — RIVAROXABAN 20 MG PO TABS: 20.0000 mg | ORAL_TABLET | Freq: Every day | ORAL | Status: DC

## 2013-03-25 NOTE — Telephone Encounter (Signed)
Message copied by Deliah Boston on Tue Mar 25, 2013  3:29 PM ------      Message from: Minda Meo      Created: Thu Mar 20, 2013 12:38 AM       Normal renal function. CrCl is 73. Please call patient and instruct him to start Xarelto 20 mg once daily, as discussed in the office last week, for stroke risk reduction in setting of PAF. Thank you. -BE ------

## 2013-03-25 NOTE — Telephone Encounter (Signed)
Spoke with patients wife and will call in medication Also told her to stop ASA and take the Mobic as needed if possible.  If not take with food and report to [place] any blood in stool or urine

## 2013-05-12 ENCOUNTER — Encounter: Payer: Self-pay | Admitting: Internal Medicine

## 2013-06-12 ENCOUNTER — Other Ambulatory Visit: Payer: Self-pay

## 2013-07-08 ENCOUNTER — Ambulatory Visit (INDEPENDENT_AMBULATORY_CARE_PROVIDER_SITE_OTHER): Payer: Medicare FFS | Admitting: Internal Medicine

## 2013-07-08 ENCOUNTER — Encounter: Payer: Self-pay | Admitting: Internal Medicine

## 2013-07-08 VITALS — BP 144/74 | HR 59 | Ht 74.0 in | Wt 234.0 lb

## 2013-07-08 DIAGNOSIS — Z950 Presence of cardiac pacemaker: Secondary | ICD-10-CM

## 2013-07-08 DIAGNOSIS — I4891 Unspecified atrial fibrillation: Secondary | ICD-10-CM

## 2013-07-08 DIAGNOSIS — I10 Essential (primary) hypertension: Secondary | ICD-10-CM

## 2013-07-08 DIAGNOSIS — I495 Sick sinus syndrome: Secondary | ICD-10-CM

## 2013-07-08 LAB — MDC_IDC_ENUM_SESS_TYPE_INCLINIC
Battery Impedance: 3739 Ohm
Battery Voltage: 2.73 V
Lead Channel Impedance Value: 477 Ohm
Lead Channel Impedance Value: 779 Ohm
Lead Channel Pacing Threshold Pulse Width: 0.4 ms
Lead Channel Setting Pacing Amplitude: 2 V
Lead Channel Setting Pacing Amplitude: 2.5 V
Lead Channel Setting Sensing Sensitivity: 2.8 mV

## 2013-07-08 NOTE — Progress Notes (Signed)
HPI [name] returns today for followup. He is a 79 yo man with a h/o symptomatic bradycardia, HTN, and arthritis, s/p PPM. In the interim he has done well. 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 He denies chest pain, shortness of breath, or peripheral edema. No syncope. He is bothered by some arthritis in his knee. No Known Allergies   Current Outpatient Prescriptions  Medication Sig Dispense Refill  . fish oil-omega-3 fatty acids 1000 MG capsule Take 2 g by mouth 3 (three) times daily.      . Glucosamine-Chondroitin (GLUCOSAMINE CHONDR COMPLEX PO) Take 1,500 mg by mouth 2 (two) times daily.      Marland Kitchen losartan-hydrochlorothiazide (HYZAAR) 100-25 MG per tablet Take 1 tablet by mouth daily.      . meloxicam (MOBIC) 7.5 MG tablet Take 7.5 mg by mouth daily with breakfast.       . Rivaroxaban (XARELTO) 20 MG TABS tablet Take 1 tablet (20 mg total) by mouth daily.  30 tablet  11  . simvastatin (ZOCOR) 20 MG tablet Take 20 mg by mouth at bedtime.        No current facility-administered medications for this visit.     Past Medical History  Diagnosis Date  . HTN (hypertension)   . Symptomatic bradycardia   . Dyslipidemia   . Pacemaker   . Dysrhythmia 2003    sinoatrial node dysfunction  . GERD (gastroesophageal reflux disease)   . Arthritis     ROS:   All systems reviewed and negative except as noted in the HPI.   Past Surgical History  Procedure Laterality Date  . S/p ppm (medtronic sigma ddd  12/03  . Tonsillectomy      age 40  . Appendectomy  1946  . Vasectomy  1968  . Right knee cartilage removed  [id number]  . Eye surgery  02/01/2007,09/02/2007    left eye, right eye  . Vocal cord growth   02/1999    removed growth on vocal cords  . Insert / replace / remove pacemaker  07/07/2002  . Right shoulder  08/26/1990    arthroscopy  . Total knee arthroplasty  12/11/2011    Procedure: TOTAL KNEE ARTHROPLASTY;  Surgeon: Loanne Drilling, MD;  Location: [facility];  Service:  Orthopedics;  Laterality: Right;  . Steriod injection  12/11/2011    Procedure: STEROID INJECTION;  Surgeon: Loanne Drilling, MD;  Location: [facility];  Service: Orthopedics;  Laterality: Left;     Family History  Problem Relation Age of Onset  . Coronary artery disease Neg Hx      History   Social History  . Marital Status: Married    Spouse Name: N/A    Number of Children: N/A  . Years of Education: N/A   Occupational History  . Not on file.   Social History Main Topics  . Smoking status: Former Smoker -- 3.00 packs/day for 15 years    Types: Cigarettes    Quit date: 12/04/1963  . Smokeless tobacco: Not on file  . Alcohol Use: No  . Drug Use: No     Comment: Rare ETOH   . Sexual Activity: Not on file   Other Topics Concern  . Not on file   Social History Narrative  . No narrative on file     BP 144/74  Pulse 59  Ht 6\' 2"  (1.88 m)  Wt 234 lb (106.142 kg)  BMI 30.03 kg/m2  Physical Exam:  Well appearing 79 year old man, NAD  HEENT: Unremarkable Neck:  No JVD, no thyromegally Lungs:  Clear with no wheezes, rales, or rhonchi. HEART:  Regular rate rhythm, no murmurs, no rubs, no clicks Abd:  soft, positive bowel sounds, no organomegally, no rebound, no guarding Ext:  2 plus pulses, no edema, no cyanosis, no clubbing Skin:  No rashes no nodules Neuro:  CN II through XII intact, motor grossly intact  DEVICE  Normal device function.  See PaceArt for details.   Assess/Plan:

## 2013-07-08 NOTE — Assessment & Plan Note (Signed)
The patient's pacemaker interrogation demonstrates that he has normal sinus rhythm 99.6% of the time. He'll continue anticoagulation and his current medical therapy.

## 2013-07-08 NOTE — Assessment & Plan Note (Signed)
His device is approaching elective replacement. He is a Medtronic dual-chamber device. We will follow him on a regular basis  By phone. I will plan to see him back in one year.

## 2013-07-08 NOTE — Patient Instructions (Signed)
Your physician wants you to follow-up in: 12 months with Dr. Taylor. You will receive a reminder letter in the mail two months in advance. If you don't receive a letter, please call our office to schedule the follow-up appointment.    

## 2013-07-08 NOTE — Assessment & Plan Note (Signed)
His blood pressure remains reasonably well controlled. No change in medical therapy.

## 2013-08-08 ENCOUNTER — Encounter: Payer: Self-pay | Admitting: Internal Medicine

## 2013-08-08 DIAGNOSIS — I498 Other specified cardiac arrhythmias: Secondary | ICD-10-CM

## 2013-09-12 ENCOUNTER — Encounter: Payer: Self-pay | Admitting: Internal Medicine

## 2013-09-12 DIAGNOSIS — I498 Other specified cardiac arrhythmias: Secondary | ICD-10-CM

## 2013-10-10 ENCOUNTER — Encounter: Payer: Self-pay | Admitting: Internal Medicine

## 2013-10-10 DIAGNOSIS — I498 Other specified cardiac arrhythmias: Secondary | ICD-10-CM

## 2013-11-07 ENCOUNTER — Encounter: Payer: Self-pay | Admitting: Internal Medicine

## 2013-11-07 DIAGNOSIS — I498 Other specified cardiac arrhythmias: Secondary | ICD-10-CM

## 2013-12-05 DIAGNOSIS — I498 Other specified cardiac arrhythmias: Secondary | ICD-10-CM

## 2014-01-09 ENCOUNTER — Encounter: Payer: Self-pay | Admitting: Internal Medicine

## 2014-01-09 DIAGNOSIS — I498 Other specified cardiac arrhythmias: Secondary | ICD-10-CM

## 2014-02-06 ENCOUNTER — Encounter: Payer: Self-pay | Admitting: Internal Medicine

## 2014-02-06 DIAGNOSIS — I498 Other specified cardiac arrhythmias: Secondary | ICD-10-CM

## 2014-03-13 DIAGNOSIS — I498 Other specified cardiac arrhythmias: Secondary | ICD-10-CM

## 2014-04-02 ENCOUNTER — Other Ambulatory Visit: Payer: Self-pay | Admitting: Internal Medicine

## 2014-04-10 ENCOUNTER — Encounter: Payer: Self-pay | Admitting: Internal Medicine

## 2014-04-16 ENCOUNTER — Encounter: Payer: Self-pay | Admitting: Internal Medicine

## 2014-04-16 DIAGNOSIS — I498 Other specified cardiac arrhythmias: Secondary | ICD-10-CM

## 2014-05-18 ENCOUNTER — Encounter: Payer: Self-pay | Admitting: Internal Medicine

## 2014-05-18 DIAGNOSIS — R001 Bradycardia, unspecified: Secondary | ICD-10-CM

## 2014-06-17 ENCOUNTER — Encounter: Payer: Self-pay | Admitting: Internal Medicine

## 2014-06-17 DIAGNOSIS — R001 Bradycardia, unspecified: Secondary | ICD-10-CM

## 2014-07-14 ENCOUNTER — Encounter: Payer: Self-pay | Admitting: Internal Medicine

## 2014-07-14 ENCOUNTER — Ambulatory Visit (INDEPENDENT_AMBULATORY_CARE_PROVIDER_SITE_OTHER): Payer: Medicare FFS | Admitting: Internal Medicine

## 2014-07-14 VITALS — BP 114/70 | HR 61 | Ht 74.0 in | Wt 233.8 lb

## 2014-07-14 DIAGNOSIS — Z950 Presence of cardiac pacemaker: Secondary | ICD-10-CM

## 2014-07-14 DIAGNOSIS — R0600 Dyspnea, unspecified: Secondary | ICD-10-CM

## 2014-07-14 DIAGNOSIS — I4891 Unspecified atrial fibrillation: Secondary | ICD-10-CM

## 2014-07-14 DIAGNOSIS — I10 Essential (primary) hypertension: Secondary | ICD-10-CM

## 2014-07-14 DIAGNOSIS — I495 Sick sinus syndrome: Secondary | ICD-10-CM

## 2014-07-14 LAB — MDC_IDC_ENUM_SESS_TYPE_INCLINIC
Lead Channel Pacing Threshold Amplitude: 1 V
Lead Channel Pacing Threshold Amplitude: 1 V
Lead Channel Pacing Threshold Pulse Width: 0.4 ms
Lead Channel Setting Pacing Amplitude: 2 V
Lead Channel Setting Pacing Amplitude: 2.5 V
Lead Channel Setting Sensing Sensitivity: 2.8 mV
MDC IDC MSMT BATTERY VOLTAGE: 2.72 V
MDC IDC MSMT LEADCHNL RA PACING THRESHOLD PULSEWIDTH: 0.8 ms
MDC IDC MSMT LEADCHNL RA SENSING INTR AMPL: 2.8 mV
MDC IDC MSMT LEADCHNL RV SENSING INTR AMPL: 11.2 mV
MDC IDC SET LEADCHNL RV PACING PULSEWIDTH: 0.4 ms

## 2014-07-14 NOTE — Assessment & Plan Note (Signed)
His blood pressure today is well controlled. He'll continue his current medical therapy. He is encouraged to maintain a low-sodium diet.

## 2014-07-14 NOTE — Assessment & Plan Note (Signed)
His symptoms are of unclear etiology. I do not think he is having angina but am concerned about worsening LV dysfunction. As he has a PPM, and paces some, i would like for him to undergo a stress echo. He may require lasix.

## 2014-07-14 NOTE — Patient Instructions (Signed)
Your physician wants you to follow-up in: 12 months with Dr. Court Joyaylor You will receive a reminder letter in the mail two months in advance. If you don't receive a letter, please call our office to schedule the follow-up appointment.  Your physician has requested that you have a stress echocardiogram. For further information please visit [email or web address]. Please follow instruction sheet as given.

## 2014-07-14 NOTE — Progress Notes (Signed)
HPI Mr. Maurice RecordsMicka returns today for followup. He is a 80 yo man with a h/o symptomatic bradycardia, HTN, and arthritis, s/p PPM. In the interim he has developed dyspnea with exertion. No rest symptoms and no chest pain. No peripheral edema. He has also developed painful joints and was found to have psoriatic arthritis. He was initially placed on prednisone, and then switched to methotrexate. His joint discomfort is improved, but still present. He wonders whether his medications for his arthritis could somehow be affecting his breathing.  No Known Allergies   Current Outpatient Prescriptions  Medication Sig Dispense Refill  . fish oil-omega-3 fatty acids 1000 MG capsule Take 1 g by mouth 3 (three) times daily.     . folic acid (FOLVITE) 1 MG tablet Take 1 tablet by mouth daily.    . Glucosamine-Chondroitin (GLUCOSAMINE CHONDR COMPLEX PO) Take 1,500 mg by mouth 2 (two) times daily.    Marland Kitchen. losartan-hydrochlorothiazide (HYZAAR) 100-25 MG per tablet Take 1 tablet by mouth daily.    . meloxicam (MOBIC) 7.5 MG tablet Take 7.5 mg by mouth daily with breakfast.     . methotrexate (RHEUMATREX) 2.5 MG tablet Take 8 tablets by mouth once a week.    Marland Kitchen. oxybutynin (DITROPAN) 5 MG tablet Take 2 tablets by mouth daily.    . simvastatin (ZOCOR) 20 MG tablet Take 20 mg by mouth at bedtime.     . tamsulosin (FLOMAX) 0.4 MG CAPS capsule Take 1 capsule by mouth daily.    Carlena Hurl. XARELTO 20 MG TABS tablet TAKE ONE TABLET BY MOUTH ONCE DAILY 30 tablet 3   No current facility-administered medications for this visit.     Past Medical History  Diagnosis Date  . HTN (hypertension)   . Symptomatic bradycardia   . Dyslipidemia   . Pacemaker   . Dysrhythmia 2003    sinoatrial node dysfunction  . GERD (gastroesophageal reflux disease)   . Arthritis     ROS:   All systems reviewed and negative except as noted in the HPI.   Past Surgical History  Procedure Laterality Date  . S/p ppm (medtronic sigma ddd  12/03  .  Tonsillectomy      age 56  . Appendectomy  1946  . Vasectomy  1968  . Right knee cartilage removed  [id number]  . Eye surgery  02/01/2007,09/02/2007    left eye, right eye  . Vocal cord growth   02/1999    removed growth on vocal cords  . Insert / replace / remove pacemaker  07/07/2002  . Right shoulder  08/26/1990    arthroscopy  . Total knee arthroplasty  12/11/2011    Procedure: TOTAL KNEE ARTHROPLASTY;  Surgeon: Loanne DrillingFrank V Aluisio, MD;  Location: [facility];  Service: Orthopedics;  Laterality: Right;  . Steriod injection  12/11/2011    Procedure: STEROID INJECTION;  Surgeon: Loanne DrillingFrank V Aluisio, MD;  Location: [facility];  Service: Orthopedics;  Laterality: Left;     Family History  Problem Relation Age of Onset  . Coronary artery disease Neg Hx      History   Social History  . Marital Status: Married    Spouse Name: N/A    Number of Children: N/A  . Years of Education: N/A   Occupational History  . Not on file.   Social History Main Topics  . Smoking status: Former Smoker -- 3.00 packs/day for 15 years    Types: Cigarettes    Quit date: 12/04/1963  . Smokeless tobacco: Not on file  .  Alcohol Use: No  . Drug Use: No     Comment: Rare ETOH   . Sexual Activity: Not on file   Other Topics Concern  . Not on file   Social History Narrative     BP 114/70 mmHg  Pulse 61  Ht 6\' 2"  (1.88 m)  Wt 233 lb 12.8 oz (106.051 kg)  BMI 30.01 kg/m2  Physical Exam:  Well appearing 80 year old man, NAD HEENT: Unremarkable Neck:  No JVD, no thyromegally Lungs:  Clear with no wheezes, rales, or rhonchi. HEART:  Regular rate rhythm, no murmurs, no rubs, no clicks Abd:  soft, positive bowel sounds, no organomegally, no rebound, no guarding Ext:  2 plus pulses, no edema, no cyanosis, no clubbing Skin:  No rashes no nodules Neuro:  CN II through XII intact, motor grossly intact  DEVICE  Normal device function.  See PaceArt for details. He is approximately 9 months from elective  replacement.  Assess/Plan:

## 2014-07-14 NOTE — Assessment & Plan Note (Signed)
His Medtronic dual-chamber pacemaker is working normally. He is approximately 9 months from elective replacement. We'll plan to continue monitoring.

## 2014-07-14 NOTE — Assessment & Plan Note (Signed)
He is maintaining sinus rhythm over 99% of the time. No change in medical therapy. 

## 2014-07-22 ENCOUNTER — Other Ambulatory Visit (HOSPITAL_COMMUNITY): Payer: Medicare FFS

## 2014-07-24 ENCOUNTER — Other Ambulatory Visit (HOSPITAL_COMMUNITY): Payer: Medicare FFS

## 2014-07-28 ENCOUNTER — Other Ambulatory Visit (HOSPITAL_COMMUNITY): Payer: Self-pay | Admitting: Internal Medicine

## 2014-07-28 ENCOUNTER — Ambulatory Visit (HOSPITAL_COMMUNITY): Payer: Medicare FFS | Attending: Cardiology

## 2014-07-28 ENCOUNTER — Ambulatory Visit (HOSPITAL_BASED_OUTPATIENT_CLINIC_OR_DEPARTMENT_OTHER): Payer: Medicare FFS

## 2014-07-28 ENCOUNTER — Other Ambulatory Visit (HOSPITAL_COMMUNITY): Payer: Medicare FFS | Admitting: Radiology

## 2014-07-28 DIAGNOSIS — I4891 Unspecified atrial fibrillation: Secondary | ICD-10-CM

## 2014-07-28 DIAGNOSIS — R5383 Other fatigue: Secondary | ICD-10-CM | POA: Insufficient documentation

## 2014-07-28 DIAGNOSIS — R0600 Dyspnea, unspecified: Secondary | ICD-10-CM | POA: Diagnosis present

## 2014-07-28 DIAGNOSIS — R0989 Other specified symptoms and signs involving the circulatory and respiratory systems: Secondary | ICD-10-CM

## 2014-07-28 MED ORDER — SODIUM CHLORIDE 0.9 % IV SOLN
30.0000 ug/kg | Freq: Once | INTRAVENOUS | Status: AC
  Administered 2014-07-28: 30 ug/kg/min via INTRAVENOUS

## 2014-07-28 NOTE — Progress Notes (Signed)
Exercise stress echo cannot be completed due to fatigue before target heart rate.  Dobutamine stress echo was then done.  Dobutamine stress echo completed 07/28/2014.

## 2014-08-03 ENCOUNTER — Telehealth: Payer: Self-pay | Admitting: Internal Medicine

## 2014-08-03 NOTE — Telephone Encounter (Signed)
Spoke with wife, she is aware of the results.  Let her know if Dr Ladona Ridgelaylor starts a fluid pill will call her back as he mentioned this in his office note

## 2014-08-03 NOTE — Telephone Encounter (Signed)
New problem   Pt's wife calling   Pt want to know results of his stress test that was done on 07/28/14. Pt's wife stated they could see any results on MyChart. Please advise.

## 2014-08-06 ENCOUNTER — Other Ambulatory Visit: Payer: Self-pay | Admitting: Internal Medicine

## 2014-08-10 ENCOUNTER — Encounter: Payer: Self-pay | Admitting: Internal Medicine

## 2014-08-10 DIAGNOSIS — R001 Bradycardia, unspecified: Secondary | ICD-10-CM

## 2014-08-12 ENCOUNTER — Encounter: Payer: Self-pay | Admitting: Internal Medicine

## 2014-08-12 MED ORDER — FUROSEMIDE 20 MG PO TABS: 20.0000 mg | ORAL_TABLET | Freq: Every day | ORAL | Status: DC

## 2014-08-12 NOTE — Telephone Encounter (Signed)
New message ° ° ° ° °Want stress test results °

## 2014-08-12 NOTE — Telephone Encounter (Signed)
Spoke with Dr Ladona Ridgelaylor yesterday and he advised that the patient start Furosemide 20mg  daily.  Take for one week if better continue and if no better stop the medication.  I called and spoke with patients wife and will call in the new RX for him.  She also stated she felt some of his symptoms my be coming from a drug he is on for arthritis.

## 2014-08-12 NOTE — Telephone Encounter (Signed)
This encounter was created in error - please disregard.

## 2014-09-11 ENCOUNTER — Encounter: Payer: Self-pay | Admitting: Internal Medicine

## 2014-09-11 DIAGNOSIS — R55 Syncope and collapse: Secondary | ICD-10-CM

## 2014-09-24 ENCOUNTER — Encounter: Payer: Self-pay | Admitting: *Deleted

## 2014-09-24 ENCOUNTER — Telehealth: Payer: Self-pay | Admitting: Internal Medicine

## 2014-09-24 NOTE — Telephone Encounter (Signed)
New Message  Pt received Mychart message saying that he needs an OV, but has seen Ladona Ridgelaylor in Dec/2015 and has checks in [place]. Pt wanted to speak w/ Rn about what to do going forward. Please call back and discuss.

## 2014-09-24 NOTE — Telephone Encounter (Signed)
He needs to have his remotes done in [place]  Will forward to device clinic

## 2014-09-25 ENCOUNTER — Encounter: Payer: Self-pay | Admitting: Internal Medicine

## 2014-09-25 NOTE — Telephone Encounter (Signed)
Appointment made with the device clinic for a battery check 09/30/14.

## 2014-09-30 ENCOUNTER — Ambulatory Visit (INDEPENDENT_AMBULATORY_CARE_PROVIDER_SITE_OTHER): Payer: Medicare FFS | Admitting: *Deleted

## 2014-09-30 ENCOUNTER — Telehealth: Payer: Self-pay | Admitting: *Deleted

## 2014-09-30 DIAGNOSIS — I495 Sick sinus syndrome: Secondary | ICD-10-CM

## 2014-09-30 LAB — MDC_IDC_ENUM_SESS_TYPE_INCLINIC
Battery Impedance: 5257 Ohm
Battery Remaining Longevity: 7 mo
Battery Voltage: 2.71 V
Brady Statistic AP VP Percent: 20.2 %
Brady Statistic AS VP Percent: 0.2 %
Lead Channel Impedance Value: 519 Ohm
Lead Channel Pacing Threshold Amplitude: 0.5 V
Lead Channel Pacing Threshold Amplitude: 0.5 V
Lead Channel Pacing Threshold Pulse Width: 0.4 ms
Lead Channel Sensing Intrinsic Amplitude: 11.2 mV
Lead Channel Sensing Intrinsic Amplitude: 2.8 mV
Lead Channel Setting Pacing Amplitude: 2 V
Lead Channel Setting Pacing Amplitude: 2.5 V
Lead Channel Setting Pacing Pulse Width: 0.4 ms
Lead Channel Setting Sensing Sensitivity: 2.8 mV
MDC IDC MSMT LEADCHNL RA PACING THRESHOLD PULSEWIDTH: 0.8 ms
MDC IDC MSMT LEADCHNL RV IMPEDANCE VALUE: 795 Ohm
MDC IDC SESS DTM: 20160224105047
MDC IDC STAT BRADY AP VS PERCENT: 24.9 %
MDC IDC STAT BRADY AS VS PERCENT: 54.7 %

## 2014-09-30 NOTE — Progress Notes (Signed)
Pacemaker check in clinic. Normal device function. Thresholds, sensing, impedances consistent with previous measurements. Device programmed to maximize longevity. 132 mode switches with atrial rates of 179-400bpm, xarelto.  Total burden 0.3%.  5 high ventricular rates noted 1-3 seconds. Device programmed at appropriate safety margins. Histogram distribution appropriate for patient activity level. Device programmed to optimize intrinsic conduction. Estimated longevity <1-16 months with an average of 7 months.  Patient education completed.  ROV in March with the device clinic for battery recheck.

## 2014-09-30 NOTE — Telephone Encounter (Signed)
Per Dr. Ladona Ridgelaylor patient is to restart furosemide 20mg  daily, patient is aware and is in agreement.

## 2014-10-02 ENCOUNTER — Encounter: Payer: Self-pay | Admitting: Internal Medicine

## 2014-10-05 ENCOUNTER — Encounter: Payer: Self-pay | Admitting: *Deleted

## 2014-10-29 ENCOUNTER — Ambulatory Visit (INDEPENDENT_AMBULATORY_CARE_PROVIDER_SITE_OTHER): Payer: Medicare PPO | Admitting: *Deleted

## 2014-10-29 DIAGNOSIS — I495 Sick sinus syndrome: Secondary | ICD-10-CM

## 2014-10-29 LAB — MDC_IDC_ENUM_SESS_TYPE_INCLINIC
Brady Statistic AS VP Percent: 0.2 %
Brady Statistic AS VS Percent: 48.3 %
Lead Channel Impedance Value: 518 Ohm
MDC IDC MSMT LEADCHNL RV IMPEDANCE VALUE: 695 Ohm
MDC IDC STAT BRADY AP VP PERCENT: 25.2 %
MDC IDC STAT BRADY AP VS PERCENT: 26.3 %

## 2014-10-29 NOTE — Progress Notes (Signed)
Pacemaker check in clinic for battery. Battery longevity 6 mths with range of < 1 to 15 months. No changes made. ROV in 1 mth for battery check. Next billable in May.

## 2014-11-19 ENCOUNTER — Encounter: Payer: Self-pay | Admitting: Internal Medicine

## 2014-11-26 DIAGNOSIS — H35371 Puckering of macula, right eye: Secondary | ICD-10-CM | POA: Diagnosis not present

## 2014-11-26 DIAGNOSIS — H3530 Unspecified macular degeneration: Secondary | ICD-10-CM | POA: Diagnosis not present

## 2014-11-26 DIAGNOSIS — H5203 Hypermetropia, bilateral: Secondary | ICD-10-CM | POA: Diagnosis not present

## 2014-11-26 DIAGNOSIS — H52223 Regular astigmatism, bilateral: Secondary | ICD-10-CM | POA: Diagnosis not present

## 2014-12-03 ENCOUNTER — Encounter: Payer: Self-pay | Admitting: Internal Medicine

## 2014-12-03 ENCOUNTER — Ambulatory Visit (INDEPENDENT_AMBULATORY_CARE_PROVIDER_SITE_OTHER): Payer: Medicare PPO | Admitting: *Deleted

## 2014-12-03 DIAGNOSIS — Z45010 Encounter for checking and testing of cardiac pacemaker pulse generator [battery]: Secondary | ICD-10-CM

## 2014-12-03 LAB — MDC_IDC_ENUM_SESS_TYPE_INCLINIC
Battery Impedance: 5556
Battery Voltage: 2.67 V
Brady Statistic AP VS Percent: 23.6 %
Brady Statistic AS VS Percent: 49.1 %
Lead Channel Impedance Value: 496 Ohm
Lead Channel Impedance Value: 801 Ohm
Lead Channel Setting Pacing Amplitude: 2 V
Lead Channel Setting Pacing Amplitude: 2.5 V
Lead Channel Setting Sensing Sensitivity: 2.8 mV
MDC IDC SET LEADCHNL RV PACING PULSEWIDTH: 0.4 ms
MDC IDC STAT BRADY AP VP PERCENT: 26.7 %
MDC IDC STAT BRADY AS VP PERCENT: 0.6 %

## 2014-12-03 NOTE — Progress Notes (Signed)
Battery check only. Remaining longevity 58mo w/ range of <1-3575mo. ROV w/ device clinic 01/07/15 (billable).

## 2014-12-31 ENCOUNTER — Encounter: Payer: Self-pay | Admitting: Internal Medicine

## 2015-01-07 ENCOUNTER — Ambulatory Visit (INDEPENDENT_AMBULATORY_CARE_PROVIDER_SITE_OTHER): Payer: Medicare PPO | Admitting: *Deleted

## 2015-01-07 DIAGNOSIS — I495 Sick sinus syndrome: Secondary | ICD-10-CM

## 2015-01-07 DIAGNOSIS — I4891 Unspecified atrial fibrillation: Secondary | ICD-10-CM

## 2015-01-07 LAB — CUP PACEART INCLINIC DEVICE CHECK
Date Time Interrogation Session: 20160602174019
Lead Channel Pacing Threshold Amplitude: 1 V
Lead Channel Pacing Threshold Amplitude: 1 V
Lead Channel Pacing Threshold Pulse Width: 0.8 ms
Lead Channel Sensing Intrinsic Amplitude: 2.8 mV
Lead Channel Setting Pacing Amplitude: 2 V
Lead Channel Setting Pacing Amplitude: 2.5 V
Lead Channel Setting Sensing Sensitivity: 2.8 mV
MDC IDC MSMT LEADCHNL RV PACING THRESHOLD PULSEWIDTH: 0.4 ms
MDC IDC MSMT LEADCHNL RV SENSING INTR AMPL: 11.2 mV — AB
MDC IDC SET LEADCHNL RV PACING PULSEWIDTH: 0.4 ms

## 2015-01-07 NOTE — Progress Notes (Signed)
Pacemaker check in clinic. Normal device function. Thresholds, sensing, impedances consistent with previous measurements. Device programmed to maximize longevity. 83 mode switches (0.2% + xarelto)- longest 25 mins, pk A >400. 1 VHR episode- 12 beats, pk V 208. Device programmed at appropriate safety margins. Device programmed to optimize intrinsic conduction. Estimated longevity <1-12 months. Device clinic 02/11/15 at 2pm for battery check only. Next billable 04/08/15.

## 2015-01-12 DIAGNOSIS — R0600 Dyspnea, unspecified: Secondary | ICD-10-CM | POA: Diagnosis not present

## 2015-01-12 DIAGNOSIS — L409 Psoriasis, unspecified: Secondary | ICD-10-CM | POA: Diagnosis not present

## 2015-01-12 DIAGNOSIS — M25512 Pain in left shoulder: Secondary | ICD-10-CM | POA: Diagnosis not present

## 2015-01-12 DIAGNOSIS — L4050 Arthropathic psoriasis, unspecified: Secondary | ICD-10-CM | POA: Diagnosis not present

## 2015-01-26 ENCOUNTER — Encounter: Payer: Self-pay | Admitting: Internal Medicine

## 2015-02-11 ENCOUNTER — Ambulatory Visit (INDEPENDENT_AMBULATORY_CARE_PROVIDER_SITE_OTHER): Payer: Medicare PPO | Admitting: *Deleted

## 2015-02-11 DIAGNOSIS — Z45010 Encounter for checking and testing of cardiac pacemaker pulse generator [battery]: Secondary | ICD-10-CM

## 2015-02-11 LAB — CUP PACEART INCLINIC DEVICE CHECK
Date Time Interrogation Session: 20160707142832
Lead Channel Setting Pacing Amplitude: 2 V
Lead Channel Setting Pacing Amplitude: 2.5 V
Lead Channel Setting Pacing Pulse Width: 0.4 ms
Lead Channel Setting Sensing Sensitivity: 2.8 mV

## 2015-02-11 NOTE — Progress Notes (Signed)
Pacemaker check in clinic for battery longevity. <1-9 months remaining with an average of 3 months. ROV with device clinic 03/18/15 at 2pm.

## 2015-03-15 ENCOUNTER — Encounter: Payer: Self-pay | Admitting: Internal Medicine

## 2015-03-18 ENCOUNTER — Ambulatory Visit (INDEPENDENT_AMBULATORY_CARE_PROVIDER_SITE_OTHER): Payer: Medicare PPO | Admitting: *Deleted

## 2015-03-18 DIAGNOSIS — I495 Sick sinus syndrome: Secondary | ICD-10-CM

## 2015-03-18 LAB — CUP PACEART INCLINIC DEVICE CHECK
Battery Impedance: 6958 Ohm
Battery Remaining Longevity: 1 mo — CL
Battery Voltage: 2.67 V
Brady Statistic AS VS Percent: 36.1 %
Date Time Interrogation Session: 20160811162402
Lead Channel Impedance Value: 494 Ohm
Lead Channel Impedance Value: 772 Ohm
Lead Channel Setting Pacing Amplitude: 2.5 V
Lead Channel Setting Sensing Sensitivity: 2.8 mV
MDC IDC SET LEADCHNL RA PACING AMPLITUDE: 2 V
MDC IDC SET LEADCHNL RV PACING PULSEWIDTH: 0.4 ms
MDC IDC STAT BRADY AP VP PERCENT: 42.9 %
MDC IDC STAT BRADY AP VS PERCENT: 20.2 %
MDC IDC STAT BRADY AS VP PERCENT: 0.7 %

## 2015-03-18 NOTE — Progress Notes (Addendum)
Pacemaker check in clinic for batt only (N/C).  49 mode switch, 53 AHR episodes (0.2%)---max dur. 4 mins 30 sec, Max A >400 + Xarelto. No high ventricular rates noted. Pt has ongoing c/o fatigue and ShOB. Patient walked around device room x 2 laps---HR was 105 bpm after exertion---no changes made in sensor. Histogram distribution appropriate for patient activity level. Estimated longevity <1 months (range: <1-7 months). Patient will follow up with the Device Clinic on 9-7 @ 1500.

## 2015-03-22 DIAGNOSIS — L578 Other skin changes due to chronic exposure to nonionizing radiation: Secondary | ICD-10-CM | POA: Diagnosis not present

## 2015-03-22 DIAGNOSIS — L821 Other seborrheic keratosis: Secondary | ICD-10-CM | POA: Diagnosis not present

## 2015-03-22 DIAGNOSIS — L570 Actinic keratosis: Secondary | ICD-10-CM | POA: Diagnosis not present

## 2015-03-22 DIAGNOSIS — C4442 Squamous cell carcinoma of skin of scalp and neck: Secondary | ICD-10-CM | POA: Diagnosis not present

## 2015-04-07 DIAGNOSIS — G603 Idiopathic progressive neuropathy: Secondary | ICD-10-CM | POA: Diagnosis not present

## 2015-04-07 DIAGNOSIS — L4050 Arthropathic psoriasis, unspecified: Secondary | ICD-10-CM | POA: Diagnosis not present

## 2015-04-07 DIAGNOSIS — M150 Primary generalized (osteo)arthritis: Secondary | ICD-10-CM | POA: Diagnosis not present

## 2015-04-07 DIAGNOSIS — R5382 Chronic fatigue, unspecified: Secondary | ICD-10-CM | POA: Diagnosis not present

## 2015-04-09 ENCOUNTER — Encounter: Payer: Self-pay | Admitting: Internal Medicine

## 2015-04-14 ENCOUNTER — Ambulatory Visit (INDEPENDENT_AMBULATORY_CARE_PROVIDER_SITE_OTHER): Payer: Medicare PPO | Admitting: *Deleted

## 2015-04-14 DIAGNOSIS — I495 Sick sinus syndrome: Secondary | ICD-10-CM | POA: Diagnosis not present

## 2015-04-14 LAB — CUP PACEART INCLINIC DEVICE CHECK
Brady Statistic AP VS Percent: 20.1 %
Brady Statistic AS VS Percent: 29.6 %
Lead Channel Impedance Value: 486 Ohm
Lead Channel Impedance Value: 691 Ohm
Lead Channel Pacing Threshold Amplitude: 1.5 V
Lead Channel Pacing Threshold Pulse Width: 0.79 ms
Lead Channel Sensing Intrinsic Amplitude: 11.2 mV
Lead Channel Sensing Intrinsic Amplitude: 2.8 mV
Lead Channel Setting Pacing Amplitude: 2 V
MDC IDC MSMT LEADCHNL RV PACING THRESHOLD AMPLITUDE: 1 V
MDC IDC MSMT LEADCHNL RV PACING THRESHOLD PULSEWIDTH: 0.4 ms
MDC IDC SESS DTM: 20160907161335
MDC IDC SET LEADCHNL RV PACING AMPLITUDE: 2.5 V
MDC IDC SET LEADCHNL RV PACING PULSEWIDTH: 0.4 ms
MDC IDC SET LEADCHNL RV SENSING SENSITIVITY: 2.8 mV
MDC IDC STAT BRADY AP VP PERCENT: 49.1 %
MDC IDC STAT BRADY AS VP PERCENT: 1.2 %

## 2015-04-14 NOTE — Progress Notes (Signed)
Pacemaker check in clinic. Normal device function. Thresholds, sensing, impedances consistent with previous measurements. Device programmed to maximize longevity. 28 mode switches- longest 6:19, pk A >400 +xarelto. No high ventricular rates noted. Device programmed at appropriate safety margins. Histogram distribution appropriate for patient activity level-- pt c/o fatigue with activity-- activity threshold decreased to "low" from "med/low". AV Delays changed r/t Vpacing per GT-- PAVD 310>369ms, SAVD 300>327ms, min PAV 40>33ms. Device programmed to optimize intrinsic conduction. Estimated longevity <1-5 months. Battery check 05/19/15, ROV with GT in December.

## 2015-04-22 DIAGNOSIS — I10 Essential (primary) hypertension: Secondary | ICD-10-CM | POA: Diagnosis not present

## 2015-04-22 DIAGNOSIS — E782 Mixed hyperlipidemia: Secondary | ICD-10-CM | POA: Diagnosis not present

## 2015-04-29 DIAGNOSIS — N400 Enlarged prostate without lower urinary tract symptoms: Secondary | ICD-10-CM | POA: Diagnosis not present

## 2015-04-29 DIAGNOSIS — I10 Essential (primary) hypertension: Secondary | ICD-10-CM | POA: Diagnosis not present

## 2015-04-29 DIAGNOSIS — E785 Hyperlipidemia, unspecified: Secondary | ICD-10-CM | POA: Diagnosis not present

## 2015-04-29 DIAGNOSIS — Z6831 Body mass index (BMI) 31.0-31.9, adult: Secondary | ICD-10-CM | POA: Diagnosis not present

## 2015-05-21 ENCOUNTER — Encounter: Payer: Self-pay | Admitting: Nurse Practitioner

## 2015-05-21 ENCOUNTER — Ambulatory Visit (INDEPENDENT_AMBULATORY_CARE_PROVIDER_SITE_OTHER): Payer: Medicare PPO | Admitting: Nurse Practitioner

## 2015-05-21 ENCOUNTER — Encounter (HOSPITAL_COMMUNITY)
Admission: AD | Disposition: A | Discharge status: Home / Self Care | Payer: Self-pay | Source: Ambulatory Visit | Attending: Internal Medicine

## 2015-05-21 ENCOUNTER — Ambulatory Visit (HOSPITAL_COMMUNITY)
Admission: RE | Admit: 2015-05-21 | Discharge: 2015-05-21 | Disposition: A | Discharge status: Home / Self Care | Payer: Medicare PPO | Source: Ambulatory Visit | Attending: Internal Medicine | Admitting: Internal Medicine

## 2015-05-21 ENCOUNTER — Other Ambulatory Visit: Payer: Self-pay | Admitting: Nurse Practitioner

## 2015-05-21 DIAGNOSIS — M1990 Unspecified osteoarthritis, unspecified site: Secondary | ICD-10-CM | POA: Insufficient documentation

## 2015-05-21 DIAGNOSIS — Z7901 Long term (current) use of anticoagulants: Secondary | ICD-10-CM | POA: Diagnosis not present

## 2015-05-21 DIAGNOSIS — I495 Sick sinus syndrome: Secondary | ICD-10-CM | POA: Diagnosis not present

## 2015-05-21 DIAGNOSIS — I10 Essential (primary) hypertension: Secondary | ICD-10-CM | POA: Diagnosis not present

## 2015-05-21 DIAGNOSIS — Z87891 Personal history of nicotine dependence: Secondary | ICD-10-CM | POA: Diagnosis not present

## 2015-05-21 DIAGNOSIS — E785 Hyperlipidemia, unspecified: Secondary | ICD-10-CM | POA: Diagnosis not present

## 2015-05-21 DIAGNOSIS — Z950 Presence of cardiac pacemaker: Secondary | ICD-10-CM | POA: Diagnosis present

## 2015-05-21 DIAGNOSIS — Z45010 Encounter for checking and testing of cardiac pacemaker pulse generator [battery]: Secondary | ICD-10-CM

## 2015-05-21 DIAGNOSIS — K219 Gastro-esophageal reflux disease without esophagitis: Secondary | ICD-10-CM | POA: Diagnosis not present

## 2015-05-21 DIAGNOSIS — I480 Paroxysmal atrial fibrillation: Secondary | ICD-10-CM

## 2015-05-21 DIAGNOSIS — I4891 Unspecified atrial fibrillation: Secondary | ICD-10-CM | POA: Diagnosis present

## 2015-05-21 HISTORY — PX: EP IMPLANTABLE DEVICE: SHX172B

## 2015-05-21 LAB — CUP PACEART INCLINIC DEVICE CHECK
Brady Statistic RV Percent Paced: 6 %
Date Time Interrogation Session: 20161014151207
Implantable Lead Implant Date: 20031201
Implantable Lead Model: 5076
Lead Channel Setting Pacing Amplitude: 2 V
Lead Channel Setting Pacing Amplitude: 2.5 V
Lead Channel Setting Pacing Pulse Width: 0.4 ms
Lead Channel Setting Sensing Sensitivity: 2.8 mV
MDC IDC LEAD IMPLANT DT: 20031201
MDC IDC LEAD LOCATION: 753859
MDC IDC LEAD LOCATION: 753860
MDC IDC MSMT LEADCHNL RV IMPEDANCE VALUE: 814 Ohm

## 2015-05-21 LAB — BASIC METABOLIC PANEL
Anion gap: 11 (ref 5–15)
BUN: 22 mg/dL — ABNORMAL HIGH (ref 6–20)
CHLORIDE: 103 mmol/L (ref 101–111)
CO2: 22 mmol/L (ref 22–32)
CREATININE: 1.14 mg/dL (ref 0.61–1.24)
Calcium: 9.5 mg/dL (ref 8.9–10.3)
GFR calc non Af Amer: 58 mL/min — ABNORMAL LOW (ref >60)
Glucose, Bld: 104 mg/dL — ABNORMAL HIGH (ref 65–99)
Potassium: 3.9 mmol/L (ref 3.5–5.1)
Sodium: 136 mmol/L (ref 135–145)

## 2015-05-21 LAB — POCT I-STAT, CHEM 8
BUN: 24 mg/dL — AB (ref 6–20)
CALCIUM ION: 1.24 mmol/L (ref 1.13–1.30)
Chloride: 104 mmol/L (ref 101–111)
Creatinine, Ser: 1.2 mg/dL (ref 0.61–1.24)
Glucose, Bld: 100 mg/dL — ABNORMAL HIGH (ref 65–99)
HEMATOCRIT: 43 % (ref 39.0–52.0)
HEMOGLOBIN: 14.6 g/dL (ref 13.0–17.0)
Potassium: 3.9 mmol/L (ref 3.5–5.1)
SODIUM: 141 mmol/L (ref 135–145)
TCO2: 22 mmol/L (ref 0–100)

## 2015-05-21 LAB — CBC
HCT: 43.4 % (ref 39.0–52.0)
Hemoglobin: 14.9 g/dL (ref 13.0–17.0)
MCH: 33.3 pg (ref 26.0–34.0)
MCHC: 34.3 g/dL (ref 30.0–36.0)
MCV: 97.1 fL (ref 78.0–100.0)
PLATELETS: 157 10*3/uL (ref 150–400)
RBC: 4.47 MIL/uL (ref 4.22–5.81)
RDW: 13.5 % (ref 11.5–15.5)
WBC: 6.2 10*3/uL (ref 4.0–10.5)

## 2015-05-21 LAB — SURGICAL PCR SCREEN
MRSA, PCR: NEGATIVE
STAPHYLOCOCCUS AUREUS: NEGATIVE

## 2015-05-21 SURGERY — PPM/BIV PPM GENERATOR CHANGEOUT

## 2015-05-21 MED ORDER — ACETAMINOPHEN 325 MG PO TABS: 325.0000 mg | ORAL_TABLET | ORAL | Status: DC | PRN

## 2015-05-21 MED ORDER — SODIUM CHLORIDE 0.9 % IR SOLN
80.0000 mg | Status: AC
  Administered 2015-05-21: 80 mg
  Filled 2015-05-21: qty 2

## 2015-05-21 MED ORDER — CEFAZOLIN SODIUM-DEXTROSE 2-3 GM-% IV SOLR
2.0000 g | INTRAVENOUS | Status: AC
  Administered 2015-05-21: 2 g via INTRAVENOUS

## 2015-05-21 MED ORDER — LIDOCAINE HCL (PF) 1 % IJ SOLN
INTRAMUSCULAR | Status: DC | PRN
  Administered 2015-05-21: 31 mL via SUBCUTANEOUS

## 2015-05-21 MED ORDER — CEFAZOLIN SODIUM-DEXTROSE 2-3 GM-% IV SOLR
INTRAVENOUS | Status: AC
  Filled 2015-05-21: qty 50

## 2015-05-21 MED ORDER — ONDANSETRON HCL 4 MG/2ML IJ SOLN: 4.0000 mg | Freq: Four times a day (QID) | INTRAMUSCULAR | Status: DC | PRN

## 2015-05-21 MED ORDER — LIDOCAINE HCL (PF) 1 % IJ SOLN
INTRAMUSCULAR | Status: AC
  Filled 2015-05-21: qty 60

## 2015-05-21 MED ORDER — SODIUM CHLORIDE 0.9 % IV SOLN
INTRAVENOUS | Status: DC
Start: 2015-05-21 — End: 2015-05-21
  Administered 2015-05-21: 16:00:00 via INTRAVENOUS

## 2015-05-21 MED ORDER — FENTANYL CITRATE (PF) 100 MCG/2ML IJ SOLN
INTRAMUSCULAR | Status: DC | PRN
  Administered 2015-05-21: 25 ug via INTRAVENOUS

## 2015-05-21 MED ORDER — MUPIROCIN 2 % EX OINT
TOPICAL_OINTMENT | CUTANEOUS | Status: AC
  Administered 2015-05-21: 16:00:00
  Filled 2015-05-21: qty 22

## 2015-05-21 MED ORDER — MIDAZOLAM HCL 5 MG/5ML IJ SOLN
INTRAMUSCULAR | Status: AC
  Filled 2015-05-21: qty 25

## 2015-05-21 MED ORDER — SODIUM CHLORIDE 0.9 % IR SOLN
Status: AC
  Filled 2015-05-21: qty 2

## 2015-05-21 MED ORDER — FENTANYL CITRATE (PF) 100 MCG/2ML IJ SOLN
INTRAMUSCULAR | Status: AC
  Filled 2015-05-21: qty 4

## 2015-05-21 MED ORDER — CHLORHEXIDINE GLUCONATE 4 % EX LIQD
60.0000 mL | CUTANEOUS | Status: DC
  Filled 2015-05-21: qty 60

## 2015-05-21 MED ORDER — MIDAZOLAM HCL 5 MG/5ML IJ SOLN
INTRAMUSCULAR | Status: DC | PRN
  Administered 2015-05-21 (×2): 1 mg via INTRAVENOUS

## 2015-05-21 SURGICAL SUPPLY — 4 items
CABLE SURGICAL S-101-97-12 (CABLE) ×3 IMPLANT
PAD DEFIB LIFELINK (PAD) ×3 IMPLANT
PPM ADVISA MRI DR A2DR01 (Pacemaker) ×3 IMPLANT
TRAY PACEMAKER INSERTION (PACKS) ×3 IMPLANT

## 2015-05-21 NOTE — H&P (Signed)
CC: Pacemaker at ERI  Maurice Hall is a 81 y.o. male seen today for Dr Ladona Ridgelaylor. He presents today after 3 days of feeling poorly. Device interrogation [facility] and [facility] to VVI 65. He has chronic stable DOE but denies chest pain, palpitations, PND, orthopnea, nausea, vomiting, dizziness, syncope, edema, weight gain, or early satiety.  Stress echo 07-2014 demonstrated normal LVEF and no ischemia.   Device History: MDT dual chamber PPM implanted 2003 for sinus node dysfunction   Past Medical History  Diagnosis Date  . HTN (hypertension)   . Symptomatic bradycardia   . Dyslipidemia   . Pacemaker   . Dysrhythmia 2003    sinoatrial node dysfunction  . GERD (gastroesophageal reflux disease)   . Arthritis    Past Surgical History  Procedure Laterality Date  . S/p ppm (medtronic sigma ddd  12/03  . Tonsillectomy      age 89  . Appendectomy  1946  . Vasectomy  1968  . Right knee cartilage removed  [id number]  . Eye surgery  02/01/2007,09/02/2007    left eye, right eye  . Vocal cord growth   02/1999    removed growth on vocal cords  . Insert / replace / remove pacemaker  07/07/2002  . Right shoulder  08/26/1990    arthroscopy  . Total knee arthroplasty  12/11/2011    Procedure: TOTAL KNEE ARTHROPLASTY; Surgeon: Loanne DrillingFrank V Aluisio, MD; Location: [facility]; Service: Orthopedics; Laterality: Right;  . Steriod injection  12/11/2011    Procedure: STEROID INJECTION; Surgeon: Loanne DrillingFrank V Aluisio, MD; Location: [facility]; Service: Orthopedics; Laterality: Left;    Current Outpatient Prescriptions  Medication Sig Dispense Refill  . Adalimumab 40 MG/0.8ML PSKT Inject 40 mg into the skin every 14 (fourteen) days.    . Dietary Management Product (RHEUMATE) CAPS Take 1 capsule by mouth daily.    . fish oil-omega-3 fatty acids 1000 MG capsule Take 3 g  by mouth daily.     . folic acid (FOLVITE) 1 MG tablet Take 1 tablet by mouth daily.    . furosemide (LASIX) 20 MG tablet Take 1 tablet (20 mg total) by mouth daily. 90 tablet 3  . Glucosamine-Chondroitin (GLUCOSAMINE CHONDR COMPLEX PO) Take 1,500 mg by mouth daily.     Marland Kitchen. losartan-hydrochlorothiazide (HYZAAR) 100-25 MG per tablet Take 1 tablet by mouth daily.    . meloxicam (MOBIC) 7.5 MG tablet Take 7.5 mg by mouth daily with breakfast.     . methotrexate (RHEUMATREX) 2.5 MG tablet Take 8 tablets by mouth once a week.    Marland Kitchen. oxybutynin (DITROPAN) 5 MG tablet Take 2 tablets by mouth daily.    . simvastatin (ZOCOR) 20 MG tablet Take 20 mg by mouth at bedtime.     . tamsulosin (FLOMAX) 0.4 MG CAPS capsule Take 1 capsule by mouth daily.    Carlena Hurl. XARELTO 20 MG TABS tablet TAKE ONE TABLET BY MOUTH ONCE DAILY 30 tablet 11   No current facility-administered medications for this visit.    Allergies: Review of patient's allergies indicates no known allergies.   Social History: Social History   Social History  . Marital Status: Married    Spouse Name: N/A  . Number of Children: N/A  . Years of Education: N/A   Occupational History  . Not on file.   Social History Main Topics  . Smoking status: Former Smoker -- 3.00 packs/day for 15 years    Types: Cigarettes    Quit  date: 12/04/1963  . Smokeless tobacco: Not on file  . Alcohol Use: No  . Drug Use: No     Comment: Rare ETOH   . Sexual Activity: Not on file   Other Topics Concern  . Not on file   Social History Narrative    Family History: Family History  Problem Relation Age of Onset  . Coronary artery disease Neg Hx      Review of Systems: All other systems reviewed and are otherwise negative except as noted above.   Physical Exam: GEN- The patient is elderly appearing, alert and oriented x 3 today.   HEENT: normocephalic, atraumatic; sclera clear, conjunctiva pink; hearing intact; oropharynx clear; neck supple  Lungs- Clear to ausculation bilaterally, normal work of breathing. No wheezes, rales, rhonchi Heart- Regular rate and rhythm (paced) GI- soft, non-tender, non-distended, bowel sounds present  Extremities- no clubbing, cyanosis, or edema; DP/PT/radial pulses 1+ bilaterally MS- no significant deformity or atrophy Skin- warm and dry, no rash or lesion; PPM pocket well healed Psych- euthymic mood, full affect Neuro- strength and sensation are intact  PPM Interrogation- reviewed in detail today, See PACEART report  EKG: EKG is not ordered today.  Recent Labs: No results found for requested labs within last 365 days.   Wt Readings from Last 3 Encounters:  07/14/14 233 lb 12.8 oz (106.051 kg)  07/08/13 234 lb (106.142 kg)  03/11/13 230 lb (104.327 kg)     Other studies Reviewed: Additional studies/ records that were reviewed today include: Dr Lubertha Basque office notes, recent stress echo  Assessment and Plan:  1. Symptomatic bradycardia PPM at ERI and reverted to VVI 65 Pt is symptomatic Risks, benefits to gen change reviewed with the patient and his wife who wish to proceed. Will plan to do with Dr Ladona Ridgel this afternoon.   2. Paroxysmal atrial fibrillation Continue Xarelto for CHADS2VASC of at least 3 May need to hold over the weekend with new pocket  3. HTN Stable No change required today    Current medicines are reviewed at length with the patient today.  The patient does not have concerns regarding his medicines. The following changes were made today: none  Labs/ tests ordered today include: none    Disposition: Follow up with Dr Ladona Ridgel following gen change       EP Attending  Patient seen and examined. Agree with above. He has reached ERI. He has profound sinus node dysfunction and present for DDD PM generator change out. I have  discussed the indications, risks/benefits/goals/expectations and he wishes to proceed.  Leonia Reeves.D.

## 2015-05-21 NOTE — Progress Notes (Signed)
Electrophysiology Office Note Date: 05/21/2015  ID:  ANTRONE WALLA, DOB 03/11/1931, MRN [id number]  PCP: Charlott Rakes, MD Electrophysiologist: Ladona Ridgel  CC: Pacemaker at ERI  Maurice Hall is a 81 y.o. male seen today for Dr Ladona Ridgel.  He presents today after 3 days of feeling poorly. Device interrogation demonstrates that his device is at [facility] and has reverted to VVI 65.   He has chronic stable DOE but denies chest pain, palpitations, PND, orthopnea, nausea, vomiting, dizziness, syncope, edema, weight gain, or early satiety.  Stress echo 07-2014 demonstrated normal LVEF and no ischemia.   Device History: MDT dual chamber PPM implanted 2003 for sinus node dysfunction   Past Medical History  Diagnosis Date  . HTN (hypertension)   . Symptomatic bradycardia   . Dyslipidemia   . Pacemaker   . Dysrhythmia 2003    sinoatrial node dysfunction  . GERD (gastroesophageal reflux disease)   . Arthritis    Past Surgical History  Procedure Laterality Date  . S/p ppm (medtronic sigma ddd  12/03  . Tonsillectomy      age 46  . Appendectomy  1946  . Vasectomy  1968  . Right knee cartilage removed  [id number]  . Eye surgery  02/01/2007,09/02/2007    left eye, right eye  . Vocal cord growth   02/1999    removed growth on vocal cords  . Insert / replace / remove pacemaker  07/07/2002  . Right shoulder  08/26/1990    arthroscopy  . Total knee arthroplasty  12/11/2011    Procedure: TOTAL KNEE ARTHROPLASTY;  Surgeon: Loanne Drilling, MD;  Location: [facility];  Service: Orthopedics;  Laterality: Right;  . Steriod injection  12/11/2011    Procedure: STEROID INJECTION;  Surgeon: Loanne Drilling, MD;  Location: [facility];  Service: Orthopedics;  Laterality: Left;    Current Outpatient Prescriptions  Medication Sig Dispense Refill  . Adalimumab 40 MG/0.8ML PSKT Inject 40 mg into the skin every 14 (fourteen) days.    . Dietary Management Product (RHEUMATE) CAPS Take 1 capsule by mouth daily.    . fish  oil-omega-3 fatty acids 1000 MG capsule Take 3 g by mouth daily.     . folic acid (FOLVITE) 1 MG tablet Take 1 tablet by mouth daily.    . furosemide (LASIX) 20 MG tablet Take 1 tablet (20 mg total) by mouth daily. 90 tablet 3  . Glucosamine-Chondroitin (GLUCOSAMINE CHONDR COMPLEX PO) Take 1,500 mg by mouth daily.     Marland Kitchen losartan-hydrochlorothiazide (HYZAAR) 100-25 MG per tablet Take 1 tablet by mouth daily.    . meloxicam (MOBIC) 7.5 MG tablet Take 7.5 mg by mouth daily with breakfast.     . methotrexate (RHEUMATREX) 2.5 MG tablet Take 8 tablets by mouth once a week.    Marland Kitchen oxybutynin (DITROPAN) 5 MG tablet Take 2 tablets by mouth daily.    . simvastatin (ZOCOR) 20 MG tablet Take 20 mg by mouth at bedtime.     . tamsulosin (FLOMAX) 0.4 MG CAPS capsule Take 1 capsule by mouth daily.    Carlena Hurl 20 MG TABS tablet TAKE ONE TABLET BY MOUTH ONCE DAILY 30 tablet 11   No current facility-administered medications for this visit.    Allergies:   Review of patient's allergies indicates no known allergies.   Social History: Social History   Social History  . Marital Status: Married    Spouse Name: N/A  . Number of Children: N/A  . Years of Education:  N/A   Occupational History  . Not on file.   Social History Main Topics  . Smoking status: Former Smoker -- 3.00 packs/day for 15 years    Types: Cigarettes    Quit date: 12/04/1963  . Smokeless tobacco: Not on file  . Alcohol Use: No  . Drug Use: No     Comment: Rare ETOH   . Sexual Activity: Not on file   Other Topics Concern  . Not on file   Social History Narrative    Family History: Family History  Problem Relation Age of Onset  . Coronary artery disease Neg Hx      Review of Systems: All other systems reviewed and are otherwise negative except as noted above.   Physical Exam: GEN- The patient is elderly appearing, alert and oriented x 3 today.   HEENT: normocephalic, atraumatic; sclera clear, conjunctiva pink; hearing  intact; oropharynx clear; neck supple  Lungs- Clear to ausculation bilaterally, normal work of breathing.  No wheezes, rales, rhonchi Heart- Regular rate and rhythm (paced) GI- soft, non-tender, non-distended, bowel sounds present  Extremities- no clubbing, cyanosis, or edema; DP/PT/radial pulses 1+ bilaterally MS- no significant deformity or atrophy Skin- warm and dry, no rash or lesion; PPM pocket well healed Psych- euthymic mood, full affect Neuro- strength and sensation are intact  PPM Interrogation- reviewed in detail today,  See PACEART report  EKG:  EKG is not ordered today.  Recent Labs: No results found for requested labs within last 365 days.   Wt Readings from Last 3 Encounters:  07/14/14 233 lb 12.8 oz (106.051 kg)  07/08/13 234 lb (106.142 kg)  03/11/13 230 lb (104.327 kg)     Other studies Reviewed: Additional studies/ records that were reviewed today include: Dr Lubertha Basqueaylor's office notes, recent stress echo  Assessment and Plan:  1.  Symptomatic bradycardia PPM at ERI and reverted to VVI 65 Pt is symptomatic Risks, benefits to gen change reviewed with the patient and his wife who wish to proceed. Will plan to do with Dr Ladona Ridgelaylor this afternoon.    2.  Paroxysmal atrial fibrillation Continue Xarelto for CHADS2VASC of at least 3 May need to hold over the weekend with new pocket  3.  HTN Stable No change required today    Current medicines are reviewed at length with the patient today.   The patient does not have concerns regarding his medicines.  The following changes were made today:  none  Labs/ tests ordered today include: none    Disposition:   Follow up with Dr Ladona Ridgelaylor following gen change     Signed, Gypsy BalsamAmber Hazleigh Mccleave, NP 05/21/2015 3:06 PM  [facility] [address] [place] [address] [phone] (office) [phone] (fax)

## 2015-05-21 NOTE — Discharge Instructions (Signed)

## 2015-05-21 NOTE — Progress Notes (Signed)
Pacemaker check in clinic for battery estimate only (N/C). No ventricular high rate episodes recorded. ERI reached on 10/13---reverted to VVI 65---pt c/o feeling "woozy" x 3 days, single chamber hysteresis enabled at 50bpm. Patient will have gen change later today.

## 2015-05-24 ENCOUNTER — Encounter: Payer: Self-pay | Admitting: Internal Medicine

## 2015-05-24 ENCOUNTER — Encounter (HOSPITAL_COMMUNITY): Payer: Self-pay | Admitting: Internal Medicine

## 2015-05-27 DIAGNOSIS — Z23 Encounter for immunization: Secondary | ICD-10-CM | POA: Diagnosis not present

## 2015-05-31 ENCOUNTER — Ambulatory Visit (INDEPENDENT_AMBULATORY_CARE_PROVIDER_SITE_OTHER): Payer: Medicare PPO | Admitting: *Deleted

## 2015-05-31 DIAGNOSIS — I495 Sick sinus syndrome: Secondary | ICD-10-CM | POA: Diagnosis not present

## 2015-05-31 DIAGNOSIS — Z950 Presence of cardiac pacemaker: Secondary | ICD-10-CM

## 2015-05-31 LAB — CUP PACEART INCLINIC DEVICE CHECK
Brady Statistic AP VP Percent: 0.08 %
Brady Statistic AP VS Percent: 83.05 %
Brady Statistic AS VP Percent: 0.02 %
Brady Statistic RA Percent Paced: 83.13 %
Brady Statistic RV Percent Paced: 0.1 %
Implantable Lead Implant Date: 20031201
Implantable Lead Location: 753860
Implantable Lead Model: 5076
Lead Channel Impedance Value: 304 Ohm
Lead Channel Impedance Value: 532 Ohm
Lead Channel Pacing Threshold Amplitude: 0.875 V
Lead Channel Pacing Threshold Pulse Width: 0.4 ms
Lead Channel Pacing Threshold Pulse Width: 0.4 ms
Lead Channel Sensing Intrinsic Amplitude: 11.875 mV
Lead Channel Sensing Intrinsic Amplitude: 2.75 mV
Lead Channel Setting Pacing Pulse Width: 0.4 ms
Lead Channel Setting Sensing Sensitivity: 2.8 mV
MDC IDC LEAD IMPLANT DT: 20031201
MDC IDC LEAD LOCATION: 753859
MDC IDC MSMT BATTERY VOLTAGE: 3.11 V
MDC IDC MSMT LEADCHNL RA IMPEDANCE VALUE: 361 Ohm
MDC IDC MSMT LEADCHNL RA SENSING INTR AMPL: 0.75 mV
MDC IDC MSMT LEADCHNL RV IMPEDANCE VALUE: 456 Ohm
MDC IDC MSMT LEADCHNL RV PACING THRESHOLD AMPLITUDE: 0.75 V
MDC IDC MSMT LEADCHNL RV SENSING INTR AMPL: 10.875 mV
MDC IDC SESS DTM: 20161024162301
MDC IDC SET LEADCHNL RA PACING AMPLITUDE: 2 V
MDC IDC SET LEADCHNL RV PACING AMPLITUDE: 2.5 V
MDC IDC STAT BRADY AS VS PERCENT: 16.85 %

## 2015-05-31 NOTE — Progress Notes (Signed)
Changeout wound check appointment. Steri-strips removed. Wound without redness or edema. Incision edges approximated, wound well healed. Normal device function. Thresholds, sensing, and impedances consistent with implant measurements. Device programmed at chronic output settings---changed RA output from 1.75 to 2.0V. Histogram distribution appropriate for patient and level of activity. No mode switches or high ventricular rates noted. Patient educated about wound care, arm mobility, lifting restrictions. ROV w/ GT 08/24/15.

## 2015-06-09 ENCOUNTER — Encounter: Payer: Self-pay | Admitting: Internal Medicine

## 2015-06-16 DIAGNOSIS — H02002 Unspecified entropion of right lower eyelid: Secondary | ICD-10-CM | POA: Diagnosis not present

## 2015-06-21 DIAGNOSIS — H02032 Senile entropion of right lower eyelid: Secondary | ICD-10-CM | POA: Diagnosis not present

## 2015-06-21 DIAGNOSIS — H35371 Puckering of macula, right eye: Secondary | ICD-10-CM | POA: Diagnosis not present

## 2015-06-21 DIAGNOSIS — Z961 Presence of intraocular lens: Secondary | ICD-10-CM | POA: Diagnosis not present

## 2015-06-24 DIAGNOSIS — H02032 Senile entropion of right lower eyelid: Secondary | ICD-10-CM | POA: Diagnosis not present

## 2015-07-02 DIAGNOSIS — N201 Calculus of ureter: Secondary | ICD-10-CM | POA: Diagnosis not present

## 2015-07-02 DIAGNOSIS — R1032 Left lower quadrant pain: Secondary | ICD-10-CM | POA: Diagnosis not present

## 2015-07-07 DIAGNOSIS — M150 Primary generalized (osteo)arthritis: Secondary | ICD-10-CM | POA: Diagnosis not present

## 2015-07-07 DIAGNOSIS — L4050 Arthropathic psoriasis, unspecified: Secondary | ICD-10-CM | POA: Diagnosis not present

## 2015-07-07 DIAGNOSIS — G603 Idiopathic progressive neuropathy: Secondary | ICD-10-CM | POA: Diagnosis not present

## 2015-07-07 DIAGNOSIS — R5382 Chronic fatigue, unspecified: Secondary | ICD-10-CM | POA: Diagnosis not present

## 2015-07-07 DIAGNOSIS — Z79899 Other long term (current) drug therapy: Secondary | ICD-10-CM | POA: Diagnosis not present

## 2015-07-14 DIAGNOSIS — H353131 Nonexudative age-related macular degeneration, bilateral, early dry stage: Secondary | ICD-10-CM | POA: Diagnosis not present

## 2015-07-14 DIAGNOSIS — H35371 Puckering of macula, right eye: Secondary | ICD-10-CM | POA: Diagnosis not present

## 2015-07-14 DIAGNOSIS — Z961 Presence of intraocular lens: Secondary | ICD-10-CM | POA: Diagnosis not present

## 2015-07-16 DIAGNOSIS — H02032 Senile entropion of right lower eyelid: Secondary | ICD-10-CM | POA: Diagnosis not present

## 2015-08-17 ENCOUNTER — Encounter: Payer: Self-pay | Admitting: Internal Medicine

## 2015-08-19 ENCOUNTER — Other Ambulatory Visit: Payer: Self-pay | Admitting: Internal Medicine

## 2015-08-24 ENCOUNTER — Encounter: Payer: Self-pay | Admitting: Internal Medicine

## 2015-08-24 ENCOUNTER — Ambulatory Visit (INDEPENDENT_AMBULATORY_CARE_PROVIDER_SITE_OTHER): Payer: Medicare Other | Admitting: Internal Medicine

## 2015-08-24 VITALS — BP 120/76 | HR 75 | Ht 74.0 in | Wt 235.2 lb

## 2015-08-24 DIAGNOSIS — I10 Essential (primary) hypertension: Secondary | ICD-10-CM | POA: Diagnosis not present

## 2015-08-24 DIAGNOSIS — Z950 Presence of cardiac pacemaker: Secondary | ICD-10-CM

## 2015-08-24 DIAGNOSIS — I495 Sick sinus syndrome: Secondary | ICD-10-CM

## 2015-08-24 DIAGNOSIS — I480 Paroxysmal atrial fibrillation: Secondary | ICD-10-CM

## 2015-08-24 LAB — CUP PACEART INCLINIC DEVICE CHECK
Battery Remaining Longevity: 122 mo
Battery Voltage: 3.05 V
Brady Statistic AP VP Percent: 0.37 %
Brady Statistic RV Percent Paced: 0.43 %
Implantable Lead Implant Date: 20031201
Implantable Lead Location: 753860
Implantable Lead Model: 5076
Lead Channel Impedance Value: 285 Ohm
Lead Channel Impedance Value: 513 Ohm
Lead Channel Pacing Threshold Pulse Width: 0.4 ms
Lead Channel Sensing Intrinsic Amplitude: 2.5 mV
Lead Channel Setting Pacing Amplitude: 2.5 V
MDC IDC LEAD IMPLANT DT: 20031201
MDC IDC LEAD LOCATION: 753859
MDC IDC MSMT LEADCHNL RA IMPEDANCE VALUE: 361 Ohm
MDC IDC MSMT LEADCHNL RA PACING THRESHOLD AMPLITUDE: 1 V
MDC IDC MSMT LEADCHNL RV IMPEDANCE VALUE: 456 Ohm
MDC IDC MSMT LEADCHNL RV PACING THRESHOLD AMPLITUDE: 0.75 V
MDC IDC MSMT LEADCHNL RV PACING THRESHOLD PULSEWIDTH: 0.4 ms
MDC IDC MSMT LEADCHNL RV SENSING INTR AMPL: 12.375 mV
MDC IDC SESS DTM: 20170117151901
MDC IDC SET LEADCHNL RA PACING AMPLITUDE: 2.25 V
MDC IDC SET LEADCHNL RV PACING PULSEWIDTH: 0.4 ms
MDC IDC SET LEADCHNL RV SENSING SENSITIVITY: 2.8 mV
MDC IDC STAT BRADY AP VS PERCENT: 88.34 %
MDC IDC STAT BRADY AS VP PERCENT: 0.06 %
MDC IDC STAT BRADY AS VS PERCENT: 11.23 %
MDC IDC STAT BRADY RA PERCENT PACED: 88.71 %

## 2015-08-24 NOTE — Assessment & Plan Note (Signed)
His new medtronic DDD PM is working normally. Will recheck in several months.

## 2015-08-24 NOTE — Assessment & Plan Note (Signed)
HIs blood pressure is well controlled. Will follow.  

## 2015-08-24 NOTE — Progress Notes (Signed)
HPI [name] returns today for followup. He is a 82 yo man with a h/o symptomatic bradycardia, HTN, and arthritis, s/p PPM.  He has also developed painful joints and was found to have psoriatic arthritis. He was initially placed on prednisone, and then switched to methotrexate. His joint discomfort is improved, but still present. He has undergone PM generator insertion after his device went ERI.  No Known Allergies   Current Outpatient Prescriptions  Medication Sig Dispense Refill  . fish oil-omega-3 fatty acids 1000 MG capsule Take 3 g by mouth daily.     . folic acid (FOLVITE) 1 MG tablet Take 1 tablet by mouth daily.    . furosemide (LASIX) 20 MG tablet Take 1 tablet (20 mg total) by mouth daily. 90 tablet 3  . Glucosamine-Chondroitin (GLUCOSAMINE CHONDR COMPLEX PO) Take 1,500 mg by mouth daily.     Marland Kitchen losartan-hydrochlorothiazide (HYZAAR) 100-25 MG per tablet Take 1 tablet by mouth daily.    . meloxicam (MOBIC) 7.5 MG tablet Take 7.5 mg by mouth daily with breakfast.     . methotrexate (RHEUMATREX) 2.5 MG tablet Take 8 tablets by mouth once a week.    Marland Kitchen oxybutynin (DITROPAN) 5 MG tablet Take 2 tablets by mouth daily.    . simvastatin (ZOCOR) 20 MG tablet Take 20 mg by mouth at bedtime.     . tamsulosin (FLOMAX) 0.4 MG CAPS capsule Take 1 capsule by mouth daily.    Carlena Hurl 20 MG TABS tablet TAKE ONE TABLET BY MOUTH ONCE DAILY 30 tablet 9   No current facility-administered medications for this visit.     Past Medical History  Diagnosis Date  . HTN (hypertension)   . Symptomatic bradycardia     a. s/p MDT dual chamber PPM  . Dyslipidemia   . GERD (gastroesophageal reflux disease)   . Arthritis   . Paroxysmal atrial fibrillation (HCC)     ROS:   All systems reviewed and negative except as noted in the HPI.   Past Surgical History  Procedure Laterality Date  . S/p ppm (medtronic sigma ddd  12/03  . Tonsillectomy      age 40  . Appendectomy  1946  . Vasectomy  1968  .  Right knee cartilage removed  [id number]  . Eye surgery  02/01/2007,09/02/2007    left eye, right eye  . Vocal cord growth   02/1999    removed growth on vocal cords  . Right shoulder  08/26/1990    arthroscopy  . Total knee arthroplasty  12/11/2011    Procedure: TOTAL KNEE ARTHROPLASTY;  Surgeon: Loanne Drilling, MD;  Location: [facility];  Service: Orthopedics;  Laterality: Right;  . Steriod injection  12/11/2011    Procedure: STEROID INJECTION;  Surgeon: Loanne Drilling, MD;  Location: [facility];  Service: Orthopedics;  Laterality: Left;  . Ep implantable device N/A 05/21/2015    Procedure:  PPM Generator Changeout;  Surgeon: Marinus Maw, MD;  Location: [facility] INVASIVE CV LAB;  Service: Cardiovascular;  Laterality: N/A;     Family History  Problem Relation Age of Onset  . Coronary artery disease Neg Hx      Social History   Social History  . Marital Status: Married    Spouse Name: N/A  . Number of Children: N/A  . Years of Education: N/A   Occupational History  . Not on file.   Social History Main Topics  . Smoking status: Former Smoker -- 3.00 packs/day for 15 years  Types: Cigarettes    Quit date: 12/04/1963  . Smokeless tobacco: Not on file  . Alcohol Use: No  . Drug Use: No     Comment: Rare ETOH   . Sexual Activity: Not on file   Other Topics Concern  . Not on file   Social History Narrative     BP 120/76 mmHg  Pulse 75  Ht  (1.88 m)  Wt 235 lb 3.2 oz (106.686 kg)  BMI 30.19 kg/m2  Physical Exam:  Well appearing 82 year old man, NAD HEENT: Unremarkable Neck:  6 cm JVD, no thyromegally Lungs:  Clear with no wheezes, rales, or rhonchi. HEART:  Regular rate rhythm, no murmurs, no rubs, no clicks Abd:  soft, positive bowel sounds, no organomegally, no rebound, no guarding Ext:  2 plus pulses, no edema, no cyanosis, no clubbing Skin:  No rashes no nodules Neuro:  CN II through XII intact, motor grossly intact  DEVICE  Normal device function.  See  PaceArt for details.   Assess/Plan:

## 2015-08-24 NOTE — Assessment & Plan Note (Signed)
He is maintaining NSR 99% of the time.

## 2015-08-24 NOTE — Patient Instructions (Signed)
Medication Instructions:  Your physician recommends that you continue on your current medications as directed. Please refer to the Current Medication list given to you today.   Labwork: None ordered   Testing/Procedures: None ordered   Follow-Up:  Your physician wants you to follow-up in: 12 months with Dr Court Joy will receive a reminder letter in the mail two months in advance. If you don't receive a letter, please call our office to schedule the follow-up appointment.  Remote monitoring is used to monitor your Pacemaker of ICD from home. This monitoring reduces the number of office visits required to check your device to one time per year. It allows [place] to keep an eye on the functioning of your device to ensure it is working properly. You are scheduled for a device check from home on 11/23/15. You may send your transmission at any time that day. If you have a wireless device, the transmission will be sent automatically. After your physician reviews your transmission, you will receive a postcard with your next transmission date.     Any Other Special Instructions Will Be Listed Below (If Applicable).     If you need a refill on your cardiac medications before your next appointment, please call your pharmacy.

## 2015-08-30 ENCOUNTER — Telehealth: Payer: Self-pay | Admitting: Internal Medicine

## 2015-08-30 ENCOUNTER — Other Ambulatory Visit: Payer: Self-pay | Admitting: Internal Medicine

## 2015-08-30 NOTE — Telephone Encounter (Signed)
Pt is calling stating that his insurance is requiring a prior auth for the medication Xarelto 20 mg tablet. I informed the pt that I would be sending a message to Odette Horns, who handles the prior auth. I advised the pt that if has any other problems, questions or concerns to call the office pt verbalized understanding. Please advise

## 2015-09-02 ENCOUNTER — Telehealth: Payer: Self-pay

## 2015-09-02 NOTE — Telephone Encounter (Signed)
PA for Xarelto sent to [facility]. Will notify patient when it is approved.

## 2015-09-02 NOTE — Telephone Encounter (Signed)
Prior auth for Xarelto 20mg sent to [facility]. 

## 2015-09-03 NOTE — Telephone Encounter (Signed)
Xarelto approved through 08/06/2016. PA [id number].patient informed of this.

## 2015-11-23 ENCOUNTER — Ambulatory Visit (INDEPENDENT_AMBULATORY_CARE_PROVIDER_SITE_OTHER): Payer: Medicare Other | Admitting: *Deleted

## 2015-11-23 ENCOUNTER — Telehealth: Payer: Self-pay | Admitting: Cardiology

## 2015-11-23 DIAGNOSIS — I495 Sick sinus syndrome: Secondary | ICD-10-CM | POA: Diagnosis not present

## 2015-11-23 DIAGNOSIS — Z950 Presence of cardiac pacemaker: Secondary | ICD-10-CM | POA: Diagnosis not present

## 2015-11-23 NOTE — Progress Notes (Signed)
Remote pacemaker transmission.   

## 2015-11-23 NOTE — Telephone Encounter (Signed)
Spoke with pt and reminded pt of remote transmission that is due today. Pt verbalized understanding.   

## 2015-12-30 ENCOUNTER — Encounter: Payer: Self-pay | Admitting: Cardiology

## 2015-12-30 LAB — CUP PACEART REMOTE DEVICE CHECK
Brady Statistic AP VP Percent: 0.24 %
Brady Statistic AS VP Percent: 0.14 %
Brady Statistic RA Percent Paced: 90.86 %
Brady Statistic RV Percent Paced: 0.38 %
Implantable Lead Implant Date: 20031201
Implantable Lead Location: 753859
Implantable Lead Location: 753860
Implantable Lead Model: 5076
Implantable Lead Model: 5076
Lead Channel Impedance Value: 304 Ohm
Lead Channel Impedance Value: 437 Ohm
Lead Channel Impedance Value: 494 Ohm
Lead Channel Pacing Threshold Pulse Width: 0.4 ms
Lead Channel Sensing Intrinsic Amplitude: 10.25 mV
Lead Channel Setting Pacing Pulse Width: 0.4 ms
Lead Channel Setting Sensing Sensitivity: 2.8 mV
MDC IDC LEAD IMPLANT DT: 20031201
MDC IDC MSMT BATTERY REMAINING LONGEVITY: 110 mo
MDC IDC MSMT BATTERY VOLTAGE: 3.03 V
MDC IDC MSMT LEADCHNL RA IMPEDANCE VALUE: 361 Ohm
MDC IDC MSMT LEADCHNL RA PACING THRESHOLD AMPLITUDE: 0.875 V
MDC IDC MSMT LEADCHNL RA SENSING INTR AMPL: 1.125 mV
MDC IDC MSMT LEADCHNL RA SENSING INTR AMPL: 1.125 mV
MDC IDC MSMT LEADCHNL RV PACING THRESHOLD AMPLITUDE: 0.625 V
MDC IDC MSMT LEADCHNL RV PACING THRESHOLD PULSEWIDTH: 0.4 ms
MDC IDC MSMT LEADCHNL RV SENSING INTR AMPL: 10.25 mV
MDC IDC SESS DTM: 20170418190627
MDC IDC SET LEADCHNL RA PACING AMPLITUDE: 1.75 V
MDC IDC SET LEADCHNL RV PACING AMPLITUDE: 2.5 V
MDC IDC STAT BRADY AP VS PERCENT: 90.63 %
MDC IDC STAT BRADY AS VS PERCENT: 9 %

## 2016-02-22 ENCOUNTER — Ambulatory Visit (INDEPENDENT_AMBULATORY_CARE_PROVIDER_SITE_OTHER): Payer: Medicare Other | Admitting: *Deleted

## 2016-02-22 DIAGNOSIS — I495 Sick sinus syndrome: Secondary | ICD-10-CM

## 2016-02-22 NOTE — Progress Notes (Signed)
Remote pacemaker transmission.   

## 2016-02-24 ENCOUNTER — Encounter: Payer: Self-pay | Admitting: Cardiology

## 2016-03-06 LAB — CUP PACEART REMOTE DEVICE CHECK
Brady Statistic AP VP Percent: 0.41 %
Brady Statistic AP VS Percent: 93.79 %
Brady Statistic AS VP Percent: 0.02 %
Brady Statistic RA Percent Paced: 94.2 %
Date Time Interrogation Session: 20170718132403
Implantable Lead Implant Date: 20031201
Implantable Lead Location: 753860
Implantable Lead Model: 5076
Lead Channel Impedance Value: 437 Ohm
Lead Channel Pacing Threshold Pulse Width: 0.4 ms
Lead Channel Sensing Intrinsic Amplitude: 0.875 mV
Lead Channel Sensing Intrinsic Amplitude: 10.625 mV
Lead Channel Sensing Intrinsic Amplitude: 10.625 mV
Lead Channel Setting Pacing Amplitude: 1.75 V
Lead Channel Setting Pacing Amplitude: 2.5 V
Lead Channel Setting Sensing Sensitivity: 2.8 mV
MDC IDC LEAD IMPLANT DT: 20031201
MDC IDC LEAD LOCATION: 753859
MDC IDC MSMT BATTERY REMAINING LONGEVITY: 106 mo
MDC IDC MSMT BATTERY VOLTAGE: 3.02 V
MDC IDC MSMT LEADCHNL RA IMPEDANCE VALUE: 304 Ohm
MDC IDC MSMT LEADCHNL RA IMPEDANCE VALUE: 361 Ohm
MDC IDC MSMT LEADCHNL RA SENSING INTR AMPL: 0.875 mV
MDC IDC MSMT LEADCHNL RV IMPEDANCE VALUE: 494 Ohm
MDC IDC MSMT LEADCHNL RV PACING THRESHOLD AMPLITUDE: 0.625 V
MDC IDC SET LEADCHNL RV PACING PULSEWIDTH: 0.4 ms
MDC IDC STAT BRADY AS VS PERCENT: 5.78 %
MDC IDC STAT BRADY RV PERCENT PACED: 0.43 %

## 2016-05-23 ENCOUNTER — Encounter: Payer: Medicare Other | Admitting: Internal Medicine

## 2016-06-08 ENCOUNTER — Encounter: Payer: Self-pay | Admitting: *Deleted

## 2016-06-13 ENCOUNTER — Other Ambulatory Visit: Payer: Self-pay | Admitting: Internal Medicine

## 2016-06-23 ENCOUNTER — Encounter: Payer: Self-pay | Admitting: Internal Medicine

## 2016-06-23 ENCOUNTER — Ambulatory Visit (INDEPENDENT_AMBULATORY_CARE_PROVIDER_SITE_OTHER): Payer: Medicare Other | Admitting: Internal Medicine

## 2016-06-23 VITALS — BP 130/60 | HR 87 | Ht 74.0 in | Wt 232.0 lb

## 2016-06-23 DIAGNOSIS — I495 Sick sinus syndrome: Secondary | ICD-10-CM

## 2016-06-23 DIAGNOSIS — Z950 Presence of cardiac pacemaker: Secondary | ICD-10-CM

## 2016-06-23 LAB — CUP PACEART INCLINIC DEVICE CHECK
Battery Remaining Longevity: 97 mo
Battery Voltage: 3.01 V
Brady Statistic RA Percent Paced: 92.26 %
Date Time Interrogation Session: 20171117165224
Implantable Lead Model: 5076
Implantable Lead Model: 5076
Lead Channel Impedance Value: 304 Ohm
Lead Channel Impedance Value: 361 Ohm
Lead Channel Pacing Threshold Amplitude: 1 V
Lead Channel Pacing Threshold Pulse Width: 0.4 ms
Lead Channel Sensing Intrinsic Amplitude: 2.25 mV
Lead Channel Setting Sensing Sensitivity: 2.8 mV
MDC IDC LEAD IMPLANT DT: 20031201
MDC IDC LEAD IMPLANT DT: 20031201
MDC IDC LEAD LOCATION: 753859
MDC IDC LEAD LOCATION: 753860
MDC IDC MSMT LEADCHNL RV IMPEDANCE VALUE: 437 Ohm
MDC IDC MSMT LEADCHNL RV IMPEDANCE VALUE: 494 Ohm
MDC IDC MSMT LEADCHNL RV PACING THRESHOLD AMPLITUDE: 0.75 V
MDC IDC MSMT LEADCHNL RV PACING THRESHOLD PULSEWIDTH: 0.4 ms
MDC IDC MSMT LEADCHNL RV SENSING INTR AMPL: 11.875 mV
MDC IDC PG IMPLANT DT: 20161014
MDC IDC SET LEADCHNL RA PACING AMPLITUDE: 2 V
MDC IDC SET LEADCHNL RV PACING AMPLITUDE: 2.5 V
MDC IDC SET LEADCHNL RV PACING PULSEWIDTH: 0.4 ms
MDC IDC STAT BRADY AP VP PERCENT: 0.28 %
MDC IDC STAT BRADY AP VS PERCENT: 92.36 %
MDC IDC STAT BRADY AS VP PERCENT: 0.05 %
MDC IDC STAT BRADY AS VS PERCENT: 7.31 %
MDC IDC STAT BRADY RV PERCENT PACED: 0.44 %

## 2016-06-23 NOTE — Patient Instructions (Addendum)
Medication Instructions:  Your physician recommends that you continue on your current medications as directed. Please refer to the Current Medication list given to you today.   Labwork: None Ordered    Testing/Procedures: None Ordered   Follow-Up: Follow-up after results from cardiopulmonary test  Remote monitoring is used to monitor your Pacemaker  from home. This monitoring reduces the number of office visits required to check your device to one time per year. It allows us to keep an eye on the functioning of your device to ensure it is working properly. You are scheduled for a device check from home on 09/25/16. You may send your transmission at any time that day. If you have a wireless device, the transmission will be sent automatically. After your physician reviews your transmission, you will receive a postcard with your next transmission date.    Any Other Special Instructions Will Be Listed Below (If Applicable).     If you need a refill on your cardiac medications before your next appointment, please call your pharmacy.

## 2016-06-23 NOTE — Progress Notes (Signed)
HPI [name] returns today for followup. He is a 82 yo man with a h/o symptomatic bradycardia, HTN, and arthritis, s/p PPM.  He has also developed painful joints and was found to have psoriatic arthritis. He was initially placed on prednisone, and then switched to methotrexate. His joint discomfort is improved, but still present. He underwent PM generator change out several months ago. In the interim he has been more sob and had fatigue with exertion.  No Known Allergies   Current Outpatient Prescriptions  Medication Sig Dispense Refill  . folic acid (FOLVITE) 1 MG tablet Take 1 tablet by mouth daily.    . furosemide (LASIX) 20 MG tablet TAKE ONE TABLET BY MOUTH ONCE DAILY 90 tablet 3  . losartan-hydrochlorothiazide (HYZAAR) 100-25 MG tablet 1/2 tab by mouth twice a month    . methotrexate (50 MG/ML) 1 g injection Inject .8 ml once a week    . oxybutynin (DITROPAN) 5 MG tablet Take 2 tablets by mouth daily.    . simvastatin (ZOCOR) 20 MG tablet Take 20 mg by mouth at bedtime.     . tamsulosin (FLOMAX) 0.4 MG CAPS capsule Take 1 capsule by mouth daily.    Maurice Hall. XARELTO 20 MG TABS tablet TAKE ONE TABLET BY MOUTH ONCE DAILY 30 tablet 1   No current facility-administered medications for this visit.      Past Medical History:  Diagnosis Date  . Arthritis   . Dyslipidemia   . GERD (gastroesophageal reflux disease)   . HTN (hypertension)   . Paroxysmal atrial fibrillation (HCC)   . Symptomatic bradycardia    a. s/p MDT dual chamber PPM    ROS:   All systems reviewed and negative except as noted in the HPI.   Past Surgical History:  Procedure Laterality Date  . APPENDECTOMY  1946  . EP IMPLANTABLE DEVICE N/A 05/21/2015   Procedure:  PPM Generator Changeout;  Surgeon: Marinus MawGregg W Deondrae Mcgrail, MD;  [facility];  Service: Cardiovascular;  Laterality: N/A;  . EYE SURGERY  02/01/2007,09/02/2007   left eye, right eye  . right knee cartilage removed  [id number]  . right shoulder   08/26/1990   arthroscopy  . S/P PPM (Medtronic Sigma DDD  12/03  . STERIOD INJECTION  12/11/2011   Procedure: STEROID INJECTION;  Surgeon: Loanne DrillingFrank V Aluisio, MD;  Location: [facility];  Service: Orthopedics;  Laterality: Left;  . TONSILLECTOMY     age 89  . TOTAL KNEE ARTHROPLASTY  12/11/2011   Procedure: TOTAL KNEE ARTHROPLASTY;  Surgeon: Loanne DrillingFrank V Aluisio, MD;  Location: [facility];  Service: Orthopedics;  Laterality: Right;  Marland Kitchen. VASECTOMY  1968  . vocal cord growth   02/1999   removed growth on vocal cords     Family History  Problem Relation Age of Onset  . Coronary artery disease Neg Hx      Social History   Social History  . Marital status: Married    Spouse name: N/A  . Number of children: N/A  . Years of education: N/A   Occupational History  . Not on file.   Social History Main Topics  . Smoking status: Former Smoker    Packs/day: 3.00    Years: 15.00    Types: Cigarettes    Quit date: 12/04/1963  . Smokeless tobacco: Never Used  . Alcohol use No  . Drug use: No     Comment: Rare ETOH   . Sexual activity: Not on file   Other Topics Concern  .  Not on file   Social History Narrative  . No narrative on file     BP 130/60   Pulse 87   Ht 6\' 2"  (1.88 m)   Wt 232 lb (105.2 kg)   SpO2 97%   BMI 29.79 kg/m   Physical Exam:  Well appearing 82 year old man, NAD HEENT: Unremarkable Neck:  6 cm JVD, no thyromegally Lungs:  Clear with no wheezes, rales, or rhonchi. HEART:  Regular rate rhythm, no murmurs, no rubs, no clicks Abd:  soft, positive bowel sounds, no organomegally, no rebound, no guarding Ext:  2 plus pulses, no edema, no cyanosis, no clubbing Skin:  No rashes no nodules Neuro:  CN II through XII intact, motor grossly intact  DEVICE  Normal device function.  See PaceArt for details.   Assess/Plan:  1. Sob - the etiology is unclear. 2 years ago he had a negative dobutamine stress myoview. He does not have angina. I have asked him to undergo a  cardiopulmonary stress test. 2. Sinus node dysfunction - he is pacing over 90% of the time in the ventricle.  3. PPM - his medtronic DDD PM is working normally. Will recheck in several months. 4. PAF - he is anti-coagulated. He is out of rhythm very minimally.   Leonia ReevesGregg Caddie Randle,M.D.

## 2016-06-27 ENCOUNTER — Encounter: Payer: Medicare Other | Admitting: Internal Medicine

## 2016-07-06 ENCOUNTER — Ambulatory Visit (HOSPITAL_COMMUNITY): Payer: Medicare Other | Attending: Internal Medicine

## 2016-07-06 DIAGNOSIS — R0600 Dyspnea, unspecified: Secondary | ICD-10-CM | POA: Diagnosis not present

## 2016-07-06 DIAGNOSIS — I495 Sick sinus syndrome: Secondary | ICD-10-CM

## 2016-08-12 ENCOUNTER — Other Ambulatory Visit: Payer: Self-pay | Admitting: Internal Medicine

## 2016-08-14 NOTE — Telephone Encounter (Signed)
Received refill request for Xarelto. Pt has not had BMET in the past year or CBC drawn within the last 2 years.  Called PCP Dr Charlott RakesFrancisco Hodges to see if they had checked labs.   04/19/16: SCr 0.9 10/13/15: Hgb 14.9  CrCl = 1292mL/min, ok for pt to continue Xarelto 20mg  daily, refill sent in.

## 2016-09-02 ENCOUNTER — Other Ambulatory Visit: Payer: Self-pay | Admitting: Internal Medicine

## 2016-09-25 ENCOUNTER — Ambulatory Visit (INDEPENDENT_AMBULATORY_CARE_PROVIDER_SITE_OTHER): Payer: Medicare Other | Admitting: *Deleted

## 2016-09-25 ENCOUNTER — Telehealth: Payer: Self-pay | Admitting: Cardiology

## 2016-09-25 DIAGNOSIS — I495 Sick sinus syndrome: Secondary | ICD-10-CM | POA: Diagnosis not present

## 2016-09-25 NOTE — Telephone Encounter (Signed)
Spoke with pt and reminded pt of remote transmission that is due today. Pt verbalized understanding.   

## 2016-09-26 ENCOUNTER — Encounter: Payer: Self-pay | Admitting: Cardiology

## 2016-09-26 LAB — CUP PACEART REMOTE DEVICE CHECK
Battery Remaining Longevity: 96 mo
Brady Statistic AP VS Percent: 88.05 %
Brady Statistic AS VS Percent: 11.83 %
Date Time Interrogation Session: 20180219163912
Implantable Lead Implant Date: 20031201
Implantable Lead Implant Date: 20031201
Implantable Lead Location: 753860
Implantable Lead Model: 5076
Implantable Pulse Generator Implant Date: 20161014
Lead Channel Impedance Value: 380 Ohm
Lead Channel Pacing Threshold Amplitude: 0.625 V
Lead Channel Pacing Threshold Pulse Width: 0.4 ms
Lead Channel Sensing Intrinsic Amplitude: 1.625 mV
Lead Channel Sensing Intrinsic Amplitude: 1.625 mV
Lead Channel Sensing Intrinsic Amplitude: 12 mV
Lead Channel Setting Pacing Amplitude: 2.5 V
MDC IDC LEAD LOCATION: 753859
MDC IDC MSMT BATTERY VOLTAGE: 3.01 V
MDC IDC MSMT LEADCHNL RA IMPEDANCE VALUE: 323 Ohm
MDC IDC MSMT LEADCHNL RA PACING THRESHOLD AMPLITUDE: 0.875 V
MDC IDC MSMT LEADCHNL RV IMPEDANCE VALUE: 456 Ohm
MDC IDC MSMT LEADCHNL RV IMPEDANCE VALUE: 494 Ohm
MDC IDC MSMT LEADCHNL RV PACING THRESHOLD PULSEWIDTH: 0.4 ms
MDC IDC MSMT LEADCHNL RV SENSING INTR AMPL: 12 mV
MDC IDC SET LEADCHNL RA PACING AMPLITUDE: 2 V
MDC IDC SET LEADCHNL RV PACING PULSEWIDTH: 0.4 ms
MDC IDC SET LEADCHNL RV SENSING SENSITIVITY: 2.8 mV
MDC IDC STAT BRADY AP VP PERCENT: 0.1 %
MDC IDC STAT BRADY AS VP PERCENT: 0.02 %
MDC IDC STAT BRADY RA PERCENT PACED: 87.59 %
MDC IDC STAT BRADY RV PERCENT PACED: 0.14 %

## 2016-09-26 NOTE — Progress Notes (Signed)
Remote pacemaker transmission.   

## 2016-12-25 ENCOUNTER — Ambulatory Visit (INDEPENDENT_AMBULATORY_CARE_PROVIDER_SITE_OTHER): Payer: Medicare Other | Admitting: *Deleted

## 2016-12-25 DIAGNOSIS — I495 Sick sinus syndrome: Secondary | ICD-10-CM

## 2016-12-25 NOTE — Progress Notes (Signed)
Remote pacemaker transmission.   

## 2016-12-26 LAB — CUP PACEART REMOTE DEVICE CHECK
Battery Remaining Longevity: 94 mo
Battery Voltage: 3.01 V
Brady Statistic AP VP Percent: 0.22 %
Brady Statistic AP VS Percent: 87.81 %
Brady Statistic AS VP Percent: 0.02 %
Brady Statistic RA Percent Paced: 87.48 %
Brady Statistic RV Percent Paced: 0.33 %
Implantable Lead Implant Date: 20031201
Implantable Lead Implant Date: 20031201
Implantable Lead Location: 753859
Implantable Lead Location: 753860
Implantable Lead Model: 5076
Lead Channel Impedance Value: 304 Ohm
Lead Channel Impedance Value: 437 Ohm
Lead Channel Impedance Value: 494 Ohm
Lead Channel Pacing Threshold Amplitude: 1.125 V
Lead Channel Pacing Threshold Pulse Width: 0.4 ms
Lead Channel Sensing Intrinsic Amplitude: 1.25 mV
Lead Channel Sensing Intrinsic Amplitude: 1.25 mV
Lead Channel Sensing Intrinsic Amplitude: 10.625 mV
Lead Channel Sensing Intrinsic Amplitude: 10.625 mV
Lead Channel Setting Pacing Amplitude: 2.25 V
Lead Channel Setting Pacing Amplitude: 2.5 V
Lead Channel Setting Pacing Pulse Width: 0.4 ms
MDC IDC MSMT LEADCHNL RA IMPEDANCE VALUE: 361 Ohm
MDC IDC MSMT LEADCHNL RV PACING THRESHOLD AMPLITUDE: 0.625 V
MDC IDC MSMT LEADCHNL RV PACING THRESHOLD PULSEWIDTH: 0.4 ms
MDC IDC PG IMPLANT DT: 20161014
MDC IDC SESS DTM: 20180521153834
MDC IDC SET LEADCHNL RV SENSING SENSITIVITY: 2.8 mV
MDC IDC STAT BRADY AS VS PERCENT: 11.95 %

## 2016-12-27 ENCOUNTER — Encounter: Payer: Self-pay | Admitting: Cardiology

## 2017-01-10 ENCOUNTER — Encounter: Payer: Self-pay | Admitting: Cardiology

## 2017-02-09 ENCOUNTER — Other Ambulatory Visit: Payer: Self-pay | Admitting: Internal Medicine

## 2017-02-09 NOTE — Telephone Encounter (Signed)
SCr 1.01 at PCP on 09/20/16. OV with Dr Ladona Ridgelaylor 06/2016. Wt 105.2Kg. Creatine clearance 82.3246mL/min.  Xarelto 20mg  QD reordered.

## 2017-03-26 ENCOUNTER — Ambulatory Visit (INDEPENDENT_AMBULATORY_CARE_PROVIDER_SITE_OTHER): Payer: Medicare Other | Admitting: *Deleted

## 2017-03-26 ENCOUNTER — Telehealth: Payer: Self-pay | Admitting: Internal Medicine

## 2017-03-26 DIAGNOSIS — I495 Sick sinus syndrome: Secondary | ICD-10-CM

## 2017-03-26 NOTE — Telephone Encounter (Signed)
Returned patient's call.  Advised that we did receive his transmission sent earlier today.  Reminded patient that transmission can be sent any time on the day it is scheduled and that he can disregard the "appointment time."  Patient is appreciative and denies additional questions or concerns at this time.

## 2017-03-26 NOTE — Telephone Encounter (Signed)
New Message    Please call regarding his transmission

## 2017-03-27 NOTE — Progress Notes (Signed)
Remote pacemaker transmission.   

## 2017-03-28 LAB — CUP PACEART REMOTE DEVICE CHECK
Brady Statistic AP VP Percent: 0.13 %
Brady Statistic AP VS Percent: 88.76 %
Brady Statistic AS VP Percent: 0.04 %
Brady Statistic RA Percent Paced: 88.26 %
Brady Statistic RV Percent Paced: 0.19 %
Date Time Interrogation Session: 20180820190532
Implantable Lead Implant Date: 20031201
Implantable Lead Implant Date: 20031201
Implantable Lead Location: 753859
Implantable Lead Location: 753860
Implantable Lead Model: 5076
Lead Channel Impedance Value: 304 Ohm
Lead Channel Pacing Threshold Amplitude: 1 V
Lead Channel Pacing Threshold Pulse Width: 0.4 ms
Lead Channel Pacing Threshold Pulse Width: 0.4 ms
Lead Channel Sensing Intrinsic Amplitude: 1.25 mV
Lead Channel Sensing Intrinsic Amplitude: 1.25 mV
Lead Channel Setting Pacing Amplitude: 2 V
MDC IDC MSMT BATTERY REMAINING LONGEVITY: 93 mo
MDC IDC MSMT BATTERY VOLTAGE: 3.01 V
MDC IDC MSMT LEADCHNL RA IMPEDANCE VALUE: 361 Ohm
MDC IDC MSMT LEADCHNL RV IMPEDANCE VALUE: 475 Ohm
MDC IDC MSMT LEADCHNL RV IMPEDANCE VALUE: 532 Ohm
MDC IDC MSMT LEADCHNL RV PACING THRESHOLD AMPLITUDE: 0.625 V
MDC IDC MSMT LEADCHNL RV SENSING INTR AMPL: 10.5 mV
MDC IDC MSMT LEADCHNL RV SENSING INTR AMPL: 10.5 mV
MDC IDC PG IMPLANT DT: 20161014
MDC IDC SET LEADCHNL RV PACING AMPLITUDE: 2.5 V
MDC IDC SET LEADCHNL RV PACING PULSEWIDTH: 0.4 ms
MDC IDC SET LEADCHNL RV SENSING SENSITIVITY: 2.8 mV
MDC IDC STAT BRADY AS VS PERCENT: 11.07 %

## 2017-04-06 ENCOUNTER — Encounter: Payer: Self-pay | Admitting: Cardiology

## 2017-06-03 ENCOUNTER — Other Ambulatory Visit: Payer: Self-pay | Admitting: Internal Medicine

## 2017-06-25 ENCOUNTER — Ambulatory Visit (INDEPENDENT_AMBULATORY_CARE_PROVIDER_SITE_OTHER): Payer: Medicare Other | Admitting: *Deleted

## 2017-06-25 DIAGNOSIS — I495 Sick sinus syndrome: Secondary | ICD-10-CM | POA: Diagnosis not present

## 2017-06-25 NOTE — Progress Notes (Signed)
Remote pacemaker transmission.   

## 2017-06-26 LAB — CUP PACEART REMOTE DEVICE CHECK
Battery Remaining Longevity: 91 mo
Battery Voltage: 3.01 V
Brady Statistic AP VP Percent: 0.27 %
Brady Statistic AS VS Percent: 11.16 %
Brady Statistic RV Percent Paced: 0.53 %
Date Time Interrogation Session: 20181119173356
Implantable Lead Implant Date: 20031201
Implantable Lead Location: 753860
Implantable Lead Model: 5076
Implantable Pulse Generator Implant Date: 20161014
Lead Channel Impedance Value: 494 Ohm
Lead Channel Impedance Value: 551 Ohm
Lead Channel Pacing Threshold Amplitude: 0.75 V
Lead Channel Sensing Intrinsic Amplitude: 0.875 mV
Lead Channel Setting Pacing Amplitude: 1.5 V
Lead Channel Setting Sensing Sensitivity: 2.8 mV
MDC IDC LEAD IMPLANT DT: 20031201
MDC IDC LEAD LOCATION: 753859
MDC IDC MSMT LEADCHNL RA IMPEDANCE VALUE: 323 Ohm
MDC IDC MSMT LEADCHNL RA IMPEDANCE VALUE: 380 Ohm
MDC IDC MSMT LEADCHNL RA PACING THRESHOLD PULSEWIDTH: 0.4 ms
MDC IDC MSMT LEADCHNL RA SENSING INTR AMPL: 0.875 mV
MDC IDC MSMT LEADCHNL RV PACING THRESHOLD AMPLITUDE: 0.75 V
MDC IDC MSMT LEADCHNL RV PACING THRESHOLD PULSEWIDTH: 0.4 ms
MDC IDC MSMT LEADCHNL RV SENSING INTR AMPL: 11 mV
MDC IDC MSMT LEADCHNL RV SENSING INTR AMPL: 11 mV
MDC IDC SET LEADCHNL RV PACING AMPLITUDE: 2.5 V
MDC IDC SET LEADCHNL RV PACING PULSEWIDTH: 0.4 ms
MDC IDC STAT BRADY AP VS PERCENT: 88.41 %
MDC IDC STAT BRADY AS VP PERCENT: 0.17 %
MDC IDC STAT BRADY RA PERCENT PACED: 87.71 %

## 2017-07-05 ENCOUNTER — Encounter: Payer: Self-pay | Admitting: Cardiology

## 2017-07-18 ENCOUNTER — Ambulatory Visit: Payer: Medicare Other | Admitting: Internal Medicine

## 2017-07-18 ENCOUNTER — Encounter: Payer: Self-pay | Admitting: Internal Medicine

## 2017-07-18 VITALS — BP 108/58 | HR 62 | Ht 74.0 in | Wt 231.0 lb

## 2017-07-18 DIAGNOSIS — I480 Paroxysmal atrial fibrillation: Secondary | ICD-10-CM

## 2017-07-18 DIAGNOSIS — I495 Sick sinus syndrome: Secondary | ICD-10-CM

## 2017-07-18 DIAGNOSIS — Z950 Presence of cardiac pacemaker: Secondary | ICD-10-CM

## 2017-07-18 LAB — CUP PACEART INCLINIC DEVICE CHECK
Brady Statistic AP VP Percent: 0.17 %
Brady Statistic AP VS Percent: 88.51 %
Brady Statistic AS VP Percent: 0.06 %
Brady Statistic AS VS Percent: 11.26 %
Brady Statistic RV Percent Paced: 0.29 %
Date Time Interrogation Session: 20181212134734
Implantable Lead Implant Date: 20031201
Implantable Lead Location: 753860
Implantable Pulse Generator Implant Date: 20161014
Lead Channel Impedance Value: 323 Ohm
Lead Channel Impedance Value: 475 Ohm
Lead Channel Impedance Value: 532 Ohm
Lead Channel Pacing Threshold Amplitude: 0.625 V
Lead Channel Pacing Threshold Amplitude: 0.75 V
Lead Channel Pacing Threshold Pulse Width: 0.4 ms
Lead Channel Sensing Intrinsic Amplitude: 10.5 mV
Lead Channel Sensing Intrinsic Amplitude: 12.5 mV
Lead Channel Sensing Intrinsic Amplitude: 2.375 mV
Lead Channel Sensing Intrinsic Amplitude: 2.375 mV
Lead Channel Setting Pacing Amplitude: 1.75 V
MDC IDC LEAD IMPLANT DT: 20031201
MDC IDC LEAD LOCATION: 753859
MDC IDC MSMT BATTERY REMAINING LONGEVITY: 90 mo
MDC IDC MSMT BATTERY VOLTAGE: 3.01 V
MDC IDC MSMT LEADCHNL RA IMPEDANCE VALUE: 361 Ohm
MDC IDC MSMT LEADCHNL RA PACING THRESHOLD PULSEWIDTH: 0.4 ms
MDC IDC SET LEADCHNL RV PACING AMPLITUDE: 2.5 V
MDC IDC SET LEADCHNL RV PACING PULSEWIDTH: 0.4 ms
MDC IDC SET LEADCHNL RV SENSING SENSITIVITY: 2.8 mV
MDC IDC STAT BRADY RA PERCENT PACED: 88 %

## 2017-07-18 NOTE — Progress Notes (Signed)
HPI Mr. Fay RecordsMicka returns today for ongoing evaluation and management of sinus bradycardia, status post pacemaker insertion, with a history of hypertension.  Since I saw him last, he has been stable but admits to being sedentary.  Previously he exercise regularly but has not done so for nearly a year.  When asked why, he does not have any good reason but states that he wants to start back exercising.  He denies chest pain, shortness of breath, or syncope.  He notes an occasional episode of dizziness or lightheadedness or weakness which lasted for a few minutes and resolved spontaneously.  He denies peripheral edema.  His appetite is good. No Known Allergies   Current Outpatient Medications  Medication Sig Dispense Refill  . folic acid (FOLVITE) 1 MG tablet Take 1 tablet by mouth daily.    . furosemide (LASIX) 20 MG tablet Take 1 tablet (20 mg total) by mouth daily. Please keep upcoming appt with Dr. Ladona Ridgelaylor for future refills. 90 tablet 0  . losartan-hydrochlorothiazide (HYZAAR) 100-25 MG tablet 1/2 tab by mouth twice a month    . methotrexate (50 MG/ML) 1 g injection Inject .8 ml once a week    . oxybutynin (DITROPAN) 5 MG tablet Take 2 tablets by mouth daily.    . simvastatin (ZOCOR) 20 MG tablet Take 20 mg by mouth at bedtime.     . tamsulosin (FLOMAX) 0.4 MG CAPS capsule Take 1 capsule by mouth daily.    Carlena Hurl. XARELTO 20 MG TABS tablet TAKE ONE TABLET BY MOUTH ONCE DAILY 90 tablet 3   No current facility-administered medications for this visit.      Past Medical History:  Diagnosis Date  . Arthritis   . Dyslipidemia   . GERD (gastroesophageal reflux disease)   . HTN (hypertension)   . Paroxysmal atrial fibrillation (HCC)   . Symptomatic bradycardia    a. s/p MDT dual chamber PPM    ROS:   All systems reviewed and negative except as noted in the HPI.   Past Surgical History:  Procedure Laterality Date  . APPENDECTOMY  1946  . EP IMPLANTABLE DEVICE N/A 05/21/2015   Procedure:  PPM Generator Changeout;  Surgeon: Marinus MawGregg W Taylor, MD;  [facility];  Service: Cardiovascular;  Laterality: N/A;  . EYE SURGERY  02/01/2007,09/02/2007   left eye, right eye  . right knee cartilage removed  [id number]  . right shoulder  08/26/1990   arthroscopy  . S/P PPM (Medtronic Sigma DDD  12/03  . STERIOD INJECTION  12/11/2011   Procedure: STEROID INJECTION;  Surgeon: Loanne DrillingFrank V Aluisio, MD;  Location: [facility];  Service: Orthopedics;  Laterality: Left;  . TONSILLECTOMY     age 83  . TOTAL KNEE ARTHROPLASTY  12/11/2011   Procedure: TOTAL KNEE ARTHROPLASTY;  Surgeon: Loanne DrillingFrank V Aluisio, MD;  Location: [facility];  Service: Orthopedics;  Laterality: Right;  Marland Kitchen. VASECTOMY  1968  . vocal cord growth   02/1999   removed growth on vocal cords     Family History  Problem Relation Age of Onset  . Coronary artery disease Neg Hx      Social History   Socioeconomic History  . Marital status: Married    Spouse name: Not on file  . Number of children: Not on file  . Years of education: Not on file  . Highest education level: Not on file  Social Needs  . Financial resource strain: Not on file  . Food insecurity -  worry: Not on file  . Food insecurity - inability: Not on file  . Transportation needs - medical: Not on file  . Transportation needs - non-medical: Not on file  Occupational History  . Not on file  Tobacco Use  . Smoking status: Former Smoker    Packs/day: 3.00    Years: 15.00    Pack years: 45.00    Types: Cigarettes    Last attempt to quit: 12/04/1963    Years since quitting: 53.6  . Smokeless tobacco: Never Used  Substance and Sexual Activity  . Alcohol use: No  . Drug use: No    Comment: Rare ETOH   . Sexual activity: Not on file  Other Topics Concern  . Not on file  Social History Narrative  . Not on file     BP (!) 108/58   Pulse 62   Ht 6\' 2"  (1.88 m)   Wt 231 lb (104.8 kg)   SpO2 93%   BMI 29.66 kg/m   Physical Exam:  Well  appearing 83 year old man, NAD HEENT: Unremarkable Neck: 6 cm JVD, no thyromegally Lymphatics:  No adenopathy Back:  No CVA tenderness Lungs:  Clear, with no wheezes, rales, or rhonchi HEART:  Regular rate rhythm, no murmurs, no rubs, no clicks Abd:  soft, positive bowel sounds, no organomegally, no rebound, no guarding Ext:  2 plus pulses, no edema, no cyanosis, no clubbing Skin:  No rashes no nodules Neuro:  CN II through XII intact, motor grossly intact  EKG -normal sinus rhythm with atrial pacing  DEVICE  Normal device function.  See PaceArt for details.   Assess/Plan: 1.  Sinus node dysfunction -he is asymptomatic status post pacemaker insertion 2.  Hypertension -his blood pressure is now well controlled.  He is taking his losartan on an as-needed basis. 3.  Pacemaker -his Medtronic dual-chamber pacemaker is working normally.  We will plan to recheck in several months.  Sharrell KuGreg Taylor, MD

## 2017-07-18 NOTE — Patient Instructions (Addendum)
Medication Instructions:  Your physician recommends that you continue on your current medications as directed. Please refer to the Current Medication list given to you today.  Labwork: None ordered.  Testing/Procedures: None ordered.  Follow-Up: Your physician wants you to follow-up in: 6 months with Dr. Ladona Ridgelaylor.   You will receive a reminder letter in the mail two months in advance. If you don't receive a letter, please call our office to schedule the follow-up appointment.  Remote monitoring is used to monitor your Pacemaker from home. This monitoring reduces the number of office visits required to check your device to one time per year. It allows us to keep an eye on the functioning of your device to ensure it is working properly. You are scheduled for a device check from home on 09/24/2017. You may send your transmission at any time that day. If you have a wireless device, the transmission will be sent automatically. After your physician reviews your transmission, you will receive a postcard with your next transmission date.   Any Other Special Instructions Will Be Listed Below (If Applicable).  If you need a refill on your cardiac medications before your next appointment, please call your pharmacy.

## 2017-09-01 ENCOUNTER — Other Ambulatory Visit: Payer: Self-pay | Admitting: Internal Medicine

## 2017-09-24 ENCOUNTER — Ambulatory Visit (INDEPENDENT_AMBULATORY_CARE_PROVIDER_SITE_OTHER): Payer: Medicare Other | Admitting: *Deleted

## 2017-09-24 DIAGNOSIS — I495 Sick sinus syndrome: Secondary | ICD-10-CM | POA: Diagnosis not present

## 2017-09-25 LAB — CUP PACEART REMOTE DEVICE CHECK
Battery Remaining Longevity: 88 mo
Battery Voltage: 3.01 V
Brady Statistic AP VS Percent: 90.22 %
Brady Statistic AS VS Percent: 9.64 %
Brady Statistic RV Percent Paced: 0.18 %
Date Time Interrogation Session: 20190218155255
Implantable Lead Implant Date: 20031201
Implantable Lead Location: 753860
Implantable Pulse Generator Implant Date: 20161014
Lead Channel Impedance Value: 380 Ohm
Lead Channel Impedance Value: 513 Ohm
Lead Channel Pacing Threshold Amplitude: 0.5 V
Lead Channel Pacing Threshold Amplitude: 0.875 V
Lead Channel Pacing Threshold Pulse Width: 0.4 ms
Lead Channel Pacing Threshold Pulse Width: 0.4 ms
Lead Channel Sensing Intrinsic Amplitude: 1.125 mV
Lead Channel Sensing Intrinsic Amplitude: 10.375 mV
Lead Channel Setting Pacing Amplitude: 2.25 V
Lead Channel Setting Pacing Amplitude: 2.5 V
Lead Channel Setting Sensing Sensitivity: 2.8 mV
MDC IDC LEAD IMPLANT DT: 20031201
MDC IDC LEAD LOCATION: 753859
MDC IDC MSMT LEADCHNL RA IMPEDANCE VALUE: 304 Ohm
MDC IDC MSMT LEADCHNL RA SENSING INTR AMPL: 1.125 mV
MDC IDC MSMT LEADCHNL RV IMPEDANCE VALUE: 456 Ohm
MDC IDC MSMT LEADCHNL RV SENSING INTR AMPL: 10.375 mV
MDC IDC SET LEADCHNL RV PACING PULSEWIDTH: 0.4 ms
MDC IDC STAT BRADY AP VP PERCENT: 0.12 %
MDC IDC STAT BRADY AS VP PERCENT: 0.02 %
MDC IDC STAT BRADY RA PERCENT PACED: 89.88 %

## 2017-09-25 NOTE — Progress Notes (Signed)
Remote pacemaker transmission.   

## 2017-09-27 ENCOUNTER — Encounter: Payer: Self-pay | Admitting: Cardiology

## 2017-12-24 ENCOUNTER — Telehealth: Payer: Self-pay | Admitting: Cardiology

## 2017-12-24 ENCOUNTER — Ambulatory Visit (INDEPENDENT_AMBULATORY_CARE_PROVIDER_SITE_OTHER): Payer: Medicare Other | Admitting: *Deleted

## 2017-12-24 DIAGNOSIS — I495 Sick sinus syndrome: Secondary | ICD-10-CM

## 2017-12-24 NOTE — Telephone Encounter (Signed)
Spoke with pt and reminded pt of remote transmission that is due today. Pt verbalized understanding.   

## 2017-12-25 ENCOUNTER — Encounter: Payer: Self-pay | Admitting: Cardiology

## 2017-12-25 NOTE — Progress Notes (Signed)
Remote pacemaker transmission.   

## 2017-12-25 NOTE — Progress Notes (Signed)
Letter  

## 2017-12-26 LAB — CUP PACEART REMOTE DEVICE CHECK
Battery Remaining Longevity: 87 mo
Brady Statistic AP VS Percent: 93.7 %
Brady Statistic AS VP Percent: 0.02 %
Brady Statistic RA Percent Paced: 93.51 %
Brady Statistic RV Percent Paced: 0.15 %
Date Time Interrogation Session: 20190520194227
Implantable Lead Implant Date: 20031201
Implantable Lead Implant Date: 20031201
Implantable Lead Location: 753859
Implantable Lead Location: 753860
Lead Channel Impedance Value: 304 Ohm
Lead Channel Impedance Value: 418 Ohm
Lead Channel Impedance Value: 475 Ohm
Lead Channel Pacing Threshold Amplitude: 0.875 V
Lead Channel Pacing Threshold Pulse Width: 0.4 ms
Lead Channel Pacing Threshold Pulse Width: 0.4 ms
Lead Channel Sensing Intrinsic Amplitude: 1 mV
Lead Channel Sensing Intrinsic Amplitude: 1 mV
Lead Channel Sensing Intrinsic Amplitude: 10 mV
Lead Channel Sensing Intrinsic Amplitude: 10 mV
Lead Channel Setting Pacing Pulse Width: 0.4 ms
MDC IDC MSMT BATTERY VOLTAGE: 3.01 V
MDC IDC MSMT LEADCHNL RA IMPEDANCE VALUE: 361 Ohm
MDC IDC MSMT LEADCHNL RV PACING THRESHOLD AMPLITUDE: 0.875 V
MDC IDC PG IMPLANT DT: 20161014
MDC IDC SET LEADCHNL RA PACING AMPLITUDE: 1.75 V
MDC IDC SET LEADCHNL RV PACING AMPLITUDE: 2.5 V
MDC IDC SET LEADCHNL RV SENSING SENSITIVITY: 2.8 mV
MDC IDC STAT BRADY AP VP PERCENT: 0.11 %
MDC IDC STAT BRADY AS VS PERCENT: 6.17 %

## 2018-01-16 ENCOUNTER — Encounter: Payer: Self-pay | Admitting: Internal Medicine

## 2018-01-16 ENCOUNTER — Ambulatory Visit: Payer: Medicare Other | Admitting: Internal Medicine

## 2018-01-16 VITALS — BP 102/60 | HR 60 | Ht 74.0 in | Wt 222.0 lb

## 2018-01-16 DIAGNOSIS — Z950 Presence of cardiac pacemaker: Secondary | ICD-10-CM

## 2018-01-16 DIAGNOSIS — I480 Paroxysmal atrial fibrillation: Secondary | ICD-10-CM

## 2018-01-16 DIAGNOSIS — I495 Sick sinus syndrome: Secondary | ICD-10-CM | POA: Diagnosis not present

## 2018-01-16 NOTE — Progress Notes (Addendum)
HPI [name] RecordsMicka returns today for ongoing evaluation and management of sinus bradycardia, status post pacemaker insertion, with a history of hypertension.  Since I saw him last, he has been stable but admits to being sedentary.  Previously he exercise regularly but has not done so for nearly a year.  When asked why, he does not have any good reason but states that he wants to start back exercising.  He denies chest pain, shortness of breath, or syncope.  He notes an occasional episode of dizziness or lightheadedness or weakness which lasted for a few minutes and resolved spontaneously.  He denies peripheral edema.  His appetite is good.  No Known Allergies   Current Outpatient Medications  Medication Sig Dispense Refill  . acetaminophen (TYLENOL) 500 MG tablet 500 mg. Take 3 tablets by mouth every morning    . folic acid (FOLVITE) 1 MG tablet Take 1 tablet by mouth daily.    . furosemide (LASIX) 20 MG tablet Take 1 tablet (20 mg total) by mouth daily. 90 tablet 2  . losartan-hydrochlorothiazide (HYZAAR) 100-25 MG tablet Take 1/2 tablet by mouth as needed    . methotrexate (50 MG/ML) 1 g injection Inject .8 ml once a week    . Omega-3 Fatty Acids (FISH OIL) 1000 MG CAPS Take 1,000 mg by mouth daily.    Marland Kitchen. oxybutynin (DITROPAN) 5 MG tablet Take 2 tablets by mouth daily.    . simvastatin (ZOCOR) 20 MG tablet Take 20 mg by mouth at bedtime.     . tamsulosin (FLOMAX) 0.4 MG CAPS capsule Take 1 capsule by mouth daily.    Carlena Hurl. XARELTO 20 MG TABS tablet TAKE ONE TABLET BY MOUTH ONCE DAILY 90 tablet 3   No current facility-administered medications for this visit.      Past Medical History:  Diagnosis Date  . Arthritis   . Dyslipidemia   . GERD (gastroesophageal reflux disease)   . HTN (hypertension)   . Paroxysmal atrial fibrillation (HCC)   . Symptomatic bradycardia    a. s/p MDT dual chamber PPM    ROS:   All systems reviewed and negative except as noted in the HPI.   Past  Surgical History:  Procedure Laterality Date  . APPENDECTOMY  1946  . EP IMPLANTABLE DEVICE N/A 05/21/2015   Procedure:  PPM Generator Changeout;  Surgeon: Marinus MawGregg W Kansas Spainhower, MD;  [facility];  Service: Cardiovascular;  Laterality: N/A;  . EYE SURGERY  02/01/2007,09/02/2007   left eye, right eye  . right knee cartilage removed  [id number]  . right shoulder  08/26/1990   arthroscopy  . S/P PPM (Medtronic Sigma DDD  12/03  . STERIOD INJECTION  12/11/2011   Procedure: STEROID INJECTION;  Surgeon: Loanne DrillingFrank V Aluisio, MD;  Location: [facility];  Service: Orthopedics;  Laterality: Left;  . TONSILLECTOMY     age 89  . TOTAL KNEE ARTHROPLASTY  12/11/2011   Procedure: TOTAL KNEE ARTHROPLASTY;  Surgeon: Loanne DrillingFrank V Aluisio, MD;  Location: [facility];  Service: Orthopedics;  Laterality: Right;  Marland Kitchen. VASECTOMY  1968  . vocal cord growth   02/1999   removed growth on vocal cords     Family History  Problem Relation Age of Onset  . Coronary artery disease Neg Hx      Social History   Socioeconomic History  . Marital status: Married    Spouse name: Not on file  . Number of children: Not on file  . Years of  education: Not on file  . Highest education level: Not on file  Occupational History  . Not on file  Social Needs  . Financial resource strain: Not on file  . Food insecurity:    Worry: Not on file    Inability: Not on file  . Transportation needs:    Medical: Not on file    Non-medical: Not on file  Tobacco Use  . Smoking status: Former Smoker    Packs/day: 3.00    Years: 15.00    Pack years: 45.00    Types: Cigarettes    Last attempt to quit: 12/04/1963    Years since quitting: 54.1  . Smokeless tobacco: Never Used  Substance and Sexual Activity  . Alcohol use: No  . Drug use: No    Comment: Rare ETOH   . Sexual activity: Not on file  Lifestyle  . Physical activity:    Days per week: Not on file    Minutes per session: Not on file  . Stress: Not on file  Relationships  .  Social connections:    Talks on phone: Not on file    Gets together: Not on file    Attends religious service: Not on file    Active member of club or organization: Not on file    Attends meetings of clubs or organizations: Not on file    Relationship status: Not on file  . Intimate partner violence:    Fear of current or ex partner: Not on file    Emotionally abused: Not on file    Physically abused: Not on file    Forced sexual activity: Not on file  Other Topics Concern  . Not on file  Social History Narrative  . Not on file     BP 102/60   Pulse 60   Ht 6\' 2"  (1.88 m)   Wt 222 lb (100.7 kg)   BMI 28.50 kg/m   Physical Exam:  Well appearing NAD HEENT: Unremarkable Neck:  No JVD, no thyromegally Lymphatics:  No adenopathy Back:  No CVA tenderness Lungs:  Clear HEART:  Regular rate rhythm, no murmurs, no rubs, no clicks Abd:  soft, positive bowel sounds, no organomegally, no rebound, no guarding Ext:  2 plus pulses, no edema, no cyanosis, no clubbing Skin:  No rashes no nodules Neuro:  CN II through XII intact, motor grossly intact   DEVICE  Normal device function.  See PaceArt for details.   Assess/Plan: 1. PAF - he is maintaining NSR over 99% of the time. He will continue his current meds. 2. HTN - his blood pressure is good today. We will follow 3. PPM - his medtronic DDD PM is working normally. Will follow. 4. Dyslipidemia - he is tolerating his statin therapy nicely. No change in meds.   Leonia Reeves.D.

## 2018-01-16 NOTE — Patient Instructions (Addendum)
Medication Instructions:  Your physician recommends that you continue on your current medications as directed. Please refer to the Current Medication list given to you today.  Labwork: None ordered.  Testing/Procedures: None ordered.  Follow-Up: Your physician wants you to follow-up in: 6 months with Dr. Ladona Ridgelaylor.   You will receive a reminder letter in the mail two months in advance. If you don't receive a letter, please call our office to schedule the follow-up appointment.  Remote monitoring is used to monitor your Pacemaker from home. This monitoring reduces the number of office visits required to check your device to one time per year. It allows us to keep an eye on the functioning of your device to ensure it is working properly. You are scheduled for a device check from home on 03/25/2018. You may send your transmission at any time that day. If you have a wireless device, the transmission will be sent automatically. After your physician reviews your transmission, you will receive a postcard with your next transmission date.  Any Other Special Instructions Will Be Listed Below (If Applicable).  If you need a refill on your cardiac medications before your next appointment, please call your pharmacy.

## 2018-02-20 LAB — CUP PACEART INCLINIC DEVICE CHECK
Brady Statistic AP VS Percent: 92.62 %
Brady Statistic AS VP Percent: 0.02 %
Brady Statistic RA Percent Paced: 92.37 %
Implantable Lead Location: 753859
Implantable Lead Model: 5076
Implantable Lead Model: 5076
Implantable Pulse Generator Implant Date: 20161014
Lead Channel Impedance Value: 361 Ohm
Lead Channel Impedance Value: 437 Ohm
Lead Channel Pacing Threshold Pulse Width: 0.4 ms
Lead Channel Sensing Intrinsic Amplitude: 10.875 mV
Lead Channel Sensing Intrinsic Amplitude: 11.25 mV
Lead Channel Setting Pacing Amplitude: 1.75 V
Lead Channel Setting Pacing Pulse Width: 0.4 ms
Lead Channel Setting Sensing Sensitivity: 2.8 mV
MDC IDC LEAD IMPLANT DT: 20031201
MDC IDC LEAD IMPLANT DT: 20031201
MDC IDC LEAD LOCATION: 753860
MDC IDC MSMT BATTERY REMAINING LONGEVITY: 86 mo
MDC IDC MSMT BATTERY VOLTAGE: 3.01 V
MDC IDC MSMT LEADCHNL RA IMPEDANCE VALUE: 304 Ohm
MDC IDC MSMT LEADCHNL RA PACING THRESHOLD AMPLITUDE: 0.5 V
MDC IDC MSMT LEADCHNL RA PACING THRESHOLD PULSEWIDTH: 0.4 ms
MDC IDC MSMT LEADCHNL RA SENSING INTR AMPL: 2 mV
MDC IDC MSMT LEADCHNL RV IMPEDANCE VALUE: 494 Ohm
MDC IDC MSMT LEADCHNL RV PACING THRESHOLD AMPLITUDE: 0.75 V
MDC IDC SESS DTM: 20190612135248
MDC IDC SET LEADCHNL RV PACING AMPLITUDE: 2.5 V
MDC IDC STAT BRADY AP VP PERCENT: 0.12 %
MDC IDC STAT BRADY AS VS PERCENT: 7.24 %
MDC IDC STAT BRADY RV PERCENT PACED: 0.16 %

## 2018-03-04 ENCOUNTER — Other Ambulatory Visit: Payer: Self-pay | Admitting: Internal Medicine

## 2018-03-04 NOTE — Telephone Encounter (Signed)
Pt last saw Dr Ladona Ridgelaylor 01/16/18, last labs 01/07/18 Creat 1.09 [facility], age 84, weight 100.7kg, CrCl 71.86, based on CrCl pt is on appropriate dosage of Xarelto 20mg  QD.  Will refill rx.

## 2018-03-25 ENCOUNTER — Ambulatory Visit (INDEPENDENT_AMBULATORY_CARE_PROVIDER_SITE_OTHER): Payer: Medicare Other | Admitting: *Deleted

## 2018-03-25 DIAGNOSIS — I495 Sick sinus syndrome: Secondary | ICD-10-CM

## 2018-03-26 NOTE — Progress Notes (Signed)
Remote pacemaker transmission.   

## 2018-04-19 LAB — CUP PACEART REMOTE DEVICE CHECK
Battery Remaining Longevity: 85 mo
Battery Voltage: 3.01 V
Brady Statistic AP VP Percent: 0.12 %
Brady Statistic AS VS Percent: 6.95 %
Brady Statistic RA Percent Paced: 92.67 %
Brady Statistic RV Percent Paced: 0.15 %
Date Time Interrogation Session: 20190819132304
Implantable Lead Implant Date: 20031201
Implantable Lead Location: 753860
Implantable Pulse Generator Implant Date: 20161014
Lead Channel Impedance Value: 532 Ohm
Lead Channel Pacing Threshold Amplitude: 0.75 V
Lead Channel Pacing Threshold Amplitude: 0.75 V
Lead Channel Pacing Threshold Pulse Width: 0.4 ms
Lead Channel Sensing Intrinsic Amplitude: 0.625 mV
Lead Channel Setting Sensing Sensitivity: 2.8 mV
MDC IDC LEAD IMPLANT DT: 20031201
MDC IDC LEAD LOCATION: 753859
MDC IDC MSMT LEADCHNL RA IMPEDANCE VALUE: 304 Ohm
MDC IDC MSMT LEADCHNL RA IMPEDANCE VALUE: 361 Ohm
MDC IDC MSMT LEADCHNL RA SENSING INTR AMPL: 0.625 mV
MDC IDC MSMT LEADCHNL RV IMPEDANCE VALUE: 475 Ohm
MDC IDC MSMT LEADCHNL RV PACING THRESHOLD PULSEWIDTH: 0.4 ms
MDC IDC MSMT LEADCHNL RV SENSING INTR AMPL: 11 mV
MDC IDC MSMT LEADCHNL RV SENSING INTR AMPL: 11 mV
MDC IDC SET LEADCHNL RA PACING AMPLITUDE: 1.75 V
MDC IDC SET LEADCHNL RV PACING AMPLITUDE: 2.5 V
MDC IDC SET LEADCHNL RV PACING PULSEWIDTH: 0.4 ms
MDC IDC STAT BRADY AP VS PERCENT: 92.92 %
MDC IDC STAT BRADY AS VP PERCENT: 0.01 %

## 2018-06-05 ENCOUNTER — Other Ambulatory Visit: Payer: Self-pay | Admitting: Internal Medicine

## 2018-06-24 ENCOUNTER — Ambulatory Visit (INDEPENDENT_AMBULATORY_CARE_PROVIDER_SITE_OTHER): Payer: Medicare Other

## 2018-06-24 DIAGNOSIS — I495 Sick sinus syndrome: Secondary | ICD-10-CM

## 2018-06-25 NOTE — Progress Notes (Signed)
Remote pacemaker transmission.   

## 2018-06-27 ENCOUNTER — Encounter: Payer: Self-pay | Admitting: Cardiology

## 2018-07-08 ENCOUNTER — Encounter: Payer: Self-pay | Admitting: Internal Medicine

## 2018-07-15 ENCOUNTER — Encounter: Payer: Self-pay | Admitting: Internal Medicine

## 2018-07-15 ENCOUNTER — Ambulatory Visit: Payer: Medicare Other | Admitting: Internal Medicine

## 2018-07-15 VITALS — BP 138/68 | HR 64 | Ht 74.0 in | Wt 232.0 lb

## 2018-07-15 DIAGNOSIS — Z950 Presence of cardiac pacemaker: Secondary | ICD-10-CM

## 2018-07-15 DIAGNOSIS — I495 Sick sinus syndrome: Secondary | ICD-10-CM

## 2018-07-15 DIAGNOSIS — I480 Paroxysmal atrial fibrillation: Secondary | ICD-10-CM

## 2018-07-15 MED ORDER — FUROSEMIDE 20 MG PO TABS: 20.0000 mg | ORAL_TABLET | Freq: Every day | ORAL | 3 refills | Status: DC | PRN

## 2018-07-15 NOTE — Patient Instructions (Addendum)
Medication Instructions:  Your physician has recommended you make the following change in your medication:   1.  Change how you take your FUROSEMIDE 20 mg---Take one tablet by mouth AS NEEDED for swelling or shortness of breath. You Do NOT need to take it every day anymore.  Labwork: None ordered.  Testing/Procedures: None ordered.  Follow-Up: Your physician wants you to follow-up in: 6 months with Dr. Ladona Ridgelaylor.   You will receive a reminder letter in the mail two months in advance. If you don't receive a letter, please call our office to schedule the follow-up appointment.  Remote monitoring is used to monitor your Pacemaker from home. This monitoring reduces the number of office visits required to check your device to one time per year. It allows us to keep an eye on the functioning of your device to ensure it is working properly. You are scheduled for a device check from home on 09/23/2018. You may send your transmission at any time that day. If you have a wireless device, the transmission will be sent automatically. After your physician reviews your transmission, you will receive a postcard with your next transmission date.  Any Other Special Instructions Will Be Listed Below (If Applicable).  If you need a refill on your cardiac medications before your next appointment, please call your pharmacy.

## 2018-07-15 NOTE — Progress Notes (Signed)
HPI Mr. Grabe today for ongoing evaluation and management of sinus bradycardia, status post pacemaker insertion, with a history of hypertension. Since I saw him last, he has been stable but admits to being sedentary. He thinks that his balance and breathing are not quite as good. He notes that his older sister just had heart surgery. He denies chest pain. His main complaint is that he is having to urinate 4-5 times every night and wonders if his lasix could be contributing.  No Known Allergies   Current Outpatient Medications  Medication Sig Dispense Refill  . acetaminophen (TYLENOL) 500 MG tablet 500 mg. Take 3 tablets by mouth every morning    . folic acid (FOLVITE) 1 MG tablet Take 1 tablet by mouth daily.    Marland Kitchen losartan-hydrochlorothiazide (HYZAAR) 100-25 MG tablet Take 1/2 tablet by mouth as needed    . methotrexate (50 MG/ML) 1 g injection Inject .8 ml once a week    . Omega-3 Fatty Acids (FISH OIL) 1000 MG CAPS Take 1,000 mg by mouth daily.    Marland Kitchen oxybutynin (DITROPAN) 5 MG tablet Take 2 tablets by mouth daily.    . simvastatin (ZOCOR) 20 MG tablet Take 20 mg by mouth at bedtime.     . tamsulosin (FLOMAX) 0.4 MG CAPS capsule Take 1 capsule by mouth daily.    Carlena Hurl 20 MG TABS tablet TAKE 1 TABLET BY MOUTH ONCE DAILY 90 tablet 2  . furosemide (LASIX) 20 MG tablet Take 1 tablet (20 mg total) by mouth daily as needed (edema or shortness of breath). 90 tablet 3   No current facility-administered medications for this visit.      Past Medical History:  Diagnosis Date  . Arthritis   . Dyslipidemia   . GERD (gastroesophageal reflux disease)   . HTN (hypertension)   . Paroxysmal atrial fibrillation (HCC)   . Symptomatic bradycardia    a. s/p MDT dual chamber PPM    ROS:   All systems reviewed and negative except as noted in the HPI.   Past Surgical History:  Procedure Laterality Date  . APPENDECTOMY  1946  . EP IMPLANTABLE DEVICE N/A 05/21/2015   Procedure:   PPM Generator Changeout;  Surgeon: Marinus Maw, MD;  Location: [facility];  Service: Cardiovascular;  Laterality: N/A;  . EYE SURGERY  02/01/2007,09/02/2007   left eye, right eye  . right knee cartilage removed  [id number]  . right shoulder  08/26/1990   arthroscopy  . S/P PPM (Medtronic Sigma DDD  12/03  . STERIOD INJECTION  12/11/2011   Procedure: STEROID INJECTION;  Surgeon: Loanne Drilling, MD;  Location: [facility];  Service: Orthopedics;  Laterality: Left;  . TONSILLECTOMY     age 29  . TOTAL KNEE ARTHROPLASTY  12/11/2011   Procedure: TOTAL KNEE ARTHROPLASTY;  Surgeon: Loanne Drilling, MD;  Location: [facility];  Service: Orthopedics;  Laterality: Right;  Marland Kitchen VASECTOMY  1968  . vocal cord growth   02/1999   removed growth on vocal cords     Family History  Problem Relation Age of Onset  . Coronary artery disease Neg Hx      Social History   Socioeconomic History  . Marital status: Married    Spouse name: Not on file  . Number of children: Not on file  . Years of education: Not on file  . Highest education level: Not on file  Occupational History  . Not on file  Social  Needs  . Financial resource strain: Not on file  . Food insecurity:    Worry: Not on file    Inability: Not on file  . Transportation needs:    Medical: Not on file    Non-medical: Not on file  Tobacco Use  . Smoking status: Former Smoker    Packs/day: 3.00    Years: 15.00    Pack years: 45.00    Types: Cigarettes    Last attempt to quit: 12/04/1963    Years since quitting: 54.6  . Smokeless tobacco: Never Used  Substance and Sexual Activity  . Alcohol use: No  . Drug use: No    Comment: Rare ETOH   . Sexual activity: Not on file  Lifestyle  . Physical activity:    Days per week: Not on file    Minutes per session: Not on file  . Stress: Not on file  Relationships  . Social connections:    Talks on phone: Not on file    Gets together: Not on file    Attends religious service: Not on file     Active member of club or organization: Not on file    Attends meetings of clubs or organizations: Not on file    Relationship status: Not on file  . Intimate partner violence:    Fear of current or ex partner: Not on file    Emotionally abused: Not on file    Physically abused: Not on file    Forced sexual activity: Not on file  Other Topics Concern  . Not on file  Social History Narrative  . Not on file     BP 138/68   Pulse 64   Ht 6\' 2"  (1.88 m)   Wt 232 lb (105.2 kg)   BMI 29.79 kg/m   Physical Exam:  Well appearing NAD HEENT: Unremarkable Neck:  No JVD, no thyromegally Lymphatics:  No adenopathy Back:  No CVA tenderness Lungs:  Clear with no wheezes HEART:  Regular rate rhythm, no murmurs, no rubs, no clicks Abd:  soft, positive bowel sounds, no organomegally, no rebound, no guarding Ext:  2 plus pulses, no edema, no cyanosis, no clubbing Skin:  No rashes no nodules Neuro:  CN II through XII intact, motor grossly intact  EKG - nsr with atrial pacing  DEVICE  Normal device function.  See PaceArt for details.   Assess/Plan: 1. Sinus node dysfunction - he is asymptomatic, s/p PPM. 2. PPM - his medtronic DDD PM is working normally. Mostly atrial pacing and ventricular sensing. 3. Nocturia - I asked him to stop his lasix and take it only as needed. I recommended he speak to Dr. Yetta FlockHodges and/or consider urology evaluation.  4. Diastolic heart failure - his symptoms are class 2A. He will continue his current meds. I have asked him to reduce his sodium intake.  Leonia ReevesGregg Queenie Aufiero,M.D.

## 2018-07-27 LAB — CUP PACEART INCLINIC DEVICE CHECK
Battery Remaining Longevity: 75 mo
Brady Statistic AP VP Percent: 0.1 %
Brady Statistic AP VS Percent: 91.43 %
Brady Statistic AS VP Percent: 0.02 %
Brady Statistic AS VS Percent: 8.45 %
Brady Statistic RA Percent Paced: 91.28 %
Brady Statistic RV Percent Paced: 0.13 %
Date Time Interrogation Session: 20191209213505
Implantable Lead Implant Date: 20031201
Implantable Lead Implant Date: 20031201
Implantable Lead Location: 753859
Implantable Lead Location: 753860
Implantable Lead Model: 5076
Implantable Lead Model: 5076
Implantable Pulse Generator Implant Date: 20161014
Lead Channel Impedance Value: 323 Ohm
Lead Channel Impedance Value: 361 Ohm
Lead Channel Impedance Value: 475 Ohm
Lead Channel Impedance Value: 513 Ohm
Lead Channel Pacing Threshold Amplitude: 0.625 V
Lead Channel Pacing Threshold Pulse Width: 0.4 ms
Lead Channel Pacing Threshold Pulse Width: 0.4 ms
Lead Channel Sensing Intrinsic Amplitude: 1 mV
Lead Channel Sensing Intrinsic Amplitude: 1.75 mV
Lead Channel Sensing Intrinsic Amplitude: 10.125 mV
Lead Channel Sensing Intrinsic Amplitude: 11.75 mV
Lead Channel Setting Pacing Amplitude: 2.5 V
Lead Channel Setting Pacing Amplitude: 2.75 V
Lead Channel Setting Pacing Pulse Width: 0.4 ms
Lead Channel Setting Sensing Sensitivity: 2.8 mV
MDC IDC MSMT BATTERY VOLTAGE: 3.01 V
MDC IDC MSMT LEADCHNL RA PACING THRESHOLD AMPLITUDE: 1.375 V

## 2018-08-20 LAB — CUP PACEART REMOTE DEVICE CHECK
Battery Remaining Longevity: 75 mo
Brady Statistic AP VP Percent: 0.09 %
Brady Statistic AP VS Percent: 91.12 %
Brady Statistic AS VP Percent: 0.02 %
Brady Statistic AS VS Percent: 8.77 %
Brady Statistic RA Percent Paced: 91.03 %
Brady Statistic RV Percent Paced: 0.13 %
Date Time Interrogation Session: 20191118160340
Implantable Lead Implant Date: 20031201
Implantable Lead Implant Date: 20031201
Implantable Lead Location: 753859
Implantable Lead Location: 753860
Implantable Lead Model: 5076
Implantable Lead Model: 5076
Implantable Pulse Generator Implant Date: 20161014
Lead Channel Impedance Value: 304 Ohm
Lead Channel Impedance Value: 361 Ohm
Lead Channel Impedance Value: 475 Ohm
Lead Channel Impedance Value: 532 Ohm
Lead Channel Pacing Threshold Amplitude: 0.75 V
Lead Channel Pacing Threshold Pulse Width: 0.4 ms
Lead Channel Pacing Threshold Pulse Width: 0.4 ms
Lead Channel Sensing Intrinsic Amplitude: 1 mV
Lead Channel Sensing Intrinsic Amplitude: 1 mV
Lead Channel Sensing Intrinsic Amplitude: 10.25 mV
Lead Channel Setting Pacing Amplitude: 2.5 V
Lead Channel Setting Pacing Amplitude: 2.5 V
Lead Channel Setting Pacing Pulse Width: 0.4 ms
Lead Channel Setting Sensing Sensitivity: 2.8 mV
MDC IDC MSMT BATTERY VOLTAGE: 3.01 V
MDC IDC MSMT LEADCHNL RA PACING THRESHOLD AMPLITUDE: 1.25 V
MDC IDC MSMT LEADCHNL RV SENSING INTR AMPL: 10.25 mV

## 2018-09-23 ENCOUNTER — Ambulatory Visit (INDEPENDENT_AMBULATORY_CARE_PROVIDER_SITE_OTHER): Payer: Medicare Other

## 2018-09-23 DIAGNOSIS — I495 Sick sinus syndrome: Secondary | ICD-10-CM | POA: Diagnosis not present

## 2018-09-23 LAB — CUP PACEART REMOTE DEVICE CHECK
Battery Remaining Longevity: 74 mo
Battery Voltage: 3.01 V
Brady Statistic AP VP Percent: 0.08 %
Brady Statistic AP VS Percent: 87.74 %
Brady Statistic AS VP Percent: 0.02 %
Brady Statistic AS VS Percent: 12.16 %
Brady Statistic RA Percent Paced: 87.53 %
Brady Statistic RV Percent Paced: 0.1 %
Date Time Interrogation Session: 20200217195044
Implantable Lead Implant Date: 20031201
Implantable Lead Location: 753859
Implantable Lead Location: 753860
Implantable Lead Model: 5076
Implantable Lead Model: 5076
Implantable Pulse Generator Implant Date: 20161014
Lead Channel Impedance Value: 304 Ohm
Lead Channel Impedance Value: 361 Ohm
Lead Channel Impedance Value: 437 Ohm
Lead Channel Impedance Value: 494 Ohm
Lead Channel Pacing Threshold Amplitude: 0.625 V
Lead Channel Pacing Threshold Pulse Width: 0.4 ms
Lead Channel Sensing Intrinsic Amplitude: 0.875 mV
Lead Channel Sensing Intrinsic Amplitude: 0.875 mV
Lead Channel Setting Pacing Amplitude: 2.5 V
Lead Channel Setting Pacing Amplitude: 2.5 V
Lead Channel Setting Pacing Pulse Width: 0.4 ms
Lead Channel Setting Sensing Sensitivity: 2.8 mV
MDC IDC LEAD IMPLANT DT: 20031201
MDC IDC MSMT LEADCHNL RA PACING THRESHOLD AMPLITUDE: 1.25 V
MDC IDC MSMT LEADCHNL RA PACING THRESHOLD PULSEWIDTH: 0.4 ms
MDC IDC MSMT LEADCHNL RV SENSING INTR AMPL: 10.125 mV
MDC IDC MSMT LEADCHNL RV SENSING INTR AMPL: 10.125 mV

## 2018-09-24 DIAGNOSIS — H5461 Unqualified visual loss, right eye, normal vision left eye: Secondary | ICD-10-CM

## 2018-09-24 DIAGNOSIS — I639 Cerebral infarction, unspecified: Secondary | ICD-10-CM

## 2018-09-24 DIAGNOSIS — I340 Nonrheumatic mitral (valve) insufficiency: Secondary | ICD-10-CM

## 2018-09-25 DIAGNOSIS — H5461 Unqualified visual loss, right eye, normal vision left eye: Secondary | ICD-10-CM | POA: Diagnosis not present

## 2018-09-25 DIAGNOSIS — I639 Cerebral infarction, unspecified: Secondary | ICD-10-CM | POA: Diagnosis not present

## 2018-10-02 NOTE — Progress Notes (Signed)
Remote pacemaker transmission.   

## 2018-12-23 ENCOUNTER — Ambulatory Visit (INDEPENDENT_AMBULATORY_CARE_PROVIDER_SITE_OTHER): Payer: Medicare Other | Admitting: *Deleted

## 2018-12-23 ENCOUNTER — Other Ambulatory Visit: Payer: Self-pay

## 2018-12-23 DIAGNOSIS — I495 Sick sinus syndrome: Secondary | ICD-10-CM

## 2018-12-24 LAB — CUP PACEART REMOTE DEVICE CHECK
Battery Remaining Longevity: 68 mo
Battery Voltage: 3.01 V
Brady Statistic AP VP Percent: 0.09 %
Brady Statistic AP VS Percent: 90.71 %
Brady Statistic AS VP Percent: 0.02 %
Brady Statistic AS VS Percent: 9.17 %
Brady Statistic RA Percent Paced: 90.59 %
Brady Statistic RV Percent Paced: 0.13 %
Date Time Interrogation Session: 20200518143310
Implantable Lead Implant Date: 20031201
Implantable Lead Implant Date: 20031201
Implantable Lead Location: 753859
Implantable Lead Location: 753860
Implantable Lead Model: 5076
Implantable Lead Model: 5076
Implantable Pulse Generator Implant Date: 20161014
Lead Channel Impedance Value: 304 Ohm
Lead Channel Impedance Value: 361 Ohm
Lead Channel Impedance Value: 494 Ohm
Lead Channel Impedance Value: 532 Ohm
Lead Channel Pacing Threshold Amplitude: 0.75 V
Lead Channel Pacing Threshold Amplitude: 1.25 V
Lead Channel Pacing Threshold Pulse Width: 0.4 ms
Lead Channel Pacing Threshold Pulse Width: 0.4 ms
Lead Channel Sensing Intrinsic Amplitude: 0.875 mV
Lead Channel Sensing Intrinsic Amplitude: 0.875 mV
Lead Channel Sensing Intrinsic Amplitude: 11.125 mV
Lead Channel Sensing Intrinsic Amplitude: 11.125 mV
Lead Channel Setting Pacing Amplitude: 2.5 V
Lead Channel Setting Pacing Amplitude: 2.5 V
Lead Channel Setting Pacing Pulse Width: 0.4 ms
Lead Channel Setting Sensing Sensitivity: 2.8 mV

## 2019-01-01 ENCOUNTER — Encounter: Payer: Self-pay | Admitting: Cardiology

## 2019-01-01 NOTE — Progress Notes (Signed)
Remote pacemaker transmission.   

## 2019-01-09 ENCOUNTER — Encounter: Payer: Medicare Other | Admitting: Internal Medicine

## 2019-01-17 ENCOUNTER — Other Ambulatory Visit: Payer: Self-pay | Admitting: Internal Medicine

## 2019-01-17 NOTE — Telephone Encounter (Signed)
Pt last saw Dr Lovena Le 07/15/18, last labs 12/25/18 Creat 1.05, age 85, weight 105.2kg, CrCl 76.53, based on CrCl pt is on appropriate dosage of Xarelto 20mg  QD.  Will refill rx.

## 2019-02-21 ENCOUNTER — Other Ambulatory Visit: Payer: Self-pay

## 2019-02-21 DIAGNOSIS — Z20822 Contact with and (suspected) exposure to covid-19: Secondary | ICD-10-CM

## 2019-02-26 LAB — NOVEL CORONAVIRUS, NAA: SARS-CoV-2, NAA: NOT DETECTED

## 2019-03-24 ENCOUNTER — Ambulatory Visit (INDEPENDENT_AMBULATORY_CARE_PROVIDER_SITE_OTHER): Payer: Medicare Other | Admitting: *Deleted

## 2019-03-24 DIAGNOSIS — I495 Sick sinus syndrome: Secondary | ICD-10-CM | POA: Diagnosis not present

## 2019-03-24 LAB — CUP PACEART REMOTE DEVICE CHECK
Battery Remaining Longevity: 63 mo
Battery Voltage: 3 V
Brady Statistic AP VP Percent: 0.12 %
Brady Statistic AP VS Percent: 93.37 %
Brady Statistic AS VP Percent: 0.03 %
Brady Statistic AS VS Percent: 6.48 %
Brady Statistic RA Percent Paced: 93.17 %
Brady Statistic RV Percent Paced: 0.18 %
Date Time Interrogation Session: 20200817135719
Implantable Lead Implant Date: 20031201
Implantable Lead Implant Date: 20031201
Implantable Lead Location: 753859
Implantable Lead Location: 753860
Implantable Lead Model: 5076
Implantable Lead Model: 5076
Implantable Pulse Generator Implant Date: 20161014
Lead Channel Impedance Value: 304 Ohm
Lead Channel Impedance Value: 361 Ohm
Lead Channel Impedance Value: 494 Ohm
Lead Channel Impedance Value: 532 Ohm
Lead Channel Pacing Threshold Amplitude: 0.875 V
Lead Channel Pacing Threshold Amplitude: 1.375 V
Lead Channel Pacing Threshold Pulse Width: 0.4 ms
Lead Channel Pacing Threshold Pulse Width: 0.4 ms
Lead Channel Sensing Intrinsic Amplitude: 0.875 mV
Lead Channel Sensing Intrinsic Amplitude: 0.875 mV
Lead Channel Sensing Intrinsic Amplitude: 10.875 mV
Lead Channel Sensing Intrinsic Amplitude: 10.875 mV
Lead Channel Setting Pacing Amplitude: 2.5 V
Lead Channel Setting Pacing Amplitude: 2.75 V
Lead Channel Setting Pacing Pulse Width: 0.4 ms
Lead Channel Setting Sensing Sensitivity: 2.8 mV

## 2019-03-25 ENCOUNTER — Telehealth: Payer: Self-pay | Admitting: Emergency Medicine

## 2019-03-25 NOTE — Telephone Encounter (Signed)
Spoke with patient concerning transmission in [facility]. Report showed 8 VT episodes, 2 appear to be SVT with rates from 190-200 bpm. Other episodes appear to be NSVT. Longest episode was 22 beats long @ 160 bpm.   Patient reports SOB when walking to mail box. No CP, chest pressure or palpataions. + Xarelto. Patient is scheduled for appointment with DR Lovena Le 04/03/19. Emphasized importance of keeping appointment and ED precautions reviewed.

## 2019-03-30 NOTE — Telephone Encounter (Signed)
followup as scheduled.  GT 

## 2019-04-02 ENCOUNTER — Encounter: Payer: Self-pay | Admitting: Cardiology

## 2019-04-02 NOTE — Progress Notes (Signed)
Remote pacemaker transmission.   

## 2019-04-03 ENCOUNTER — Ambulatory Visit (INDEPENDENT_AMBULATORY_CARE_PROVIDER_SITE_OTHER): Payer: Medicare Other | Admitting: Internal Medicine

## 2019-04-03 ENCOUNTER — Encounter: Payer: Self-pay | Admitting: Internal Medicine

## 2019-04-03 ENCOUNTER — Other Ambulatory Visit: Payer: Self-pay

## 2019-04-03 VITALS — BP 118/70 | HR 61 | Ht 74.0 in | Wt 217.2 lb

## 2019-04-03 DIAGNOSIS — I495 Sick sinus syndrome: Secondary | ICD-10-CM | POA: Diagnosis not present

## 2019-04-03 DIAGNOSIS — Z950 Presence of cardiac pacemaker: Secondary | ICD-10-CM

## 2019-04-03 DIAGNOSIS — I480 Paroxysmal atrial fibrillation: Secondary | ICD-10-CM

## 2019-04-03 DIAGNOSIS — I10 Essential (primary) hypertension: Secondary | ICD-10-CM | POA: Diagnosis not present

## 2019-04-03 DIAGNOSIS — R0600 Dyspnea, unspecified: Secondary | ICD-10-CM

## 2019-04-03 DIAGNOSIS — E785 Hyperlipidemia, unspecified: Secondary | ICD-10-CM

## 2019-04-03 MED ORDER — FUROSEMIDE 20 MG PO TABS: 20.0000 mg | ORAL_TABLET | Freq: Every day | ORAL | 3 refills | Status: DC

## 2019-04-03 NOTE — Patient Instructions (Addendum)
Medication Instructions:  Your physician has recommended you make the following change in your medication:   1.  Start taking Lasix (furosemide) 20 mg one tablet by mouth daily  Labwork: You will need a BMP-please schedule BMP on same day as ECHO  Testing/Procedures: Your physician has requested that you have an echocardiogram. Echocardiography is a painless test that uses sound waves to create images of your heart. It provides your doctor with information about the size and shape of your heart and how well your heart's chambers and valves are working. This procedure takes approximately one hour. There are no restrictions for this procedure.  Please schedule for ECHO  Follow-Up: Your physician wants you to follow-up in: 6 weeks with Dr. Lovena Le.     Remote monitoring is used to monitor your Pacemaker from home. This monitoring reduces the number of office visits required to check your device to one time per year. It allows [place] to keep an eye on the functioning of your device to ensure it is working properly. You are scheduled for a device check from home on 06/23/2019. You may send your transmission at any time that day. If you have a wireless device, the transmission will be sent automatically. After your physician reviews your transmission, you will receive a postcard with your next transmission date.  Any Other Special Instructions Will Be Listed Below (If Applicable).  If you need a refill on your cardiac medications before your next appointment, please call your pharmacy.   Furosemide tablets What is this medicine? FUROSEMIDE (fyoor [place] se mide) is a diuretic. It helps you make more urine and to lose salt and excess water from your body. This medicine is used to treat high blood pressure, and edema or swelling from heart, kidney, or liver disease. This medicine may be used for other purposes; ask your health care provider or pharmacist if you have questions. COMMON BRAND NAME(S):  Active-Medicated Specimen Kit, Delone, Diuscreen, Lasix, RX Specimen Collection Kit, Specimen Collection Kit, URINX Medicated Specimen Collection What should I tell my health care provider before I take this medicine? They need to know if you have any of these conditions:  abnormal blood electrolytes  diarrhea or vomiting  gout  heart disease  kidney disease, small amounts of urine, or difficulty passing urine  liver disease  thyroid disease  an unusual or allergic reaction to furosemide, sulfa drugs, other medicines, foods, dyes, or preservatives  pregnant or trying to get pregnant  breast-feeding How should I use this medicine? Take this medicine by mouth with a glass of water. Follow the directions on the prescription label. You may take this medicine with or without food. If it upsets your stomach, take it with food or milk. Do not take your medicine more often than directed. Remember that you will need to pass more urine after taking this medicine. Do not take your medicine at a time of day that will cause you problems. Do not take at bedtime. Talk to your pediatrician regarding the use of this medicine in children. While this drug may be prescribed for selected conditions, precautions do apply. Overdosage: If you think you have taken too much of this medicine contact a poison control center or emergency room at once. NOTE: This medicine is only for you. Do not share this medicine with others. What if I miss a dose? If you miss a dose, take it as soon as you can. If it is almost time for your next dose, take only that  dose. Do not take double or extra doses. What may interact with this medicine?  aspirin and aspirin-like medicines  certain antibiotics  chloral hydrate  cisplatin  cyclosporine  digoxin  diuretics  laxatives  lithium  medicines for blood pressure  medicines that relax muscles for surgery  methotrexate  NSAIDs, medicines for pain and  inflammation like ibuprofen, naproxen, or indomethacin  phenytoin  steroid medicines like prednisone or cortisone  sucralfate  thyroid hormones This list may not describe all possible interactions. Give your health care provider a list of all the medicines, herbs, non-prescription drugs, or dietary supplements you use. Also tell them if you smoke, drink alcohol, or use illegal drugs. Some items may interact with your medicine. What should I watch for while using this medicine? Visit your doctor or health care provider for regular checks on your progress. Check your blood pressure regularly. Ask your doctor or health care provider what your blood pressure should be, and when you should contact him or her. If you are a diabetic, check your blood sugar as directed. This medicine may cause serious skin reactions. They can happen weeks to months after starting the medicine. Contact your health care provider right away if you notice fevers or flu-like symptoms with a rash. The rash may be red or purple and then turn into blisters or peeling of the skin. Or, you might notice a red rash with swelling of the face, lips or lymph nodes in your neck or under your arms. You may need to be on a special diet while taking this medicine. Check with your doctor. Also, ask how many glasses of fluid you need to drink a day. You must not get dehydrated. You may get drowsy or dizzy. Do not drive, use machinery, or do anything that needs mental alertness until you know how this drug affects you. Do not stand or sit up quickly, especially if you are an older patient. This reduces the risk of dizzy or fainting spells. Alcohol can make you more drowsy and dizzy. Avoid alcoholic drinks. This medicine can make you more sensitive to the sun. Keep out of the sun. If you cannot avoid being in the sun, wear protective clothing and use sunscreen. Do not use sun lamps or tanning beds/booths. What side effects may I notice from  receiving this medicine? Side effects that you should report to your doctor or health care professional as soon as possible:  blood in urine or stools  dry mouth  fever or chills  hearing loss or ringing in the ears  irregular heartbeat  muscle pain or weakness, cramps  rash, fever, and swollen lymph nodes  redness, blistering, peeling or loosening of the skin, including inside the mouth  skin rash  stomach upset, pain, or nausea  tingling or numbness in the hands or feet  unusually weak or tired  vomiting or diarrhea  yellowing of the eyes or skin Side effects that usually do not require medical attention (report to your doctor or health care professional if they continue or are bothersome):  headache  loss of appetite  unusual bleeding or bruising This list may not describe all possible side effects. Call your doctor for medical advice about side effects. You may report side effects to FDA at 1-800-FDA-1088. Where should I keep my medicine? Keep out of the reach of children. Store at room temperature between 15 and 30 degrees C (59 and 86 degrees F). Protect from light. Throw away any unused medicine after the  expiration date. NOTE: This sheet is a summary. It may not cover all possible information. If you have questions about this medicine, talk to your doctor, pharmacist, or health care provider.  2020 Elsevier/Gold Standard (2018-10-25 14:04:13)

## 2019-04-03 NOTE — Progress Notes (Signed)
HPI [name] returns today for ongoing evaluation. The patient is a pleasant 85 yo man with HTN, sinus node dysfunction, and recent stroke. The patient was seen by me several months ago. He has noted sob, which is now class 3. The patient denies chest pain. No edema. He was taking lasix but this was stopped. He has been on HCTZ. He has not had syncope.  No Known Allergies   Current Outpatient Medications  Medication Sig Dispense Refill  . acetaminophen (TYLENOL) 500 MG tablet 500 mg. Take 3 tablets by mouth every morning    . folic acid (FOLVITE) 1 MG tablet Take 1 tablet by mouth daily.    . hydrochlorothiazide (HYDRODIURIL) 25 MG tablet Take 25 mg by mouth daily.    Marland Kitchen losartan (COZAAR) 100 MG tablet Take 100 mg by mouth as needed.    . methotrexate (50 MG/ML) 1 g injection Inject .8 ml once a week    . Omega-3 Fatty Acids (FISH OIL) 1000 MG CAPS Take 1,000 mg by mouth daily.    . simvastatin (ZOCOR) 20 MG tablet Take 20 mg by mouth at bedtime.     . tamsulosin (FLOMAX) 0.4 MG CAPS capsule Take 1 capsule by mouth daily.    Alveda Reasons 20 MG TABS tablet Take 1 tablet by mouth once daily 90 tablet 1  . furosemide (LASIX) 20 MG tablet Take 1 tablet (20 mg total) by mouth daily. 90 tablet 3   No current facility-administered medications for this visit.      Past Medical History:  Diagnosis Date  . Arthritis   . Dyslipidemia   . GERD (gastroesophageal reflux disease)   . HTN (hypertension)   . Paroxysmal atrial fibrillation (HCC)   . Symptomatic bradycardia    a. s/p MDT dual chamber PPM    ROS:   All systems reviewed and negative except as noted in the HPI.   Past Surgical History:  Procedure Laterality Date  . APPENDECTOMY  1946  . EP IMPLANTABLE DEVICE N/A 05/21/2015   Procedure:  PPM Generator Changeout;  Surgeon: Evans Lance, MD;  Location: [facility];  Service: Cardiovascular;  Laterality: N/A;  . EYE SURGERY  02/01/2007,09/02/2007   left eye, right  eye  . right knee cartilage removed  [id number]  . right shoulder  08/26/1990   arthroscopy  . S/P PPM (Medtronic Sigma DDD  12/03  . STERIOD INJECTION  12/11/2011   Procedure: STEROID INJECTION;  Surgeon: Gearlean Alf, MD;  Location: [facility];  Service: Orthopedics;  Laterality: Left;  . TONSILLECTOMY     age 67  . TOTAL KNEE ARTHROPLASTY  12/11/2011   Procedure: TOTAL KNEE ARTHROPLASTY;  Surgeon: Gearlean Alf, MD;  Location: [facility];  Service: Orthopedics;  Laterality: Right;  Marland Kitchen VASECTOMY  1968  . vocal cord growth   02/1999   removed growth on vocal cords     Family History  Problem Relation Age of Onset  . Coronary artery disease Neg Hx      Social History   Socioeconomic History  . Marital status: Married    Spouse name: Not on file  . Number of children: Not on file  . Years of education: Not on file  . Highest education level: Not on file  Occupational History  . Not on file  Social Needs  . Financial resource strain: Not on file  . Food insecurity    Worry: Not on file  Inability: Not on file  . Transportation needs    Medical: Not on file    Non-medical: Not on file  Tobacco Use  . Smoking status: Former Smoker    Packs/day: 3.00    Years: 15.00    Pack years: 45.00    Types: Cigarettes    Quit date: 12/04/1963    Years since quitting: 55.3  . Smokeless tobacco: Never Used  Substance and Sexual Activity  . Alcohol use: No  . Drug use: No    Comment: Rare ETOH   . Sexual activity: Not on file  Lifestyle  . Physical activity    Days per week: Not on file    Minutes per session: Not on file  . Stress: Not on file  Relationships  . Social Musicianconnections    Talks on phone: Not on file    Gets together: Not on file    Attends religious service: Not on file    Active member of club or organization: Not on file    Attends meetings of clubs or organizations: Not on file    Relationship status: Not on file  . Intimate partner violence    Fear of current  or ex partner: Not on file    Emotionally abused: Not on file    Physically abused: Not on file    Forced sexual activity: Not on file  Other Topics Concern  . Not on file  Social History Narrative  . Not on file     BP 118/70   Pulse 61   Ht 6\' 2"  (1.88 m)   Wt 217 lb 3.2 oz (98.5 kg)   SpO2 96%   BMI 27.89 kg/m   Physical Exam:  Well appearing elderly man, NAD HEENT: Unremarkable Neck:  6 cm JVD, no thyromegally Lymphatics:  No adenopathy Back:  No CVA tenderness Lungs:  Clear except for rales in the bases HEART:  Regular rate rhythm, no murmurs, no rubs, no clicks Abd:  soft, positive bowel sounds, no organomegally, no rebound, no guarding Ext:  2 plus pulses, no edema, no cyanosis, no clubbing Skin:  No rashes no nodules Neuro:  CN II through XII intact, motor grossly intact  DEVICE  Normal device function.  See PaceArt for details.   Assess/Plan: 1. Dyspnea - This is his main problem today. He will start lasix 20 mg daily with plans to uptitrate. He will undergo a 2D echo. Additional rec's will be based on the results of the echo and how he responds to diuretic therapy.  2. PAF - he has tolerated xarelto. He does not have palpitations. 3. Stroke - he has residual vision problems and is not driving. 4. HTN - His blood pressure is well controlled. He will continue his current meds.  Leonia ReevesGregg Taylor,M.D.

## 2019-04-16 ENCOUNTER — Other Ambulatory Visit: Payer: Medicare Other | Admitting: *Deleted

## 2019-04-16 ENCOUNTER — Other Ambulatory Visit: Payer: Self-pay

## 2019-04-16 ENCOUNTER — Ambulatory Visit (HOSPITAL_COMMUNITY): Payer: Medicare Other | Attending: Cardiology

## 2019-04-16 DIAGNOSIS — R0600 Dyspnea, unspecified: Secondary | ICD-10-CM

## 2019-04-17 LAB — BASIC METABOLIC PANEL
BUN/Creatinine Ratio: 13 (ref 10–24)
BUN: 14 mg/dL (ref 8–27)
CO2: 25 mmol/L (ref 20–29)
Calcium: 9.4 mg/dL (ref 8.6–10.2)
Chloride: 109 mmol/L — ABNORMAL HIGH (ref 96–106)
Creatinine, Ser: 1.04 mg/dL (ref 0.76–1.27)
GFR calc Af Amer: 75 mL/min/{1.73_m2} (ref >59)
GFR calc non Af Amer: 65 mL/min/{1.73_m2} (ref >59)
Glucose: 94 mg/dL (ref 65–99)
Potassium: 4.4 mmol/L (ref 3.5–5.2)
Sodium: 146 mmol/L — ABNORMAL HIGH (ref 134–144)

## 2019-04-23 ENCOUNTER — Telehealth: Payer: Self-pay | Admitting: Internal Medicine

## 2019-04-23 MED ORDER — FUROSEMIDE 40 MG PO TABS: 40.0000 mg | ORAL_TABLET | Freq: Every day | ORAL | 3 refills | Status: DC

## 2019-04-23 NOTE — Telephone Encounter (Signed)
Returned call to Pt's wife.   Advised to take lasix 40 mg in the morning, do not split into BID.  Wife indicates understanding.

## 2019-04-23 NOTE — Telephone Encounter (Signed)
New message   Pt c/o medication issue:  1. Name of Medication: furosemide (LASIX) 20 MG tablet  2. How are you currently taking this medication (dosage and times per day)? Twice daily  3. Are you having a reaction (difficulty breathing--STAT)? No   4. What is your medication issue? Patient's wife wants to know if he can take both tablets at one time in the morning. Please advise.

## 2019-05-27 ENCOUNTER — Ambulatory Visit (INDEPENDENT_AMBULATORY_CARE_PROVIDER_SITE_OTHER): Payer: Medicare Other | Admitting: Internal Medicine

## 2019-05-27 ENCOUNTER — Encounter: Payer: Self-pay | Admitting: Internal Medicine

## 2019-05-27 ENCOUNTER — Other Ambulatory Visit: Payer: Self-pay

## 2019-05-27 VITALS — BP 124/68 | HR 73 | Ht 74.0 in | Wt 214.0 lb

## 2019-05-27 DIAGNOSIS — I480 Paroxysmal atrial fibrillation: Secondary | ICD-10-CM

## 2019-05-27 DIAGNOSIS — I495 Sick sinus syndrome: Secondary | ICD-10-CM | POA: Diagnosis not present

## 2019-05-27 DIAGNOSIS — Z950 Presence of cardiac pacemaker: Secondary | ICD-10-CM | POA: Diagnosis not present

## 2019-05-27 DIAGNOSIS — I10 Essential (primary) hypertension: Secondary | ICD-10-CM

## 2019-05-27 DIAGNOSIS — R0600 Dyspnea, unspecified: Secondary | ICD-10-CM | POA: Diagnosis not present

## 2019-05-27 NOTE — Patient Instructions (Signed)
Medication Instructions:  Your physician recommends that you continue on your current medications as directed. Please refer to the Current Medication list given to you today.  Labwork: None ordered.  Testing/Procedures: None ordered.  Follow-Up: Your physician wants you to follow-up in: 6 months with Dr. Lovena Le.   You will receive a reminder letter in the mail two months in advance. If you don't receive a letter, please call our office to schedule the follow-up appointment.  Remote monitoring is used to monitor your Pacemaker from home. This monitoring reduces the number of office visits required to check your device to one time per year. It allows [place] to keep an eye on the functioning of your device to ensure it is working properly. You are scheduled for a device check from home on 06/23/2019. You may send your transmission at any time that day. If you have a wireless device, the transmission will be sent automatically. After your physician reviews your transmission, you will receive a postcard with your next transmission date.  Any Other Special Instructions Will Be Listed Below (If Applicable).  If you need a refill on your cardiac medications before your next appointment, please call your pharmacy.

## 2019-05-27 NOTE — Progress Notes (Signed)
HPI [name] returns today for followup of sob. He is a pleasant 85 yo man with HTN and sinus node dysfunction who underwent PPM insertion years ago. He has had worsening dyspnea with exertion. He had a 2D echo in the interim. He has preserved LV function with no significant valvular abnormalities noted. He was placed on lasix 40 mg daily but does not think it helped much. He has a remote h/o tobacco use but stopped smoking 50 years ago. He has not had syncope.   No Known Allergies   Current Outpatient Medications  Medication Sig Dispense Refill  . acetaminophen (TYLENOL) 500 MG tablet 500 mg. Take 3 tablets by mouth every morning    . folic acid (FOLVITE) 1 MG tablet Take 1 tablet by mouth daily.    . furosemide (LASIX) 40 MG tablet Take 1 tablet (40 mg total) by mouth daily. 90 tablet 3  . hydrochlorothiazide (HYDRODIURIL) 25 MG tablet Take 25 mg by mouth daily.    Marland Kitchen losartan (COZAAR) 100 MG tablet Take 100 mg by mouth as needed.    . methotrexate (50 MG/ML) 1 g injection Inject .8 ml once a week    . Omega-3 Fatty Acids (FISH OIL) 1000 MG CAPS Take 1,000 mg by mouth daily.    . simvastatin (ZOCOR) 20 MG tablet Take 20 mg by mouth at bedtime.     . tamsulosin (FLOMAX) 0.4 MG CAPS capsule Take 1 capsule by mouth daily.    . trazodone (DESYREL) 300 MG tablet Take 1 tablet by mouth daily.    Carlena Hurl 20 MG TABS tablet Take 1 tablet by mouth once daily 90 tablet 1   No current facility-administered medications for this visit.      Past Medical History:  Diagnosis Date  . Arthritis   . Dyslipidemia   . GERD (gastroesophageal reflux disease)   . HTN (hypertension)   . Paroxysmal atrial fibrillation (HCC)   . Symptomatic bradycardia    a. s/p MDT dual chamber PPM    ROS:   All systems reviewed and negative except as noted in the HPI.   Past Surgical History:  Procedure Laterality Date  . APPENDECTOMY  1946  . EP IMPLANTABLE DEVICE N/A 05/21/2015   Procedure:  PPM  Generator Changeout;  Surgeon: Marinus Maw, MD;  Location: [facility] INVASIVE CV LAB;  Service: Cardiovascular;  Laterality: N/A;  . EYE SURGERY  02/01/2007,09/02/2007   left eye, right eye  . right knee cartilage removed  0315,9458  . right shoulder  08/26/1990   arthroscopy  . S/P PPM ([employer] Sigma DDD  12/03  . STERIOD INJECTION  12/11/2011   Procedure: STEROID INJECTION;  Surgeon: Loanne Drilling, MD;  Location: [facility] OR[facility];  Service: Orthopedics;  Laterality: Left;  . TONSILLECTOMY     age 74  . TOTAL KNEE ARTHROPLASTY  12/11/2011   Procedure: TOTAL KNEE ARTHROPLASTY;  Surgeon: Loanne Drilling, MD;  Location: [facility];  Service: Orthopedics;  Laterality: Right;  Marland Kitchen VASECTOMY  1968  . vocal cord growth   02/1999   removed growth on vocal cords     Family History  Problem Relation Age of Onset  . Coronary artery disease Neg Hx      Social History   Socioeconomic History  . Marital status: Married    Spouse name: Not on file  . Number of children: Not on file  . Years of education: Not on file  . Highest education level:  Not on file  Occupational History  . Not on file  Social Needs  . Financial resource strain: Not on file  . Food insecurity    Worry: Not on file    Inability: Not on file  . Transportation needs    Medical: Not on file    Non-medical: Not on file  Tobacco Use  . Smoking status: Former Smoker    Packs/day: 3.00    Years: 15.00    Pack years: 45.00    Types: Cigarettes    Quit date: 12/04/1963    Years since quitting: 55.5  . Smokeless tobacco: Never Used  Substance and Sexual Activity  . Alcohol use: No  . Drug use: No    Comment: Rare ETOH   . Sexual activity: Not on file  Lifestyle  . Physical activity    Days per week: Not on file    Minutes per session: Not on file  . Stress: Not on file  Relationships  . Social Herbalist on phone: Not on file    Gets together: Not on file    Attends religious service: Not on file    Active member  of club or organization: Not on file    Attends meetings of clubs or organizations: Not on file    Relationship status: Not on file  . Intimate partner violence    Fear of current or ex partner: Not on file    Emotionally abused: Not on file    Physically abused: Not on file    Forced sexual activity: Not on file  Other Topics Concern  . Not on file  Social History Narrative  . Not on file     BP 124/68   Pulse 73   Ht 6\' 2"  (1.88 m)   Wt 214 lb (97.1 kg)   SpO2 97%   BMI 27.48 kg/m   Physical Exam:  Well appearing NAD HEENT: Unremarkable Neck:  No JVD, no thyromegally Lymphatics:  No adenopathy Back:  No CVA tenderness Lungs:  Clear with no wheezes HEART:  Regular rate rhythm, no murmurs, no rubs, no clicks Abd:  soft, positive bowel sounds, no organomegally, no rebound, no guarding Ext:  2 plus pulses, no edema, no cyanosis, no clubbing Skin:  No rashes no nodules Neuro:  CN II through XII intact, motor grossly intact  EKG - NSR with atrial pacing  DEVICE  Normal device function.  See PaceArt for details.  Echo - reviewed  Assess/Plan: 1. Dyspnea - I suspect that this is multifactorial. We discussed his diastolic heart failure and I asked him to maintain a low sodium diet. He is not wheezing today. He will continue the lasix for now. 2. PAF - he is maintaining NSR. He will continue his anti-coagulation. 3. HTN - his blood pressure is well controlled. 4. PPM - today we turned up the rate response feature to increase chronotropic response to physical activity as it appear blunted.  Mikle Bosworth.D.

## 2019-05-29 ENCOUNTER — Other Ambulatory Visit: Payer: Self-pay

## 2019-05-29 DIAGNOSIS — Z20822 Contact with and (suspected) exposure to covid-19: Secondary | ICD-10-CM

## 2019-05-31 LAB — NOVEL CORONAVIRUS, NAA: SARS-CoV-2, NAA: NOT DETECTED

## 2019-06-23 ENCOUNTER — Telehealth: Payer: Self-pay

## 2019-06-23 ENCOUNTER — Encounter: Payer: Medicare Other | Admitting: *Deleted

## 2019-06-23 NOTE — Telephone Encounter (Signed)
Pt wife states the pt monitor received the 3230 error code. I gave her the number to [place] support to get additional help.

## 2019-06-26 ENCOUNTER — Telehealth: Payer: Self-pay

## 2019-06-26 NOTE — Telephone Encounter (Signed)
Pt wife states she is trying to send a transmission with the pt monitor but is still receiving the 3230 error code. She is calling [employer] to get the monitor fixed today.

## 2019-06-30 ENCOUNTER — Ambulatory Visit (INDEPENDENT_AMBULATORY_CARE_PROVIDER_SITE_OTHER): Payer: Medicare Other | Admitting: *Deleted

## 2019-06-30 ENCOUNTER — Telehealth: Payer: Self-pay

## 2019-06-30 DIAGNOSIS — Z950 Presence of cardiac pacemaker: Secondary | ICD-10-CM

## 2019-06-30 LAB — CUP PACEART REMOTE DEVICE CHECK
Battery Remaining Longevity: 59 mo
Battery Voltage: 2.99 V
Brady Statistic AP VP Percent: 0.17 %
Brady Statistic AP VS Percent: 97.25 %
Brady Statistic AS VP Percent: 0.01 %
Brady Statistic AS VS Percent: 2.57 %
Brady Statistic RA Percent Paced: 97.21 %
Brady Statistic RV Percent Paced: 0.2 %
Date Time Interrogation Session: 20201123140404
Implantable Lead Implant Date: 20031201
Implantable Lead Implant Date: 20031201
Implantable Lead Location: 753859
Implantable Lead Location: 753860
Implantable Lead Model: 5076
Implantable Lead Model: 5076
Implantable Pulse Generator Implant Date: 20161014
Lead Channel Impedance Value: 304 Ohm
Lead Channel Impedance Value: 361 Ohm
Lead Channel Impedance Value: 456 Ohm
Lead Channel Impedance Value: 513 Ohm
Lead Channel Pacing Threshold Amplitude: 0.625 V
Lead Channel Pacing Threshold Amplitude: 1.375 V
Lead Channel Pacing Threshold Pulse Width: 0.4 ms
Lead Channel Pacing Threshold Pulse Width: 0.4 ms
Lead Channel Sensing Intrinsic Amplitude: 0.875 mV
Lead Channel Sensing Intrinsic Amplitude: 0.875 mV
Lead Channel Sensing Intrinsic Amplitude: 8.75 mV
Lead Channel Sensing Intrinsic Amplitude: 8.75 mV
Lead Channel Setting Pacing Amplitude: 2.5 V
Lead Channel Setting Pacing Amplitude: 2.75 V
Lead Channel Setting Pacing Pulse Width: 0.4 ms
Lead Channel Setting Sensing Sensitivity: 2.8 mV

## 2019-06-30 NOTE — Telephone Encounter (Signed)
Transmission received.

## 2019-07-15 ENCOUNTER — Other Ambulatory Visit: Payer: Self-pay | Admitting: Internal Medicine

## 2019-07-15 NOTE — Telephone Encounter (Signed)
Prescription refill request for Xarelto received.   Last office visit: 05/27/2019, Maurice Hall Weight: 97.1kg Age: 85 y.o. Scr: 1.04, 04/16/2019 CrCl: 70.02 ml/min   Prescription refill sent.

## 2019-07-18 DIAGNOSIS — I509 Heart failure, unspecified: Secondary | ICD-10-CM | POA: Insufficient documentation

## 2019-07-18 DIAGNOSIS — I639 Cerebral infarction, unspecified: Secondary | ICD-10-CM | POA: Insufficient documentation

## 2019-07-18 DIAGNOSIS — I10 Essential (primary) hypertension: Secondary | ICD-10-CM | POA: Insufficient documentation

## 2019-07-28 ENCOUNTER — Telehealth: Payer: Self-pay

## 2019-07-28 MED ORDER — FUROSEMIDE 40 MG PO TABS: 40.0000 mg | ORAL_TABLET | Freq: Every day | ORAL | 3 refills | Status: DC

## 2019-07-28 NOTE — Telephone Encounter (Signed)
Pt had been increased to lasix 40 mg by mouth daily.  Prescription had not been sent to pharmacy.  Sent as requested.  Wife thanked nurse for help.

## 2019-07-28 NOTE — Telephone Encounter (Signed)
Pt wife states the pt can not get his prescription filled for his lasiks. She states Dr. Lovena Le told him to start taking two lasiks a day but his prescriptions says one a day. The insurance will not pay for the two a day lasiks unless he has a prescription for it. She would like a prescription sent to [facility] please. I told her I would send a phone note and have the nurse give her a call back.

## 2019-09-05 DIAGNOSIS — R7303 Prediabetes: Secondary | ICD-10-CM | POA: Diagnosis not present

## 2019-09-05 DIAGNOSIS — E782 Mixed hyperlipidemia: Secondary | ICD-10-CM | POA: Diagnosis not present

## 2019-09-05 DIAGNOSIS — F0150 Vascular dementia without behavioral disturbance: Secondary | ICD-10-CM | POA: Diagnosis not present

## 2019-09-05 DIAGNOSIS — I10 Essential (primary) hypertension: Secondary | ICD-10-CM | POA: Diagnosis not present

## 2019-09-08 DIAGNOSIS — Z1152 Encounter for screening for COVID-19: Secondary | ICD-10-CM | POA: Diagnosis not present

## 2019-09-15 DIAGNOSIS — Z136 Encounter for screening for cardiovascular disorders: Secondary | ICD-10-CM | POA: Diagnosis not present

## 2019-09-15 DIAGNOSIS — Z139 Encounter for screening, unspecified: Secondary | ICD-10-CM | POA: Diagnosis not present

## 2019-09-15 DIAGNOSIS — Z7189 Other specified counseling: Secondary | ICD-10-CM | POA: Diagnosis not present

## 2019-09-15 DIAGNOSIS — Z1339 Encounter for screening examination for other mental health and behavioral disorders: Secondary | ICD-10-CM | POA: Diagnosis not present

## 2019-09-15 DIAGNOSIS — Z1331 Encounter for screening for depression: Secondary | ICD-10-CM | POA: Diagnosis not present

## 2019-09-15 DIAGNOSIS — Z0000 Encounter for general adult medical examination without abnormal findings: Secondary | ICD-10-CM | POA: Diagnosis not present

## 2019-09-16 DIAGNOSIS — Z20828 Contact with and (suspected) exposure to other viral communicable diseases: Secondary | ICD-10-CM | POA: Diagnosis not present

## 2019-09-16 DIAGNOSIS — R05 Cough: Secondary | ICD-10-CM | POA: Diagnosis not present

## 2019-09-29 ENCOUNTER — Telehealth: Payer: Self-pay

## 2019-09-29 NOTE — Telephone Encounter (Signed)
Pt wife states she has tried to send the pt transmission but his monitor is not working.  She called [employer] tech support four times and no one answered. I told her I will message [employer] and give her a call on Wednesday to see if someone has reached out to her.

## 2019-10-01 NOTE — Telephone Encounter (Signed)
I called the pt with [employer] rep on the phone. He is receiving a new handheld for his monitor. He should receive it in 3-5 business days.

## 2019-10-03 DIAGNOSIS — Z20828 Contact with and (suspected) exposure to other viral communicable diseases: Secondary | ICD-10-CM | POA: Diagnosis not present

## 2019-10-03 DIAGNOSIS — R0981 Nasal congestion: Secondary | ICD-10-CM | POA: Diagnosis not present

## 2019-10-03 DIAGNOSIS — R05 Cough: Secondary | ICD-10-CM | POA: Diagnosis not present

## 2019-10-05 DIAGNOSIS — F0150 Vascular dementia without behavioral disturbance: Secondary | ICD-10-CM | POA: Diagnosis not present

## 2019-10-05 DIAGNOSIS — E782 Mixed hyperlipidemia: Secondary | ICD-10-CM | POA: Diagnosis not present

## 2019-10-05 DIAGNOSIS — R7303 Prediabetes: Secondary | ICD-10-CM | POA: Diagnosis not present

## 2019-10-05 DIAGNOSIS — I10 Essential (primary) hypertension: Secondary | ICD-10-CM | POA: Diagnosis not present

## 2019-10-07 ENCOUNTER — Ambulatory Visit (INDEPENDENT_AMBULATORY_CARE_PROVIDER_SITE_OTHER): Payer: Medicare PPO | Admitting: *Deleted

## 2019-10-07 DIAGNOSIS — Z950 Presence of cardiac pacemaker: Secondary | ICD-10-CM | POA: Diagnosis not present

## 2019-10-07 LAB — CUP PACEART REMOTE DEVICE CHECK
Battery Remaining Longevity: 61 mo
Battery Voltage: 2.99 V
Brady Statistic AP VP Percent: 0.12 %
Brady Statistic AP VS Percent: 98.02 %
Brady Statistic AS VP Percent: 0.01 %
Brady Statistic AS VS Percent: 1.85 %
Brady Statistic RA Percent Paced: 98.03 %
Brady Statistic RV Percent Paced: 0.15 %
Date Time Interrogation Session: 20210302090425
Implantable Lead Implant Date: 20031201
Implantable Lead Implant Date: 20031201
Implantable Lead Location: 753859
Implantable Lead Location: 753860
Implantable Lead Model: 5076
Implantable Lead Model: 5076
Implantable Pulse Generator Implant Date: 20161014
Lead Channel Impedance Value: 304 Ohm
Lead Channel Impedance Value: 361 Ohm
Lead Channel Impedance Value: 456 Ohm
Lead Channel Impedance Value: 513 Ohm
Lead Channel Pacing Threshold Amplitude: 0.625 V
Lead Channel Pacing Threshold Amplitude: 1.125 V
Lead Channel Pacing Threshold Pulse Width: 0.4 ms
Lead Channel Pacing Threshold Pulse Width: 0.4 ms
Lead Channel Sensing Intrinsic Amplitude: 0.875 mV
Lead Channel Sensing Intrinsic Amplitude: 0.875 mV
Lead Channel Sensing Intrinsic Amplitude: 9.75 mV
Lead Channel Sensing Intrinsic Amplitude: 9.75 mV
Lead Channel Setting Pacing Amplitude: 2.5 V
Lead Channel Setting Pacing Amplitude: 2.5 V
Lead Channel Setting Pacing Pulse Width: 0.4 ms
Lead Channel Setting Sensing Sensitivity: 2.8 mV

## 2019-10-08 NOTE — Progress Notes (Signed)
PPM Remote  

## 2019-10-24 DIAGNOSIS — Z6828 Body mass index (BMI) 28.0-28.9, adult: Secondary | ICD-10-CM | POA: Diagnosis not present

## 2019-10-24 DIAGNOSIS — Z79899 Other long term (current) drug therapy: Secondary | ICD-10-CM | POA: Diagnosis not present

## 2019-10-24 DIAGNOSIS — R5382 Chronic fatigue, unspecified: Secondary | ICD-10-CM | POA: Diagnosis not present

## 2019-10-24 DIAGNOSIS — G603 Idiopathic progressive neuropathy: Secondary | ICD-10-CM | POA: Diagnosis not present

## 2019-10-24 DIAGNOSIS — M150 Primary generalized (osteo)arthritis: Secondary | ICD-10-CM | POA: Diagnosis not present

## 2019-10-24 DIAGNOSIS — L4050 Arthropathic psoriasis, unspecified: Secondary | ICD-10-CM | POA: Diagnosis not present

## 2019-10-24 DIAGNOSIS — E663 Overweight: Secondary | ICD-10-CM | POA: Diagnosis not present

## 2019-11-05 DIAGNOSIS — R7303 Prediabetes: Secondary | ICD-10-CM | POA: Diagnosis not present

## 2019-11-05 DIAGNOSIS — F0150 Vascular dementia without behavioral disturbance: Secondary | ICD-10-CM | POA: Diagnosis not present

## 2019-11-05 DIAGNOSIS — I10 Essential (primary) hypertension: Secondary | ICD-10-CM | POA: Diagnosis not present

## 2019-11-05 DIAGNOSIS — E782 Mixed hyperlipidemia: Secondary | ICD-10-CM | POA: Diagnosis not present

## 2019-12-03 ENCOUNTER — Ambulatory Visit: Payer: Medicare PPO | Admitting: Internal Medicine

## 2019-12-03 ENCOUNTER — Other Ambulatory Visit: Payer: Self-pay

## 2019-12-03 ENCOUNTER — Encounter: Payer: Self-pay | Admitting: Internal Medicine

## 2019-12-03 VITALS — BP 134/66 | HR 66 | Ht 74.0 in | Wt 218.0 lb

## 2019-12-03 DIAGNOSIS — I10 Essential (primary) hypertension: Secondary | ICD-10-CM

## 2019-12-03 DIAGNOSIS — I480 Paroxysmal atrial fibrillation: Secondary | ICD-10-CM

## 2019-12-03 DIAGNOSIS — I495 Sick sinus syndrome: Secondary | ICD-10-CM

## 2019-12-03 DIAGNOSIS — Z950 Presence of cardiac pacemaker: Secondary | ICD-10-CM

## 2019-12-03 NOTE — Patient Instructions (Addendum)
[facility] OFFICE [place], [place] [address] Dept: [phone] Loc: [place]  12/03/2019  You are scheduled for a Cardiac Catheterization on Monday, May 3 with Dr. Harrell Gave End.  COVID TEST- Dec 06, 2019 AT 10:20 AM AT [address]--drive thru covid test  1. Please arrive at the [facility] (Main Entrance A) at [facility]: [address] [place], [place] [address] at 7:00 AM (This time is two hours before your procedure to ensure your preparation). Free valet parking service is available.   Special note: Every effort is made to have your procedure done on time. Please understand that emergencies sometimes delay scheduled procedures.  2. Diet: Do not eat solid foods after midnight.  The patient may have clear liquids until 5am upon the day of the procedure.  3. Labs: You will need to have blood drawn on TODAY:  BMP and CBC  4. Medication instructions in preparation for your procedure:  Stop taking your XARELTO 2 days prior to your procedure.  Your last dose will be December 05, 2019 your PM dose.  On the morning of your procedure TAKE a baby aspirin (81 mg) with a sip of water.  Do not take any other medication.  5. Plan for one night stay--bring personal belongings. 6. Bring a current list of your medications and current insurance cards. 7. You MUST have a responsible person to drive you home. 8. Someone MUST be with you the first 24 hours after you arrive home or your discharge will be delayed. 9. Please wear clothes that are easy to get on and off and wear slip-on shoes.  Thank you for allowing [place] to care for you!   -- [place] [facility]  Coronary Angioplasty  Coronary angioplasty is a procedure to widen a narrowed or blocked blood vessel of the heart (coronary artery). The artery is usually  blocked by cholesterol buildup (plaques) in the lining of the artery walls. When a vessel in the heart becomes partially blocked, there is decreased blood flow to that area. This may lead to chest pain or a heart attack (myocardial infarction). Tell a health care provider about:  Any allergies you have, including allergies to shellfish or contrast dye.  All medicines you are taking, including vitamins, herbs, eye drops, creams, and over-the-counter medicines.  Any problems you or family members have had with anesthetic medicines.  Any blood disorders you have.  Any surgeries you have had.  Any medical conditions you have.  Whether you are pregnant or may be pregnant. What are the risks? Generally, this is a safe procedure. However, problems may occur, including:  Damage to other structures or organs. This may include damage to blood vessels, leading to rupture or bleeding.  Infection, bleeding, or bruising at the site where a small, thin tube (catheter) will be inserted.  Allergic reaction to the dye or contrast that is used.  Kidney damage from the dye or contrast that is used.  Blood clots that can lead to a stroke or heart attack.  Bleeding into the abdomen (retroperitoneal bleeding). What happens before the procedure? Staying hydrated Follow instructions from your health care provider about hydration, which may include:  Up to 2 hours before the procedure - you may continue to drink clear liquids, such as water, clear fruit juice, black coffee, and plain tea. Eating and drinking  restrictions Follow instructions from your health care provider about eating and drinking, which may include:  8 hours before the procedure - stop eating heavy meals or foods such as meat, fried foods, or fatty foods.  6 hours before the procedure - stop eating light meals or foods, such as toast or cereal.  2 hours before the procedure - stop drinking clear liquids. Medicines  Ask your health  care provider about: ? Changing or stopping your regular medicines. This is especially important if you are taking diabetes medicines or blood thinners. ? Whether aspirin is recommended before this procedure.  Ask your health care provider if you can take a sip of water with any approved medicines the morning of the procedure. General instructions  Plan to have someone take you home from the hospital or clinic.  If you will be going home right after the procedure, plan to have someone with you for 24 hours. What happens during the procedure?  To reduce your risk of infection: ? Your health care team will wash or sanitize their hands. ? A germ-killing solution (antiseptic) will be used to wash the area where the catheter will be inserted. Hair may be removed from this area. The catheter may be inserted in:  Your groin area. This is the most common area.  The fold of your arm, near your elbow.  Your wrist.  An IV tube will be inserted into one of your veins.  You will be given a medicine to help you relax (sedative).  You will be given a medicine to numb the area where the catheter will be inserted (local anesthetic).  The catheter will be inserted into an artery.  The catheter will be guided to the narrowed or blocked artery using a type of X-ray (fluoroscopy).  When the catheter is near the heart, dye will be injected that makes the narrowing or blockage visible on the X-ray.  Once the catheter is positioned at the narrowed or blocked portion of the blood vessel, a balloon will be inflated to make the artery wider. Expanding the balloon will crush the plaques into the wall of the vessel and improve the blood flow.  The artery may be made wider by removing plaques using a drill, laser, or other tools.  When the blood flow is better, the balloon will be deflated and the catheter will be removed.  A stent may be placed. This is common in this procedure.  After the catheter is  removed, a special dressing will be placed over the insertion site. What happens after the procedure?  You will need to keep the area still for a few hours, or as long as directed by your health care provider. If the procedure was done in the groin, you will be instructed not to bend or cross your legs.  The insertion site will be checked often.  The pulse in your feet or wrist will be checked often.  Additional blood tests, X-rays, and an electrocardiogram (ECG) may be done.  Do not drive for 24 hours if you were given a sedative. This information is not intended to replace advice given to you by your health care provider. Make sure you discuss any questions you have with your health care provider. Document Revised: 07/06/2017 Document Reviewed: 04/21/2016 Elsevier Patient Education  2020 [employer].

## 2019-12-03 NOTE — Progress Notes (Signed)
HPI Maurice Hall returns today for followup. He is a pleasant 86 yo man with a h/o sinus node dysfunction, s/p PPM insertion, HTN, and diastolic heart failure. He has class 2 and now class 3 symptoms and has been frustrated by difficulty with exertion where he now gets sob. He has been on lasix. He has noticed some sscp with exertion over the past month. No syncope.  No Known Allergies   Current Outpatient Medications  Medication Sig Dispense Refill  . acetaminophen (TYLENOL) 500 MG tablet 500 mg. Take 3 tablets by mouth every morning    . donepezil (ARICEPT) 10 MG tablet Take 10 mg by mouth at bedtime.    . folic acid (FOLVITE) 1 MG tablet Take 1 tablet by mouth daily.    Marland Kitchen losartan (COZAAR) 100 MG tablet Take 100 mg by mouth as needed.    . Melatonin 10 MG TABS Take 1 tablet by mouth daily.    . methotrexate (50 MG/ML) 1 g injection Inject .8 ml once a week    . Omega-3 Fatty Acids (FISH OIL) 1000 MG CAPS Take 1,000 mg by mouth daily.    . simvastatin (ZOCOR) 20 MG tablet Take 20 mg by mouth at bedtime.     . tamsulosin (FLOMAX) 0.4 MG CAPS capsule Take 1 capsule by mouth daily.    . trazodone (DESYREL) 300 MG tablet Take 150 mg by mouth daily.     Carlena Hurl 20 MG TABS tablet Take 1 tablet by mouth once daily 90 tablet 1  . furosemide (LASIX) 40 MG tablet Take 1 tablet (40 mg total) by mouth daily. 90 tablet 3   No current facility-administered medications for this visit.     Past Medical History:  Diagnosis Date  . Arthritis   . Dyslipidemia   . GERD (gastroesophageal reflux disease)   . HTN (hypertension)   . Paroxysmal atrial fibrillation (HCC)   . Symptomatic bradycardia    a. s/p MDT dual chamber PPM    ROS:   All systems reviewed and negative except as noted in the HPI.   Past Surgical History:  Procedure Laterality Date  . APPENDECTOMY  1946  . EP IMPLANTABLE DEVICE N/A 05/21/2015   Procedure:  PPM Generator Changeout;  Surgeon: Marinus Maw, MD;   Location: [facility] INVASIVE CV LAB;  Service: Cardiovascular;  Laterality: N/A;  . EYE SURGERY  02/01/2007,09/02/2007   left eye, right eye  . right knee cartilage removed  [id number]  . right shoulder  08/26/1990   arthroscopy  . S/P PPM ([employer] Sigma DDD  12/03  . STERIOD INJECTION  12/11/2011   Procedure: STEROID INJECTION;  Surgeon: Loanne Drilling, MD;  Location: [facility];  Service: Orthopedics;  Laterality: Left;  . TONSILLECTOMY     age 60  . TOTAL KNEE ARTHROPLASTY  12/11/2011   Procedure: TOTAL KNEE ARTHROPLASTY;  Surgeon: Loanne Drilling, MD;  Location: [facility];  Service: Orthopedics;  Laterality: Right;  Marland Kitchen VASECTOMY  1968  . vocal cord growth   02/1999   removed growth on vocal cords     Family History  Problem Relation Age of Onset  . Coronary artery disease Neg Hx      Social History   Socioeconomic History  . Marital status: Married    Spouse name: Not on file  . Number of children: Not on file  . Years of education: Not on file  . Highest education level: Not on file  Occupational  History  . Not on file  Tobacco Use  . Smoking status: Former Smoker    Packs/day: 3.00    Years: 15.00    Pack years: 45.00    Types: Cigarettes    Quit date: 12/04/1963    Years since quitting: 56.0  . Smokeless tobacco: Never Used  Substance and Sexual Activity  . Alcohol use: No  . Drug use: No    Comment: Rare ETOH   . Sexual activity: Not on file  Other Topics Concern  . Not on file  Social History Narrative  . Not on file   Social Determinants of Health   Financial Resource Strain:   . Difficulty of Paying Living Expenses:   Food Insecurity:   . Worried About Charity fundraiser in the Last Year:   . Arboriculturist in the Last Year:   Transportation Needs:   . Film/video editor (Medical):   Marland Kitchen Lack of Transportation (Non-Medical):   Physical Activity:   . Days of Exercise per Week:   . Minutes of Exercise per Session:   Stress:   . Feeling of Stress :    Social Connections:   . Frequency of Communication with Friends and Family:   . Frequency of Social Gatherings with Friends and Family:   . Attends Religious Services:   . Active Member of Clubs or Organizations:   . Attends Archivist Meetings:   Marland Kitchen Marital Status:   Intimate Partner Violence:   . Fear of Current or Ex-Partner:   . Emotionally Abused:   Marland Kitchen Physically Abused:   . Sexually Abused:      BP 134/66   Pulse 66   Ht 6\' 2"  (1.88 m)   Wt 218 lb (98.9 kg)   SpO2 97%   BMI 27.99 kg/m   Physical Exam:  Well appearing NAD HEENT: Unremarkable Neck:  No JVD, no thyromegally Lymphatics:  No adenopathy Back:  No CVA tenderness Lungs:  Clear with no wheezes HEART:  Regular rate rhythm, no murmurs, no rubs, no clicks Abd:  soft, positive bowel sounds, no organomegally, no rebound, no guarding Ext:  2 plus pulses, no edema, no cyanosis, no clubbing Skin:  No rashes no nodules Neuro:  CN II through XII intact, motor grossly intact  EKG - nsr with atrial pacing  DEVICE  Normal device function.  See PaceArt for details.   Assess/Plan: 1. Worsening sob and chest pressure - I have discussed the possible etiology of his symptoms. His echo from 8 months ago demonstrates a mean gradient of 10. He has a very soft systolic murmur on exam. I strongly suspect the development of obstructive CAD. He may however just have a severe case of diastolic dysfunction. I will have him undergo left and right heart cath.  2. Sinus node dysfunction - he is asymptomatic, s/p PPM insertion. 3. Diastolic dysfunction - we noted this on his echo in the past. He is on lasix. He is encouraged to avoid salty food. 4. HTN - his bp is controlled and he will continue his cozaar. 5. PAF - he is maintaining NSR over 99.9% of the time. He will hold his xarelto before his heart cath and restart after.  Mikle Bosworth.D.

## 2019-12-03 NOTE — H&P (View-Only) (Signed)
HPI Maurice Hall returns today for followup. He is a pleasant 86 yo man with a h/o sinus node dysfunction, s/p PPM insertion, HTN, and diastolic heart failure. He has class 2 and now class 3 symptoms and has been frustrated by difficulty with exertion where he now gets sob. He has been on lasix. He has noticed some sscp with exertion over the past month. No syncope.  No Known Allergies   Current Outpatient Medications  Medication Sig Dispense Refill  . acetaminophen (TYLENOL) 500 MG tablet 500 mg. Take 3 tablets by mouth every morning    . donepezil (ARICEPT) 10 MG tablet Take 10 mg by mouth at bedtime.    . folic acid (FOLVITE) 1 MG tablet Take 1 tablet by mouth daily.    Marland Kitchen losartan (COZAAR) 100 MG tablet Take 100 mg by mouth as needed.    . Melatonin 10 MG TABS Take 1 tablet by mouth daily.    . methotrexate (50 MG/ML) 1 g injection Inject .8 ml once a week    . Omega-3 Fatty Acids (FISH OIL) 1000 MG CAPS Take 1,000 mg by mouth daily.    . simvastatin (ZOCOR) 20 MG tablet Take 20 mg by mouth at bedtime.     . tamsulosin (FLOMAX) 0.4 MG CAPS capsule Take 1 capsule by mouth daily.    . trazodone (DESYREL) 300 MG tablet Take 150 mg by mouth daily.     Maurice Hall 20 MG TABS tablet Take 1 tablet by mouth once daily 90 tablet 1  . furosemide (LASIX) 40 MG tablet Take 1 tablet (40 mg total) by mouth daily. 90 tablet 3   No current facility-administered medications for this visit.     Past Medical History:  Diagnosis Date  . Arthritis   . Dyslipidemia   . GERD (gastroesophageal reflux disease)   . HTN (hypertension)   . Paroxysmal atrial fibrillation (HCC)   . Symptomatic bradycardia    a. s/p MDT dual chamber PPM    ROS:   All systems reviewed and negative except as noted in the HPI.   Past Surgical History:  Procedure Laterality Date  . APPENDECTOMY  1946  . EP IMPLANTABLE DEVICE N/A 05/21/2015   Procedure:  PPM Generator Changeout;  Surgeon: Marinus Maw, MD;   Location: [facility] INVASIVE CV LAB;  Service: Cardiovascular;  Laterality: N/A;  . EYE SURGERY  02/01/2007,09/02/2007   left eye, right eye  . right knee cartilage removed  [id number],[id number]  . right shoulder  08/26/1990   arthroscopy  . S/P PPM (Medtronic Sigma DDD  12/03  . STERIOD INJECTION  12/11/2011   Procedure: STEROID INJECTION;  Surgeon: Loanne Drilling, MD;  Location: [facility] ORS;  Service: Orthopedics;  Laterality: Left;  . TONSILLECTOMY     age 60  . TOTAL KNEE ARTHROPLASTY  12/11/2011   Procedure: TOTAL KNEE ARTHROPLASTY;  Surgeon: Loanne Drilling, MD;  Location: [facility] ORS;  Service: Orthopedics;  Laterality: Right;  Marland Kitchen VASECTOMY  1968  . vocal cord growth   02/1999   removed growth on vocal cords     Family History  Problem Relation Age of Onset  . Coronary artery disease Neg Hx      Social History   Socioeconomic History  . Marital status: Married    Spouse name: Not on file  . Number of children: Not on file  . Years of education: Not on file  . Highest education level: Not on file  Occupational  History  . Not on file  Tobacco Use  . Smoking status: Former Smoker    Packs/day: 3.00    Years: 15.00    Pack years: 45.00    Types: Cigarettes    Quit date: 12/04/1963    Years since quitting: 56.0  . Smokeless tobacco: Never Used  Substance and Sexual Activity  . Alcohol use: No  . Drug use: No    Comment: Rare ETOH   . Sexual activity: Not on file  Other Topics Concern  . Not on file  Social History Narrative  . Not on file   Social Determinants of Health   Financial Resource Strain:   . Difficulty of Paying Living Expenses:   Food Insecurity:   . Worried About Running Out of Food in the Last Year:   . Ran Out of Food in the Last Year:   Transportation Needs:   . Lack of Transportation (Medical):   . Lack of Transportation (Non-Medical):   Physical Activity:   . Days of Exercise per Week:   . Minutes of Exercise per Session:   Stress:   . Feeling of Stress :    Social Connections:   . Frequency of Communication with Friends and Family:   . Frequency of Social Gatherings with Friends and Family:   . Attends Religious Services:   . Active Member of Clubs or Organizations:   . Attends Club or Organization Meetings:   . Marital Status:   Intimate Partner Violence:   . Fear of Current or Ex-Partner:   . Emotionally Abused:   . Physically Abused:   . Sexually Abused:      BP 134/66   Pulse 66   Ht 6' 2" (1.88 m)   Wt 218 lb (98.9 kg)   SpO2 97%   BMI 27.99 kg/m   Physical Exam:  Well appearing NAD HEENT: Unremarkable Neck:  No JVD, no thyromegally Lymphatics:  No adenopathy Back:  No CVA tenderness Lungs:  Clear with no wheezes HEART:  Regular rate rhythm, no murmurs, no rubs, no clicks Abd:  soft, positive bowel sounds, no organomegally, no rebound, no guarding Ext:  2 plus pulses, no edema, no cyanosis, no clubbing Skin:  No rashes no nodules Neuro:  CN II through XII intact, motor grossly intact  EKG - nsr with atrial pacing  DEVICE  Normal device function.  See PaceArt for details.   Assess/Plan: 1. Worsening sob and chest pressure - I have discussed the possible etiology of his symptoms. His echo from 8 months ago demonstrates a mean gradient of 10. He has a very soft systolic murmur on exam. I strongly suspect the development of obstructive CAD. He may however just have a severe case of diastolic dysfunction. I will have him undergo left and right heart cath.  2. Sinus node dysfunction - he is asymptomatic, s/p PPM insertion. 3. Diastolic dysfunction - we noted this on his echo in the past. He is on lasix. He is encouraged to avoid salty food. 4. HTN - his bp is controlled and he will continue his cozaar. 5. PAF - he is maintaining NSR over 99.9% of the time. He will hold his xarelto before his heart cath and restart after.  Nycole Kawahara,M.D. 

## 2019-12-04 LAB — CBC WITH DIFFERENTIAL/PLATELET
Basophils Absolute: 0.1 10*3/uL (ref 0.0–0.2)
Basos: 1 %
EOS (ABSOLUTE): 0.2 10*3/uL (ref 0.0–0.4)
Eos: 2 %
Hematocrit: 43.4 % (ref 37.5–51.0)
Hemoglobin: 14.7 g/dL (ref 13.0–17.7)
Immature Grans (Abs): 0 10*3/uL (ref 0.0–0.1)
Immature Granulocytes: 0 %
Lymphocytes Absolute: 1.3 10*3/uL (ref 0.7–3.1)
Lymphs: 18 %
MCH: 33.9 pg — ABNORMAL HIGH (ref 26.6–33.0)
MCHC: 33.9 g/dL (ref 31.5–35.7)
MCV: 100 fL — ABNORMAL HIGH (ref 79–97)
Monocytes Absolute: 0.8 10*3/uL (ref 0.1–0.9)
Monocytes: 10 %
Neutrophils Absolute: 5.1 10*3/uL (ref 1.4–7.0)
Neutrophils: 69 %
Platelets: 175 10*3/uL (ref 150–450)
RBC: 4.34 x10E6/uL (ref 4.14–5.80)
RDW: 13.3 % (ref 11.6–15.4)
WBC: 7.5 10*3/uL (ref 3.4–10.8)

## 2019-12-04 LAB — BASIC METABOLIC PANEL
BUN/Creatinine Ratio: 18 (ref 10–24)
BUN: 18 mg/dL (ref 8–27)
CO2: 24 mmol/L (ref 20–29)
Calcium: 9.9 mg/dL (ref 8.6–10.2)
Chloride: 105 mmol/L (ref 96–106)
Creatinine, Ser: 0.98 mg/dL (ref 0.76–1.27)
GFR calc Af Amer: 80 mL/min/{1.73_m2} (ref >59)
GFR calc non Af Amer: 70 mL/min/{1.73_m2} (ref >59)
Glucose: 89 mg/dL (ref 65–99)
Potassium: 4.2 mmol/L (ref 3.5–5.2)
Sodium: 142 mmol/L (ref 134–144)

## 2019-12-05 DIAGNOSIS — F0150 Vascular dementia without behavioral disturbance: Secondary | ICD-10-CM | POA: Diagnosis not present

## 2019-12-05 DIAGNOSIS — E782 Mixed hyperlipidemia: Secondary | ICD-10-CM | POA: Diagnosis not present

## 2019-12-05 DIAGNOSIS — I10 Essential (primary) hypertension: Secondary | ICD-10-CM | POA: Diagnosis not present

## 2019-12-05 DIAGNOSIS — R7303 Prediabetes: Secondary | ICD-10-CM | POA: Diagnosis not present

## 2019-12-06 ENCOUNTER — Other Ambulatory Visit (HOSPITAL_COMMUNITY)
Admission: RE | Admit: 2019-12-06 | Discharge: 2019-12-06 | Disposition: A | Discharge status: Home / Self Care | Payer: Medicare PPO | Source: Ambulatory Visit | Attending: Internal Medicine | Admitting: Internal Medicine

## 2019-12-06 DIAGNOSIS — Z01812 Encounter for preprocedural laboratory examination: Secondary | ICD-10-CM | POA: Diagnosis not present

## 2019-12-06 DIAGNOSIS — Z20822 Contact with and (suspected) exposure to covid-19: Secondary | ICD-10-CM | POA: Diagnosis not present

## 2019-12-06 LAB — SARS CORONAVIRUS 2 (TAT 6-24 HRS): SARS Coronavirus 2: NEGATIVE

## 2019-12-08 ENCOUNTER — Encounter (HOSPITAL_COMMUNITY)
Admission: RE | Disposition: A | Discharge status: Home / Self Care | Payer: Self-pay | Source: Home / Self Care | Attending: Internal Medicine

## 2019-12-08 ENCOUNTER — Ambulatory Visit (HOSPITAL_COMMUNITY)
Admission: RE | Admit: 2019-12-08 | Discharge: 2019-12-08 | Disposition: A | Discharge status: Home / Self Care | Payer: Medicare PPO | Attending: Internal Medicine | Admitting: Internal Medicine

## 2019-12-08 DIAGNOSIS — Z7901 Long term (current) use of anticoagulants: Secondary | ICD-10-CM | POA: Diagnosis not present

## 2019-12-08 DIAGNOSIS — I2510 Atherosclerotic heart disease of native coronary artery without angina pectoris: Secondary | ICD-10-CM | POA: Diagnosis not present

## 2019-12-08 DIAGNOSIS — M1990 Unspecified osteoarthritis, unspecified site: Secondary | ICD-10-CM | POA: Diagnosis not present

## 2019-12-08 DIAGNOSIS — Z950 Presence of cardiac pacemaker: Secondary | ICD-10-CM | POA: Insufficient documentation

## 2019-12-08 DIAGNOSIS — E785 Hyperlipidemia, unspecified: Secondary | ICD-10-CM | POA: Diagnosis not present

## 2019-12-08 DIAGNOSIS — Z87891 Personal history of nicotine dependence: Secondary | ICD-10-CM | POA: Insufficient documentation

## 2019-12-08 DIAGNOSIS — I495 Sick sinus syndrome: Secondary | ICD-10-CM | POA: Diagnosis not present

## 2019-12-08 DIAGNOSIS — R079 Chest pain, unspecified: Secondary | ICD-10-CM

## 2019-12-08 DIAGNOSIS — I110 Hypertensive heart disease with heart failure: Secondary | ICD-10-CM | POA: Insufficient documentation

## 2019-12-08 DIAGNOSIS — I5030 Unspecified diastolic (congestive) heart failure: Secondary | ICD-10-CM | POA: Insufficient documentation

## 2019-12-08 DIAGNOSIS — K219 Gastro-esophageal reflux disease without esophagitis: Secondary | ICD-10-CM | POA: Insufficient documentation

## 2019-12-08 DIAGNOSIS — I480 Paroxysmal atrial fibrillation: Secondary | ICD-10-CM | POA: Diagnosis not present

## 2019-12-08 DIAGNOSIS — R0600 Dyspnea, unspecified: Secondary | ICD-10-CM

## 2019-12-08 DIAGNOSIS — Z96651 Presence of right artificial knee joint: Secondary | ICD-10-CM | POA: Insufficient documentation

## 2019-12-08 DIAGNOSIS — Z79899 Other long term (current) drug therapy: Secondary | ICD-10-CM | POA: Insufficient documentation

## 2019-12-08 HISTORY — PX: RIGHT/LEFT HEART CATH AND CORONARY ANGIOGRAPHY: CATH118266

## 2019-12-08 LAB — POCT I-STAT 7, (LYTES, BLD GAS, ICA,H+H)
Acid-base deficit: 1 mmol/L (ref 0.0–2.0)
Bicarbonate: 24.9 mmol/L (ref 20.0–28.0)
Calcium, Ion: 1.29 mmol/L (ref 1.15–1.40)
HCT: 38 % — ABNORMAL LOW (ref 39.0–52.0)
Hemoglobin: 12.9 g/dL — ABNORMAL LOW (ref 13.0–17.0)
O2 Saturation: 98 %
Potassium: 3.5 mmol/L (ref 3.5–5.1)
Sodium: 144 mmol/L (ref 135–145)
TCO2: 26 mmol/L (ref 22–32)
pCO2 arterial: 43.9 mmHg (ref 32.0–48.0)
pH, Arterial: 7.363 (ref 7.350–7.450)
pO2, Arterial: 113 mmHg — ABNORMAL HIGH (ref 83.0–108.0)

## 2019-12-08 LAB — POCT I-STAT EG7
Acid-Base Excess: 0 mmol/L (ref 0.0–2.0)
Bicarbonate: 25.4 mmol/L (ref 20.0–28.0)
Calcium, Ion: 1.29 mmol/L (ref 1.15–1.40)
HCT: 38 % — ABNORMAL LOW (ref 39.0–52.0)
Hemoglobin: 12.9 g/dL — ABNORMAL LOW (ref 13.0–17.0)
O2 Saturation: 76 %
Potassium: 3.6 mmol/L (ref 3.5–5.1)
Sodium: 143 mmol/L (ref 135–145)
TCO2: 27 mmol/L (ref 22–32)
pCO2, Ven: 43.4 mmHg — ABNORMAL LOW (ref 44.0–60.0)
pH, Ven: 7.376 (ref 7.250–7.430)
pO2, Ven: 42 mmHg (ref 32.0–45.0)

## 2019-12-08 SURGERY — RIGHT/LEFT HEART CATH AND CORONARY ANGIOGRAPHY
Anesthesia: LOCAL

## 2019-12-08 MED ORDER — MIDAZOLAM HCL 2 MG/2ML IJ SOLN
INTRAMUSCULAR | Status: AC
  Filled 2019-12-08: qty 2

## 2019-12-08 MED ORDER — SODIUM CHLORIDE 0.9 % WEIGHT BASED INFUSION: 1.0000 mL/kg/h | INTRAVENOUS | Status: DC

## 2019-12-08 MED ORDER — IOHEXOL 350 MG/ML SOLN
INTRAVENOUS | Status: DC | PRN
  Administered 2019-12-08: 10:00:00 40 mL

## 2019-12-08 MED ORDER — MIDAZOLAM HCL 2 MG/2ML IJ SOLN
INTRAMUSCULAR | Status: DC | PRN
  Administered 2019-12-08 (×2): 0.5 mg via INTRAVENOUS

## 2019-12-08 MED ORDER — SODIUM CHLORIDE 0.9 % WEIGHT BASED INFUSION
3.0000 mL/kg/h | INTRAVENOUS | Status: AC
  Administered 2019-12-08: 3 mL/kg/h via INTRAVENOUS

## 2019-12-08 MED ORDER — ASPIRIN 81 MG PO CHEW: 81.0000 mg | CHEWABLE_TABLET | ORAL | Status: DC

## 2019-12-08 MED ORDER — FENTANYL CITRATE (PF) 100 MCG/2ML IJ SOLN
INTRAMUSCULAR | Status: AC
  Filled 2019-12-08: qty 2

## 2019-12-08 MED ORDER — HEPARIN (PORCINE) IN NACL 1000-0.9 UT/500ML-% IV SOLN
INTRAVENOUS | Status: AC
  Filled 2019-12-08: qty 1000

## 2019-12-08 MED ORDER — SODIUM CHLORIDE 0.9 % IV SOLN: 250.0000 mL | INTRAVENOUS | Status: DC | PRN

## 2019-12-08 MED ORDER — SODIUM CHLORIDE 0.9% FLUSH: 3.0000 mL | Freq: Two times a day (BID) | INTRAVENOUS | Status: DC

## 2019-12-08 MED ORDER — HEPARIN SODIUM (PORCINE) 1000 UNIT/ML IJ SOLN
INTRAMUSCULAR | Status: AC
  Filled 2019-12-08: qty 1

## 2019-12-08 MED ORDER — SODIUM CHLORIDE 0.9% FLUSH: 3.0000 mL | INTRAVENOUS | Status: DC | PRN

## 2019-12-08 MED ORDER — LIDOCAINE HCL (PF) 1 % IJ SOLN
INTRAMUSCULAR | Status: AC
  Filled 2019-12-08: qty 30

## 2019-12-08 MED ORDER — HEPARIN SODIUM (PORCINE) 1000 UNIT/ML IJ SOLN
INTRAMUSCULAR | Status: DC | PRN
  Administered 2019-12-08: 5000 [IU] via INTRAVENOUS

## 2019-12-08 MED ORDER — ONDANSETRON HCL 4 MG/2ML IJ SOLN: 4.0000 mg | Freq: Four times a day (QID) | INTRAMUSCULAR | Status: DC | PRN

## 2019-12-08 MED ORDER — LIDOCAINE HCL (PF) 1 % IJ SOLN
INTRAMUSCULAR | Status: DC | PRN
  Administered 2019-12-08 (×2): 2 mL

## 2019-12-08 MED ORDER — LABETALOL HCL 5 MG/ML IV SOLN: 10.0000 mg | INTRAVENOUS | Status: DC | PRN

## 2019-12-08 MED ORDER — VERAPAMIL HCL 2.5 MG/ML IV SOLN
INTRAVENOUS | Status: AC
  Filled 2019-12-08: qty 2

## 2019-12-08 MED ORDER — HEPARIN (PORCINE) IN NACL 1000-0.9 UT/500ML-% IV SOLN
INTRAVENOUS | Status: DC | PRN
  Administered 2019-12-08 (×2): 500 mL

## 2019-12-08 MED ORDER — ACETAMINOPHEN 325 MG PO TABS: 650.0000 mg | ORAL_TABLET | ORAL | Status: DC | PRN

## 2019-12-08 MED ORDER — SODIUM CHLORIDE 0.9 % IV SOLN: INTRAVENOUS | Status: DC

## 2019-12-08 MED ORDER — VERAPAMIL HCL 2.5 MG/ML IV SOLN
INTRAVENOUS | Status: DC | PRN
  Administered 2019-12-08: 10:00:00 10 mL via INTRA_ARTERIAL

## 2019-12-08 MED ORDER — HYDRALAZINE HCL 20 MG/ML IJ SOLN: 10.0000 mg | INTRAMUSCULAR | Status: DC | PRN

## 2019-12-08 MED ORDER — FENTANYL CITRATE (PF) 100 MCG/2ML IJ SOLN
INTRAMUSCULAR | Status: DC | PRN
  Administered 2019-12-08 (×2): 12.5 ug via INTRAVENOUS

## 2019-12-08 SURGICAL SUPPLY — 12 items

## 2019-12-08 NOTE — Brief Op Note (Signed)
BRIEF CATHETERIZATION NOTE  12/08/2019  10:15 AM  PATIENT:  Maurice Hall  86 y.o. male  PRE-OPERATIVE DIAGNOSIS:  Dyspnea  POST-OPERATIVE DIAGNOSIS:  Same  PROCEDURE:  Procedure(s): RIGHT/LEFT HEART CATH AND CORONARY ANGIOGRAPHY (N/A)  SURGEON:  Surgeon(s) and Role:    * Quin Mcpherson, MD - Primary  FINDINGS: 1. Mild to moderate, non-obstructive CAD. 2. Normal left and right heart filling pressures. 3. Normal Fick cardiac output.  RECOMMENDATIONS: 1. Medical therapy and risk factor modification to prevent progression of disease.  Yvonne Kendall, MD [facility]

## 2019-12-08 NOTE — Discharge Instructions (Signed)
Radial Site Care  This sheet gives you information about how to care for yourself after your procedure. Your health care provider may also give you more specific instructions. If you have problems or questions, contact your health care provider. What can I expect after the procedure? After the procedure, it is common to have:  Bruising and tenderness at the catheter insertion area. Follow these instructions at home: Medicines  Take over-the-counter and prescription medicines only as told by your health care provider. Insertion site care  Follow instructions from your health care provider about how to take care of your insertion site. Make sure you: ? Wash your hands with soap and water before you change your bandage (dressing). If soap and water are not available, use hand sanitizer. ? Change your dressing as told by your health care provider. ? Leave stitches (sutures), skin glue, or adhesive strips in place. These skin closures may need to stay in place for 2 weeks or longer. If adhesive strip edges start to loosen and curl up, you may trim the loose edges. Do not remove adhesive strips completely unless your health care provider tells you to do that.  Check your insertion site every day for signs of infection. Check for: ? Redness, swelling, or pain. ? Fluid or blood. ? Pus or a bad smell. ? Warmth.  Do not take baths, swim, or use a hot tub until your health care provider approves.  You may shower 24-48 hours after the procedure, or as directed by your health care provider. ? Remove the dressing and gently wash the site with plain soap and water. ? Pat the area dry with a clean towel. ? Do not rub the site. That could cause bleeding.  Do not apply powder or lotion to the site. Activity   For 24 hours after the procedure, or as directed by your health care provider: ? Do not flex or bend the affected arm. ? Do not push or pull heavy objects with the affected arm. ? Do not  drive yourself home from the hospital or clinic. You may drive 24 hours after the procedure unless your health care provider tells you not to. ? Do not operate machinery or power tools.  Do not lift anything that is heavier than 10 lb (4.5 kg), or the limit that you are told, until your health care provider says that it is safe.  Ask your health care provider when it is okay to: ? Return to work or school. ? Resume usual physical activities or sports. ? Resume sexual activity. General instructions  If the catheter site starts to bleed, raise your arm and put firm pressure on the site. If the bleeding does not stop, get help right away. This is a medical emergency.  If you went home on the same day as your procedure, a responsible adult should be with you for the first 24 hours after you arrive home.  Keep all follow-up visits as told by your health care provider. This is important. Contact a health care provider if:  You have a fever.  You have redness, swelling, or yellow drainage around your insertion site. Get help right away if:  You have unusual pain at the radial site.  The catheter insertion area swells very fast.  The insertion area is bleeding, and the bleeding does not stop when you hold steady pressure on the area.  Your arm or hand becomes pale, cool, tingly, or numb. These symptoms may represent a serious problem   that is an emergency. Do not wait to see if the symptoms will go away. Get medical help right away. Call your local emergency services (911 in the [place] Do not drive yourself to the hospital. Summary  After the procedure, it is common to have bruising and tenderness at the site.  Follow instructions from your health care provider about how to take care of your radial site wound. Check the wound every day for signs of infection.  Do not lift anything that is heavier than 10 lb (4.5 kg), or the limit that you are told, until your health care provider says  that it is safe. This information is not intended to replace advice given to you by your health care provider. Make sure you discuss any questions you have with your health care provider. Document Revised: 08/29/2017 Document Reviewed: 08/29/2017 Elsevier Patient Education  2020 Elsevier Inc.  

## 2019-12-08 NOTE — Interval H&P Note (Signed)
History and Physical Interval Note:  12/08/2019 9:08 AM  Erling Conte  has presented today for surgery, with the diagnosis of dyspnea.  The various methods of treatment have been discussed with the patient and family. After consideration of risks, benefits and other options for treatment, the patient has consented to  Procedure(s): RIGHT/LEFT HEART CATH AND CORONARY ANGIOGRAPHY (N/A) as a surgical intervention.  The patient's history has been reviewed, patient examined, no change in status, stable for surgery.  I have reviewed the patient's chart and labs.  Questions were answered to the patient's satisfaction.    Cath Lab Visit (complete for each Cath Lab visit)  Clinical Evaluation Leading to the Procedure:   ACS: No.  Non-ACS:    Anginal Classification: CCS IV  Anti-ischemic medical therapy: No Therapy  Non-Invasive Test Results: No non-invasive testing performed  Prior CABG: No previous CABG  Maurice Hall

## 2019-12-09 ENCOUNTER — Telehealth: Payer: Self-pay

## 2019-12-09 DIAGNOSIS — R0600 Dyspnea, unspecified: Secondary | ICD-10-CM

## 2019-12-09 NOTE — Telephone Encounter (Signed)
Call placed to Pt.  Advised per Dr. Hedwig Morton would like Pt to have CPX  Pt in agreement.  Test ordered.  Will mail instruction letter to Pt.

## 2019-12-09 NOTE — Telephone Encounter (Signed)
-----   Message from Marinus Maw, MD sent at 12/08/2019 10:23 PM EDT ----- No significant narrowing. He will need a repeat CPX test to help [place] better understanding his exertional dyspnea.

## 2019-12-10 LAB — CUP PACEART INCLINIC DEVICE CHECK
Battery Remaining Longevity: 53 mo
Battery Voltage: 2.99 V
Brady Statistic AP VP Percent: 0.13 %
Brady Statistic AP VS Percent: 97.74 %
Brady Statistic AS VP Percent: 0.01 %
Brady Statistic AS VS Percent: 2.12 %
Brady Statistic RA Percent Paced: 97.74 %
Brady Statistic RV Percent Paced: 0.16 %
Date Time Interrogation Session: 20210428162648
Implantable Lead Implant Date: 20031201
Implantable Lead Implant Date: 20031201
Implantable Lead Location: 753859
Implantable Lead Location: 753860
Implantable Lead Model: 5076
Implantable Lead Model: 5076
Implantable Pulse Generator Implant Date: 20161014
Lead Channel Impedance Value: 342 Ohm
Lead Channel Impedance Value: 399 Ohm
Lead Channel Impedance Value: 551 Ohm
Lead Channel Impedance Value: 608 Ohm
Lead Channel Pacing Threshold Amplitude: 0.875 V
Lead Channel Pacing Threshold Amplitude: 1.25 V
Lead Channel Pacing Threshold Pulse Width: 0.4 ms
Lead Channel Pacing Threshold Pulse Width: 0.4 ms
Lead Channel Sensing Intrinsic Amplitude: 0.75 mV
Lead Channel Sensing Intrinsic Amplitude: 1.5 mV
Lead Channel Sensing Intrinsic Amplitude: 10.375 mV
Lead Channel Sensing Intrinsic Amplitude: 13.875 mV
Lead Channel Setting Pacing Amplitude: 2.5 V
Lead Channel Setting Pacing Amplitude: 2.75 V
Lead Channel Setting Pacing Pulse Width: 0.4 ms
Lead Channel Setting Sensing Sensitivity: 2.8 mV

## 2019-12-12 DIAGNOSIS — E782 Mixed hyperlipidemia: Secondary | ICD-10-CM | POA: Diagnosis not present

## 2019-12-12 DIAGNOSIS — R7303 Prediabetes: Secondary | ICD-10-CM | POA: Diagnosis not present

## 2019-12-18 DIAGNOSIS — M1712 Unilateral primary osteoarthritis, left knee: Secondary | ICD-10-CM | POA: Diagnosis not present

## 2019-12-22 DIAGNOSIS — R0602 Shortness of breath: Secondary | ICD-10-CM | POA: Diagnosis not present

## 2019-12-22 DIAGNOSIS — I5030 Unspecified diastolic (congestive) heart failure: Secondary | ICD-10-CM | POA: Diagnosis not present

## 2019-12-22 DIAGNOSIS — F0150 Vascular dementia without behavioral disturbance: Secondary | ICD-10-CM | POA: Diagnosis not present

## 2019-12-22 DIAGNOSIS — I482 Chronic atrial fibrillation, unspecified: Secondary | ICD-10-CM | POA: Diagnosis not present

## 2020-01-05 DIAGNOSIS — I482 Chronic atrial fibrillation, unspecified: Secondary | ICD-10-CM | POA: Diagnosis not present

## 2020-01-05 DIAGNOSIS — F0150 Vascular dementia without behavioral disturbance: Secondary | ICD-10-CM | POA: Diagnosis not present

## 2020-01-05 DIAGNOSIS — I5030 Unspecified diastolic (congestive) heart failure: Secondary | ICD-10-CM | POA: Diagnosis not present

## 2020-01-05 DIAGNOSIS — E782 Mixed hyperlipidemia: Secondary | ICD-10-CM | POA: Diagnosis not present

## 2020-01-06 ENCOUNTER — Ambulatory Visit (INDEPENDENT_AMBULATORY_CARE_PROVIDER_SITE_OTHER): Payer: Medicare PPO | Admitting: *Deleted

## 2020-01-06 ENCOUNTER — Other Ambulatory Visit (HOSPITAL_COMMUNITY)
Admission: RE | Admit: 2020-01-06 | Discharge: 2020-01-06 | Disposition: A | Discharge status: Home / Self Care | Payer: Medicare PPO | Source: Ambulatory Visit | Attending: Internal Medicine | Admitting: Internal Medicine

## 2020-01-06 DIAGNOSIS — I480 Paroxysmal atrial fibrillation: Secondary | ICD-10-CM

## 2020-01-06 DIAGNOSIS — Z20822 Contact with and (suspected) exposure to covid-19: Secondary | ICD-10-CM | POA: Diagnosis not present

## 2020-01-06 DIAGNOSIS — Z01812 Encounter for preprocedural laboratory examination: Secondary | ICD-10-CM | POA: Insufficient documentation

## 2020-01-06 DIAGNOSIS — I495 Sick sinus syndrome: Secondary | ICD-10-CM | POA: Diagnosis not present

## 2020-01-06 LAB — SARS CORONAVIRUS 2 (TAT 6-24 HRS): SARS Coronavirus 2: NEGATIVE

## 2020-01-07 LAB — CUP PACEART REMOTE DEVICE CHECK
Battery Remaining Longevity: 56 mo
Battery Voltage: 2.99 V
Brady Statistic AP VP Percent: 0.11 %
Brady Statistic AP VS Percent: 96.14 %
Brady Statistic AS VP Percent: 0.01 %
Brady Statistic AS VS Percent: 3.74 %
Brady Statistic RA Percent Paced: 96.08 %
Brady Statistic RV Percent Paced: 0.14 %
Date Time Interrogation Session: 20210601090843
Implantable Lead Implant Date: 20031201
Implantable Lead Implant Date: 20031201
Implantable Lead Location: 753859
Implantable Lead Location: 753860
Implantable Lead Model: 5076
Implantable Lead Model: 5076
Implantable Pulse Generator Implant Date: 20161014
Lead Channel Impedance Value: 304 Ohm
Lead Channel Impedance Value: 361 Ohm
Lead Channel Impedance Value: 437 Ohm
Lead Channel Impedance Value: 475 Ohm
Lead Channel Pacing Threshold Amplitude: 0.75 V
Lead Channel Pacing Threshold Amplitude: 1.125 V
Lead Channel Pacing Threshold Pulse Width: 0.4 ms
Lead Channel Pacing Threshold Pulse Width: 0.4 ms
Lead Channel Sensing Intrinsic Amplitude: 0.75 mV
Lead Channel Sensing Intrinsic Amplitude: 0.75 mV
Lead Channel Sensing Intrinsic Amplitude: 10.5 mV
Lead Channel Sensing Intrinsic Amplitude: 10.5 mV
Lead Channel Setting Pacing Amplitude: 2.5 V
Lead Channel Setting Pacing Amplitude: 2.5 V
Lead Channel Setting Pacing Pulse Width: 0.4 ms
Lead Channel Setting Sensing Sensitivity: 2.8 mV

## 2020-01-07 NOTE — Progress Notes (Signed)
Remote pacemaker transmission.   

## 2020-01-08 ENCOUNTER — Ambulatory Visit (HOSPITAL_COMMUNITY): Payer: Medicare PPO | Attending: Internal Medicine

## 2020-01-08 ENCOUNTER — Other Ambulatory Visit: Payer: Self-pay

## 2020-01-08 ENCOUNTER — Other Ambulatory Visit (HOSPITAL_COMMUNITY): Payer: Self-pay | Admitting: *Deleted

## 2020-01-08 DIAGNOSIS — R0600 Dyspnea, unspecified: Secondary | ICD-10-CM | POA: Insufficient documentation

## 2020-01-10 ENCOUNTER — Other Ambulatory Visit: Payer: Self-pay | Admitting: Internal Medicine

## 2020-01-12 NOTE — Telephone Encounter (Signed)
Pt last saw Dr Ladona Ridgel 12/03/19, last labs 12/03/19 Creat 0.98, age 86, weight 99.8kg, CrCl 76.38, based on CrCl pt is on appropriate dosage of Xarelto 20mg  QD.  Will refill rx.

## 2020-02-04 DIAGNOSIS — F0150 Vascular dementia without behavioral disturbance: Secondary | ICD-10-CM | POA: Diagnosis not present

## 2020-02-04 DIAGNOSIS — I482 Chronic atrial fibrillation, unspecified: Secondary | ICD-10-CM | POA: Diagnosis not present

## 2020-02-04 DIAGNOSIS — I5030 Unspecified diastolic (congestive) heart failure: Secondary | ICD-10-CM | POA: Diagnosis not present

## 2020-02-04 DIAGNOSIS — E782 Mixed hyperlipidemia: Secondary | ICD-10-CM | POA: Diagnosis not present

## 2020-02-19 DIAGNOSIS — L4050 Arthropathic psoriasis, unspecified: Secondary | ICD-10-CM | POA: Diagnosis not present

## 2020-03-07 DIAGNOSIS — I5030 Unspecified diastolic (congestive) heart failure: Secondary | ICD-10-CM | POA: Diagnosis not present

## 2020-03-07 DIAGNOSIS — E782 Mixed hyperlipidemia: Secondary | ICD-10-CM | POA: Diagnosis not present

## 2020-03-07 DIAGNOSIS — I482 Chronic atrial fibrillation, unspecified: Secondary | ICD-10-CM | POA: Diagnosis not present

## 2020-03-07 DIAGNOSIS — F0150 Vascular dementia without behavioral disturbance: Secondary | ICD-10-CM | POA: Diagnosis not present

## 2020-04-02 DIAGNOSIS — I700 Atherosclerosis of aorta: Secondary | ICD-10-CM | POA: Diagnosis not present

## 2020-04-02 DIAGNOSIS — G8929 Other chronic pain: Secondary | ICD-10-CM | POA: Diagnosis not present

## 2020-04-02 DIAGNOSIS — M545 Low back pain: Secondary | ICD-10-CM | POA: Diagnosis not present

## 2020-04-02 DIAGNOSIS — M47816 Spondylosis without myelopathy or radiculopathy, lumbar region: Secondary | ICD-10-CM | POA: Diagnosis not present

## 2020-04-02 DIAGNOSIS — M160 Bilateral primary osteoarthritis of hip: Secondary | ICD-10-CM | POA: Diagnosis not present

## 2020-04-02 DIAGNOSIS — M5136 Other intervertebral disc degeneration, lumbar region: Secondary | ICD-10-CM | POA: Diagnosis not present

## 2020-04-02 DIAGNOSIS — Z6829 Body mass index (BMI) 29.0-29.9, adult: Secondary | ICD-10-CM | POA: Diagnosis not present

## 2020-04-02 DIAGNOSIS — M549 Dorsalgia, unspecified: Secondary | ICD-10-CM | POA: Diagnosis not present

## 2020-04-02 DIAGNOSIS — M47817 Spondylosis without myelopathy or radiculopathy, lumbosacral region: Secondary | ICD-10-CM | POA: Diagnosis not present

## 2020-04-02 DIAGNOSIS — F0150 Vascular dementia without behavioral disturbance: Secondary | ICD-10-CM | POA: Diagnosis not present

## 2020-04-06 ENCOUNTER — Ambulatory Visit (INDEPENDENT_AMBULATORY_CARE_PROVIDER_SITE_OTHER): Payer: Medicare PPO | Admitting: *Deleted

## 2020-04-06 DIAGNOSIS — I495 Sick sinus syndrome: Secondary | ICD-10-CM

## 2020-04-06 LAB — CUP PACEART REMOTE DEVICE CHECK
Battery Remaining Longevity: 57 mo
Battery Voltage: 2.99 V
Brady Statistic AP VP Percent: 0.15 %
Brady Statistic AP VS Percent: 97.95 %
Brady Statistic AS VP Percent: 0.01 %
Brady Statistic AS VS Percent: 1.9 %
Brady Statistic RA Percent Paced: 97.94 %
Brady Statistic RV Percent Paced: 0.18 %
Date Time Interrogation Session: 20210831095402
Implantable Lead Implant Date: 20031201
Implantable Lead Implant Date: 20031201
Implantable Lead Location: 753859
Implantable Lead Location: 753860
Implantable Lead Model: 5076
Implantable Lead Model: 5076
Implantable Pulse Generator Implant Date: 20161014
Lead Channel Impedance Value: 304 Ohm
Lead Channel Impedance Value: 361 Ohm
Lead Channel Impedance Value: 456 Ohm
Lead Channel Impedance Value: 494 Ohm
Lead Channel Pacing Threshold Amplitude: 0.625 V
Lead Channel Pacing Threshold Amplitude: 1.125 V
Lead Channel Pacing Threshold Pulse Width: 0.4 ms
Lead Channel Pacing Threshold Pulse Width: 0.4 ms
Lead Channel Sensing Intrinsic Amplitude: 0.75 mV
Lead Channel Sensing Intrinsic Amplitude: 0.75 mV
Lead Channel Sensing Intrinsic Amplitude: 9.75 mV
Lead Channel Sensing Intrinsic Amplitude: 9.75 mV
Lead Channel Setting Pacing Amplitude: 2.5 V
Lead Channel Setting Pacing Amplitude: 2.5 V
Lead Channel Setting Pacing Pulse Width: 0.4 ms
Lead Channel Setting Sensing Sensitivity: 2.8 mV

## 2020-04-07 DIAGNOSIS — F0150 Vascular dementia without behavioral disturbance: Secondary | ICD-10-CM | POA: Diagnosis not present

## 2020-04-07 DIAGNOSIS — I482 Chronic atrial fibrillation, unspecified: Secondary | ICD-10-CM | POA: Diagnosis not present

## 2020-04-07 DIAGNOSIS — E782 Mixed hyperlipidemia: Secondary | ICD-10-CM | POA: Diagnosis not present

## 2020-04-07 DIAGNOSIS — I5030 Unspecified diastolic (congestive) heart failure: Secondary | ICD-10-CM | POA: Diagnosis not present

## 2020-04-07 NOTE — Progress Notes (Signed)
Remote pacemaker transmission.   

## 2020-04-19 DIAGNOSIS — M5136 Other intervertebral disc degeneration, lumbar region: Secondary | ICD-10-CM | POA: Diagnosis not present

## 2020-04-19 DIAGNOSIS — M47816 Spondylosis without myelopathy or radiculopathy, lumbar region: Secondary | ICD-10-CM | POA: Diagnosis not present

## 2020-04-19 DIAGNOSIS — Z6830 Body mass index (BMI) 30.0-30.9, adult: Secondary | ICD-10-CM | POA: Diagnosis not present

## 2020-04-23 ENCOUNTER — Telehealth: Payer: Self-pay | Admitting: Internal Medicine

## 2020-04-23 DIAGNOSIS — M545 Low back pain, unspecified: Secondary | ICD-10-CM

## 2020-04-23 NOTE — Telephone Encounter (Signed)
Per Dr. Junie Spencer to refer   Referral order placed

## 2020-04-23 NOTE — Telephone Encounter (Signed)
Returned call to pt's wife.    States Pt has been walking more per Dr. Lubertha Basque direction and now his back is very painful.  Requesting a referral to [facility] Dr. Sung Amabile for assessment.  Will ask Dr. Ladona Ridgel.  Fax # is [phone] Attn:  Vikki Ports

## 2020-04-23 NOTE — Telephone Encounter (Signed)
Sallye Ober is calling requesting to speak with Ancil Boozer in regards to a problem with Renae Fickle. Please advise.

## 2020-04-28 ENCOUNTER — Encounter: Payer: Self-pay | Admitting: Internal Medicine

## 2020-04-28 ENCOUNTER — Ambulatory Visit: Payer: Medicare PPO | Admitting: Internal Medicine

## 2020-04-28 ENCOUNTER — Other Ambulatory Visit: Payer: Self-pay

## 2020-04-28 VITALS — BP 130/74 | HR 83 | Ht 74.0 in | Wt 218.0 lb

## 2020-04-28 DIAGNOSIS — I480 Paroxysmal atrial fibrillation: Secondary | ICD-10-CM

## 2020-04-28 DIAGNOSIS — I495 Sick sinus syndrome: Secondary | ICD-10-CM

## 2020-04-28 DIAGNOSIS — Z950 Presence of cardiac pacemaker: Secondary | ICD-10-CM

## 2020-04-28 NOTE — Progress Notes (Signed)
HPI [name] returns today for ongoing evaluation and management of dyspnea. He has sinus node dysfunction, s/p PPM insertion. He has HTN. He has been bothered by worsening sob and underwent left heart cath which demonstrated only non-obstrucitve CAD. He had normal cardiac output and pressures. He underwent CPX testing demonstrating normal exercise capacity for his age. The rec was to start a regular program of cardiovascular exercise. In the interim, he notes he "threw out his back." He has ongoing pain in his legs. He has had an Xray. He denies problems of continence. He did not fall. No trauma. No Known Allergies   Current Outpatient Medications  Medication Sig Dispense Refill  . acetaminophen (TYLENOL) 500 MG tablet Take 1,500 mg by mouth daily.     Marland Kitchen donepezil (ARICEPT) 10 MG tablet Take 10 mg by mouth daily.     . dorzolamide-timolol (COSOPT) 22.3-6.8 MG/ML ophthalmic solution Place 1 drop into the right eye 2 (two) times daily.    . folic acid (FOLVITE) 1 MG tablet Take 1 mg by mouth daily.     . furosemide (LASIX) 40 MG tablet Take 1 tablet (40 mg total) by mouth daily. 90 tablet 3  . losartan (COZAAR) 100 MG tablet Take 100 mg by mouth daily as needed (High blood pressure).     . Melatonin 10 MG TABS Take 10 mg by mouth at bedtime.     . methotrexate 50 MG/2ML injection Inject 20 mg into the skin every Thursday. 0.8 ml in stomach    . Misc Natural Products (OSTEO BI-FLEX ADV JOINT SHIELD PO) Take 3 tablets by mouth daily.    . Omega-3 Fatty Acids (FISH OIL) 1000 MG CAPS Take 3,000 mg by mouth daily.     . prednisoLONE acetate (PRED FORTE) 1 % ophthalmic suspension Place 1 drop into the right eye daily as needed (Pain).     . simvastatin (ZOCOR) 20 MG tablet Take 20 mg by mouth at bedtime.     . tamsulosin (FLOMAX) 0.4 MG CAPS capsule Take 0.4 mg by mouth daily.     . trazodone (DESYREL) 300 MG tablet Take 150 mg by mouth at bedtime.     Maurice Hall 20 MG TABS tablet Take 1  tablet by mouth once daily 90 tablet 1   No current facility-administered medications for this visit.     Past Medical History:  Diagnosis Date  . Arthritis   . Dyslipidemia   . GERD (gastroesophageal reflux disease)   . HTN (hypertension)   . Paroxysmal atrial fibrillation (HCC)   . Symptomatic bradycardia    a. s/p MDT dual chamber PPM    ROS:   All systems reviewed and negative except as noted in the HPI.   Past Surgical History:  Procedure Laterality Date  . APPENDECTOMY  1946  . EP IMPLANTABLE DEVICE N/A 05/21/2015   Procedure:  PPM Generator Changeout;  Surgeon: Marinus Maw, MD;  Location: [facility] INVASIVE CV LAB;  Service: Cardiovascular;  Laterality: N/A;  . EYE SURGERY  02/01/2007,09/02/2007   left eye, right eye  . right knee cartilage removed  [id number]  . right shoulder  08/26/1990   arthroscopy  . RIGHT/LEFT HEART CATH AND CORONARY ANGIOGRAPHY N/A 12/08/2019   Procedure: RIGHT/LEFT HEART CATH AND CORONARY ANGIOGRAPHY;  Surgeon: Yvonne Kendall, MD;  Location: [facility] INVASIVE CV LAB;  Service: Cardiovascular;  Laterality: N/A;  . S/P PPM (Medtronic Sigma DDD  12/03  . STERIOD INJECTION  12/11/2011  Procedure: STEROID INJECTION;  Surgeon: Loanne Drilling, MD;  Location: [facility] OR[facility];  Service: Orthopedics;  Laterality: Left;  . TONSILLECTOMY     age 27  . TOTAL KNEE ARTHROPLASTY  12/11/2011   Procedure: TOTAL KNEE ARTHROPLASTY;  Surgeon: Loanne Drilling, MD;  Location: [facility];  Service: Orthopedics;  Laterality: Right;  Marland Kitchen VASECTOMY  1968  . vocal cord growth   02/1999   removed growth on vocal cords     Family History  Problem Relation Age of Onset  . Coronary artery disease Neg Hx      Social History   Socioeconomic History  . Marital status: Married    Spouse name: Not on file  . Number of children: Not on file  . Years of education: Not on file  . Highest education level: Not on file  Occupational History  . Not on file  Tobacco Use  . Smoking status:  Former Smoker    Packs/day: 3.00    Years: 15.00    Pack years: 45.00    Types: Cigarettes    Quit date: 12/04/1963    Years since quitting: 56.4  . Smokeless tobacco: Never Used  Vaping Use  . Vaping Use: Never used  Substance and Sexual Activity  . Alcohol use: No  . Drug use: No    Comment: Rare ETOH   . Sexual activity: Not on file  Other Topics Concern  . Not on file  Social History Narrative  . Not on file   Social Determinants of Health   Financial Resource Strain:   . Difficulty of Paying Living Expenses: Not on file  Food Insecurity:   . Worried About Programme researcher, broadcasting/film/video in the Last Year: Not on file  . Ran Out of Food in the Last Year: Not on file  Transportation Needs:   . Lack of Transportation (Medical): Not on file  . Lack of Transportation (Non-Medical): Not on file  Physical Activity:   . Days of Exercise per Week: Not on file  . Minutes of Exercise per Session: Not on file  Stress:   . Feeling of Stress : Not on file  Social Connections:   . Frequency of Communication with Friends and Family: Not on file  . Frequency of Social Gatherings with Friends and Family: Not on file  . Attends Religious Services: Not on file  . Active Member of Clubs or Organizations: Not on file  . Attends Banker Meetings: Not on file  . Marital Status: Not on file  Intimate Partner Violence:   . Fear of Current or Ex-Partner: Not on file  . Emotionally Abused: Not on file  . Physically Abused: Not on file  . Sexually Abused: Not on file     Ht 6\' 2"  (1.88 m)   BMI 28.25 kg/m   Physical Exam:  Well appearing NAD HEENT: Unremarkable Neck:  No JVD, no thyromegally Lymphatics:  No adenopathy Back:  No CVA tenderness Lungs:  Clear with no wheezes HEART:  Regular rate rhythm, no murmurs, no rubs, no clicks Abd:  soft, positive bowel sounds, no organomegally, no rebound, no guarding Ext:  2 plus pulses, no edema, no cyanosis, no clubbing Skin:  No  rashes no nodules Neuro:  CN II through XII intact, motor grossly intact  EKG - NSR with atrial pacing  DEVICE  Normal device function.  See PaceArt for details.   Assess/Plan: 1. Dyspnea - his symptoms are well controlled. He is now more  sedentary but hopes to be less so.  2. Back pain - his has had xrays and is pending a visit to [facility] 3. HTN - his bp is minimally elevated. 4. Sinus node dysfunction - he is asymptomatic, s/p PPM insertion. His Medtronic DDD PM is MRI compatible.  Sharlot Gowda Shahed Yeoman,MD

## 2020-04-28 NOTE — Patient Instructions (Signed)
Medication Instructions:  °Your physician recommends that you continue on your current medications as directed. Please refer to the Current Medication list given to you today. ° °Labwork: °None ordered. ° °Testing/Procedures: °None ordered. ° °Follow-Up: °Your physician wants you to follow-up in: one year with Dr. Taylor.   You will receive a reminder letter in the mail two months in advance. If you don't receive a letter, please call our office to schedule the follow-up appointment. ° °Remote monitoring is used to monitor your Pacemaker from home. This monitoring reduces the number of office visits required to check your device to one time per year. It allows us to keep an eye on the functioning of your device to ensure it is working properly. You are scheduled for a device check from home on 07/06/2020. You may send your transmission at any time that day. If you have a wireless device, the transmission will be sent automatically. After your physician reviews your transmission, you will receive a postcard with your next transmission date. ° °Any Other Special Instructions Will Be Listed Below (If Applicable). ° °If you need a refill on your cardiac medications before your next appointment, please call your pharmacy.  ° °

## 2020-05-03 ENCOUNTER — Telehealth: Payer: Self-pay | Admitting: Internal Medicine

## 2020-05-03 LAB — CUP PACEART INCLINIC DEVICE CHECK
Battery Remaining Longevity: 56 mo
Battery Voltage: 2.99 V
Brady Statistic AP VP Percent: 0.13 %
Brady Statistic AP VS Percent: 97.54 %
Brady Statistic AS VP Percent: 0.01 %
Brady Statistic AS VS Percent: 2.32 %
Brady Statistic RA Percent Paced: 97.52 %
Brady Statistic RV Percent Paced: 0.16 %
Date Time Interrogation Session: 20210922164257
Implantable Lead Implant Date: 20031201
Implantable Lead Implant Date: 20031201
Implantable Lead Location: 753859
Implantable Lead Location: 753860
Implantable Lead Model: 5076
Implantable Lead Model: 5076
Implantable Pulse Generator Implant Date: 20161014
Lead Channel Impedance Value: 342 Ohm
Lead Channel Impedance Value: 399 Ohm
Lead Channel Impedance Value: 589 Ohm
Lead Channel Impedance Value: 646 Ohm
Lead Channel Pacing Threshold Amplitude: 0.875 V
Lead Channel Pacing Threshold Amplitude: 1.125 V
Lead Channel Pacing Threshold Pulse Width: 0.4 ms
Lead Channel Pacing Threshold Pulse Width: 0.4 ms
Lead Channel Sensing Intrinsic Amplitude: 0.875 mV
Lead Channel Sensing Intrinsic Amplitude: 1.5 mV
Lead Channel Sensing Intrinsic Amplitude: 11 mV
Lead Channel Sensing Intrinsic Amplitude: 9.75 mV
Lead Channel Setting Pacing Amplitude: 2.25 V
Lead Channel Setting Pacing Amplitude: 2.5 V
Lead Channel Setting Pacing Pulse Width: 0.4 ms
Lead Channel Setting Sensing Sensitivity: 2.8 mV

## 2020-05-03 NOTE — Telephone Encounter (Signed)
New message    Pt wife is calling asking for a call about his referral. She said Boneta Lucks said to call if they had not heard anything in two days.

## 2020-05-03 NOTE — Telephone Encounter (Signed)
Pt wife called back and stated that she no longer needs a call. The office called and scheduled them an appt.

## 2020-05-04 DIAGNOSIS — R5382 Chronic fatigue, unspecified: Secondary | ICD-10-CM | POA: Diagnosis not present

## 2020-05-04 DIAGNOSIS — L4050 Arthropathic psoriasis, unspecified: Secondary | ICD-10-CM | POA: Diagnosis not present

## 2020-05-04 DIAGNOSIS — G603 Idiopathic progressive neuropathy: Secondary | ICD-10-CM | POA: Diagnosis not present

## 2020-05-04 DIAGNOSIS — Z79899 Other long term (current) drug therapy: Secondary | ICD-10-CM | POA: Diagnosis not present

## 2020-05-04 DIAGNOSIS — M150 Primary generalized (osteo)arthritis: Secondary | ICD-10-CM | POA: Diagnosis not present

## 2020-05-04 DIAGNOSIS — Z6827 Body mass index (BMI) 27.0-27.9, adult: Secondary | ICD-10-CM | POA: Diagnosis not present

## 2020-05-04 DIAGNOSIS — E663 Overweight: Secondary | ICD-10-CM | POA: Diagnosis not present

## 2020-05-07 DIAGNOSIS — F0150 Vascular dementia without behavioral disturbance: Secondary | ICD-10-CM | POA: Diagnosis not present

## 2020-05-07 DIAGNOSIS — I482 Chronic atrial fibrillation, unspecified: Secondary | ICD-10-CM | POA: Diagnosis not present

## 2020-05-07 DIAGNOSIS — Z6829 Body mass index (BMI) 29.0-29.9, adult: Secondary | ICD-10-CM | POA: Diagnosis not present

## 2020-05-07 DIAGNOSIS — F0390 Unspecified dementia without behavioral disturbance: Secondary | ICD-10-CM | POA: Diagnosis not present

## 2020-05-07 DIAGNOSIS — M47816 Spondylosis without myelopathy or radiculopathy, lumbar region: Secondary | ICD-10-CM | POA: Diagnosis not present

## 2020-05-07 DIAGNOSIS — E782 Mixed hyperlipidemia: Secondary | ICD-10-CM | POA: Diagnosis not present

## 2020-05-12 DIAGNOSIS — M549 Dorsalgia, unspecified: Secondary | ICD-10-CM | POA: Diagnosis not present

## 2020-05-12 DIAGNOSIS — M5136 Other intervertebral disc degeneration, lumbar region: Secondary | ICD-10-CM | POA: Diagnosis not present

## 2020-05-12 DIAGNOSIS — M47816 Spondylosis without myelopathy or radiculopathy, lumbar region: Secondary | ICD-10-CM | POA: Diagnosis not present

## 2020-05-14 ENCOUNTER — Other Ambulatory Visit (HOSPITAL_COMMUNITY): Payer: Self-pay | Admitting: Pain Medicine

## 2020-05-14 DIAGNOSIS — M549 Dorsalgia, unspecified: Secondary | ICD-10-CM

## 2020-05-24 ENCOUNTER — Ambulatory Visit (HOSPITAL_COMMUNITY)
Admission: RE | Admit: 2020-05-24 | Discharge: 2020-05-24 | Disposition: A | Discharge status: Home / Self Care | Payer: Medicare PPO | Source: Ambulatory Visit | Attending: Pain Medicine | Admitting: Pain Medicine

## 2020-05-24 ENCOUNTER — Other Ambulatory Visit: Payer: Self-pay

## 2020-05-24 DIAGNOSIS — M549 Dorsalgia, unspecified: Secondary | ICD-10-CM | POA: Insufficient documentation

## 2020-05-24 DIAGNOSIS — M545 Low back pain, unspecified: Secondary | ICD-10-CM | POA: Diagnosis not present

## 2020-05-24 NOTE — Progress Notes (Signed)
Informed of MRI for today.   Device system confirmed to be MRI conditional, with implant date > 6 weeks ago and no evidence of abandoned or epicardial leads in review of most recent CXR Interrogation from today reviewed, pt is currently AP-VS at ~86 bpm. Confirmed with RN that HRs now in 60s Change device settings for MRI to DOO at 85 bpm  Tachy-therapies to off if applicable.   Program device back to pre-MRI settings after completion of exam.  Maurice Hall  05/24/2020 1:54 PM

## 2020-05-24 NOTE — Progress Notes (Addendum)
Per order, changed device settings for MRI to DOO at 85 bpm   Tachy-therapies to off if applicable.   Will program device back to pre-MRI settings after completion of exam  

## 2020-06-07 DIAGNOSIS — F0150 Vascular dementia without behavioral disturbance: Secondary | ICD-10-CM | POA: Diagnosis not present

## 2020-06-07 DIAGNOSIS — E782 Mixed hyperlipidemia: Secondary | ICD-10-CM | POA: Diagnosis not present

## 2020-06-07 DIAGNOSIS — M47816 Spondylosis without myelopathy or radiculopathy, lumbar region: Secondary | ICD-10-CM | POA: Diagnosis not present

## 2020-06-07 DIAGNOSIS — I482 Chronic atrial fibrillation, unspecified: Secondary | ICD-10-CM | POA: Diagnosis not present

## 2020-06-08 ENCOUNTER — Telehealth: Payer: Self-pay | Admitting: *Deleted

## 2020-06-08 DIAGNOSIS — M47816 Spondylosis without myelopathy or radiculopathy, lumbar region: Secondary | ICD-10-CM | POA: Diagnosis not present

## 2020-06-08 DIAGNOSIS — M5136 Other intervertebral disc degeneration, lumbar region: Secondary | ICD-10-CM | POA: Diagnosis not present

## 2020-06-08 NOTE — Telephone Encounter (Signed)
Clinical pharmacist to review Xarelto 

## 2020-06-08 NOTE — Telephone Encounter (Signed)
° °  [facility] Pre-operative Risk Assessment    HEARTCARE STAFF: - Please ensure there is not already an duplicate clearance open for this procedure. - Under Visit Info/Reason for Call, type in Other and utilize the format Clearance MM/DD/YY or Clearance TBD. Do not use dashes or single digits. - If request is for dental extraction, please clarify the # of teeth to be extracted.  Request for surgical clearance:  1. What type of surgery is being performed? L5-S1 ESI   2. When is this surgery scheduled? 06/25/20   3. What type of clearance is required (medical clearance vs. Pharmacy clearance to hold med vs. Both)? BOTH  4. Are there any medications that need to be held prior to surgery and how long? XARELTO x 3 DAYS PRIOR TO INJECTION   5. Practice name and name of physician performing surgery? [facility]; DR. Davy Pique   6. What is the office phone number? [phone]   7.   What is the office fax number? [phone]  8.   Anesthesia type (None, local, MAC, general) ? NOT LISTED   Maurice Hall 06/08/2020, 1:51 PM  _________________________________________________________________   (provider comments below)

## 2020-06-09 NOTE — Telephone Encounter (Signed)
Patient with diagnosis of atrial fibrillation on Xarelto for anticoagulation.    Procedure: L5-S1 ESI Date of procedure: 06/25/20 [id number]} CHA2DS2-VASc Score = 4  This indicates a 4.8% annual risk of stroke. The patient's score is based upon: CHF History: 0 HTN History: 1 Diabetes History: 0 Stroke History: 0 Vascular Disease History: 1 Age Score: 2 Gender Score: 0   CrCl 76 mL/min based on last Scr of 0.98 on 12/08/19 Platelet count 175 (12/03/19)  Per office protocol, patient can hold Xarelto for 3 days prior to procedure.

## 2020-06-09 NOTE — Telephone Encounter (Signed)
   Primary Cardiologist: Dr. Ladona Ridgel  Chart reviewed as part of pre-operative protocol coverage. Given past medical history and time since last visit, based on ACC/AHA guidelines, Maurice Hall would be at acceptable risk for the planned procedure without further cardiovascular testing.   The patient was advised that if he develops new symptoms prior to surgery to contact our office to arrange for a follow-up visit, and he verbalized understanding.  I will route this recommendation to the requesting party via Epic fax function and remove from pre-op pool.  Please call with questions.  [place], [place] 06/09/2020, 1:08 PM

## 2020-06-16 DIAGNOSIS — M79675 Pain in left toe(s): Secondary | ICD-10-CM | POA: Diagnosis not present

## 2020-06-16 DIAGNOSIS — B351 Tinea unguium: Secondary | ICD-10-CM | POA: Diagnosis not present

## 2020-06-16 DIAGNOSIS — M79674 Pain in right toe(s): Secondary | ICD-10-CM | POA: Diagnosis not present

## 2020-06-16 DIAGNOSIS — L603 Nail dystrophy: Secondary | ICD-10-CM | POA: Diagnosis not present

## 2020-06-25 DIAGNOSIS — M5136 Other intervertebral disc degeneration, lumbar region: Secondary | ICD-10-CM | POA: Diagnosis not present

## 2020-07-06 ENCOUNTER — Ambulatory Visit (INDEPENDENT_AMBULATORY_CARE_PROVIDER_SITE_OTHER): Payer: Medicare PPO

## 2020-07-06 DIAGNOSIS — I495 Sick sinus syndrome: Secondary | ICD-10-CM

## 2020-07-07 DIAGNOSIS — F0150 Vascular dementia without behavioral disturbance: Secondary | ICD-10-CM | POA: Diagnosis not present

## 2020-07-07 DIAGNOSIS — M47816 Spondylosis without myelopathy or radiculopathy, lumbar region: Secondary | ICD-10-CM | POA: Diagnosis not present

## 2020-07-07 DIAGNOSIS — I482 Chronic atrial fibrillation, unspecified: Secondary | ICD-10-CM | POA: Diagnosis not present

## 2020-07-07 DIAGNOSIS — E782 Mixed hyperlipidemia: Secondary | ICD-10-CM | POA: Diagnosis not present

## 2020-07-07 LAB — CUP PACEART REMOTE DEVICE CHECK
Battery Remaining Longevity: 49 mo
Battery Voltage: 2.99 V
Brady Statistic AP VP Percent: 0.19 %
Brady Statistic AP VS Percent: 97.58 %
Brady Statistic AS VP Percent: 0.01 %
Brady Statistic AS VS Percent: 2.22 %
Brady Statistic RA Percent Paced: 97.62 %
Brady Statistic RV Percent Paced: 0.23 %
Date Time Interrogation Session: 20211130112311
Implantable Lead Implant Date: 20031201
Implantable Lead Implant Date: 20031201
Implantable Lead Location: 753859
Implantable Lead Location: 753860
Implantable Lead Model: 5076
Implantable Lead Model: 5076
Implantable Pulse Generator Implant Date: 20161014
Lead Channel Impedance Value: 323 Ohm
Lead Channel Impedance Value: 380 Ohm
Lead Channel Impedance Value: 494 Ohm
Lead Channel Impedance Value: 551 Ohm
Lead Channel Pacing Threshold Amplitude: 0.75 V
Lead Channel Pacing Threshold Amplitude: 1.25 V
Lead Channel Pacing Threshold Pulse Width: 0.4 ms
Lead Channel Pacing Threshold Pulse Width: 0.4 ms
Lead Channel Sensing Intrinsic Amplitude: 0.875 mV
Lead Channel Sensing Intrinsic Amplitude: 0.875 mV
Lead Channel Sensing Intrinsic Amplitude: 11.875 mV
Lead Channel Sensing Intrinsic Amplitude: 11.875 mV
Lead Channel Setting Pacing Amplitude: 2.5 V
Lead Channel Setting Pacing Amplitude: 2.5 V
Lead Channel Setting Pacing Pulse Width: 0.4 ms
Lead Channel Setting Sensing Sensitivity: 2.8 mV

## 2020-07-12 NOTE — Progress Notes (Signed)
Remote pacemaker transmission.   

## 2020-07-13 ENCOUNTER — Other Ambulatory Visit: Payer: Self-pay | Admitting: Internal Medicine

## 2020-07-13 NOTE — Telephone Encounter (Signed)
Xarelto 20mg  refill request received. Pt is 86 years old, weight-98.9kg, Crea-0.98 on 12/03/2019, last seen by Dr. 12/05/2019 on 04/28/2020, Diagnosis-Afib, CrCl-74.11ml/min; Dose is appropriate based on dosing criteria. Will send in refill to requested pharmacy.

## 2020-07-16 DIAGNOSIS — L4050 Arthropathic psoriasis, unspecified: Secondary | ICD-10-CM | POA: Diagnosis not present

## 2020-07-21 DIAGNOSIS — Z6828 Body mass index (BMI) 28.0-28.9, adult: Secondary | ICD-10-CM | POA: Diagnosis not present

## 2020-07-21 DIAGNOSIS — M549 Dorsalgia, unspecified: Secondary | ICD-10-CM | POA: Diagnosis not present

## 2020-07-21 DIAGNOSIS — M47816 Spondylosis without myelopathy or radiculopathy, lumbar region: Secondary | ICD-10-CM | POA: Diagnosis not present

## 2020-07-21 DIAGNOSIS — I10 Essential (primary) hypertension: Secondary | ICD-10-CM | POA: Diagnosis not present

## 2020-07-24 ENCOUNTER — Other Ambulatory Visit: Payer: Self-pay | Admitting: Internal Medicine

## 2020-07-28 ENCOUNTER — Other Ambulatory Visit: Payer: Self-pay | Admitting: Internal Medicine

## 2020-07-28 DIAGNOSIS — H3411 Central retinal artery occlusion, right eye: Secondary | ICD-10-CM | POA: Diagnosis not present

## 2020-07-28 DIAGNOSIS — H4051X4 Glaucoma secondary to other eye disorders, right eye, indeterminate stage: Secondary | ICD-10-CM | POA: Diagnosis not present

## 2020-08-07 DIAGNOSIS — F0150 Vascular dementia without behavioral disturbance: Secondary | ICD-10-CM | POA: Diagnosis not present

## 2020-08-07 DIAGNOSIS — E782 Mixed hyperlipidemia: Secondary | ICD-10-CM | POA: Diagnosis not present

## 2020-08-07 DIAGNOSIS — I482 Chronic atrial fibrillation, unspecified: Secondary | ICD-10-CM | POA: Diagnosis not present

## 2020-08-16 DIAGNOSIS — H02002 Unspecified entropion of right lower eyelid: Secondary | ICD-10-CM | POA: Diagnosis not present

## 2020-08-16 DIAGNOSIS — G245 Blepharospasm: Secondary | ICD-10-CM | POA: Diagnosis not present

## 2020-08-19 DIAGNOSIS — M47816 Spondylosis without myelopathy or radiculopathy, lumbar region: Secondary | ICD-10-CM | POA: Diagnosis not present

## 2020-09-01 DIAGNOSIS — R7303 Prediabetes: Secondary | ICD-10-CM | POA: Diagnosis not present

## 2020-09-01 DIAGNOSIS — E785 Hyperlipidemia, unspecified: Secondary | ICD-10-CM | POA: Diagnosis not present

## 2020-09-06 DIAGNOSIS — F0150 Vascular dementia without behavioral disturbance: Secondary | ICD-10-CM | POA: Diagnosis not present

## 2020-09-06 DIAGNOSIS — Z1331 Encounter for screening for depression: Secondary | ICD-10-CM | POA: Diagnosis not present

## 2020-09-06 DIAGNOSIS — I10 Essential (primary) hypertension: Secondary | ICD-10-CM | POA: Diagnosis not present

## 2020-09-06 DIAGNOSIS — E785 Hyperlipidemia, unspecified: Secondary | ICD-10-CM | POA: Diagnosis not present

## 2020-09-06 DIAGNOSIS — Z1339 Encounter for screening examination for other mental health and behavioral disorders: Secondary | ICD-10-CM | POA: Diagnosis not present

## 2020-09-06 DIAGNOSIS — R7303 Prediabetes: Secondary | ICD-10-CM | POA: Diagnosis not present

## 2020-09-06 DIAGNOSIS — Z139 Encounter for screening, unspecified: Secondary | ICD-10-CM | POA: Diagnosis not present

## 2020-09-06 DIAGNOSIS — Z136 Encounter for screening for cardiovascular disorders: Secondary | ICD-10-CM | POA: Diagnosis not present

## 2020-09-06 DIAGNOSIS — Z0000 Encounter for general adult medical examination without abnormal findings: Secondary | ICD-10-CM | POA: Diagnosis not present

## 2020-09-07 DIAGNOSIS — I482 Chronic atrial fibrillation, unspecified: Secondary | ICD-10-CM | POA: Diagnosis not present

## 2020-09-07 DIAGNOSIS — F0150 Vascular dementia without behavioral disturbance: Secondary | ICD-10-CM | POA: Diagnosis not present

## 2020-09-07 DIAGNOSIS — E782 Mixed hyperlipidemia: Secondary | ICD-10-CM | POA: Diagnosis not present

## 2020-09-14 DIAGNOSIS — M47816 Spondylosis without myelopathy or radiculopathy, lumbar region: Secondary | ICD-10-CM | POA: Diagnosis not present

## 2020-09-14 DIAGNOSIS — M5136 Other intervertebral disc degeneration, lumbar region: Secondary | ICD-10-CM | POA: Diagnosis not present

## 2020-09-21 DIAGNOSIS — Z7901 Long term (current) use of anticoagulants: Secondary | ICD-10-CM | POA: Diagnosis not present

## 2020-09-21 DIAGNOSIS — Z01818 Encounter for other preprocedural examination: Secondary | ICD-10-CM | POA: Diagnosis not present

## 2020-09-21 DIAGNOSIS — H02002 Unspecified entropion of right lower eyelid: Secondary | ICD-10-CM | POA: Diagnosis not present

## 2020-10-05 ENCOUNTER — Ambulatory Visit (INDEPENDENT_AMBULATORY_CARE_PROVIDER_SITE_OTHER): Payer: Medicare PPO

## 2020-10-05 DIAGNOSIS — E785 Hyperlipidemia, unspecified: Secondary | ICD-10-CM | POA: Diagnosis not present

## 2020-10-05 DIAGNOSIS — F0150 Vascular dementia without behavioral disturbance: Secondary | ICD-10-CM | POA: Diagnosis not present

## 2020-10-05 DIAGNOSIS — I495 Sick sinus syndrome: Secondary | ICD-10-CM

## 2020-10-05 DIAGNOSIS — I482 Chronic atrial fibrillation, unspecified: Secondary | ICD-10-CM | POA: Diagnosis not present

## 2020-10-05 DIAGNOSIS — E782 Mixed hyperlipidemia: Secondary | ICD-10-CM | POA: Diagnosis not present

## 2020-10-05 LAB — CUP PACEART REMOTE DEVICE CHECK
Battery Remaining Longevity: 42 mo
Battery Voltage: 2.98 V
Brady Statistic AP VP Percent: 0.21 %
Brady Statistic AP VS Percent: 96.72 %
Brady Statistic AS VP Percent: 0.01 %
Brady Statistic AS VS Percent: 3.05 %
Brady Statistic RA Percent Paced: 96.55 %
Brady Statistic RV Percent Paced: 0.25 %
Date Time Interrogation Session: 20220301115618
Implantable Lead Implant Date: 20031201
Implantable Lead Implant Date: 20031201
Implantable Lead Location: 753859
Implantable Lead Location: 753860
Implantable Lead Model: 5076
Implantable Lead Model: 5076
Implantable Pulse Generator Implant Date: 20161014
Lead Channel Impedance Value: 304 Ohm
Lead Channel Impedance Value: 361 Ohm
Lead Channel Impedance Value: 494 Ohm
Lead Channel Impedance Value: 532 Ohm
Lead Channel Pacing Threshold Amplitude: 0.625 V
Lead Channel Pacing Threshold Amplitude: 1.5 V
Lead Channel Pacing Threshold Pulse Width: 0.4 ms
Lead Channel Pacing Threshold Pulse Width: 0.4 ms
Lead Channel Sensing Intrinsic Amplitude: 0.875 mV
Lead Channel Sensing Intrinsic Amplitude: 0.875 mV
Lead Channel Sensing Intrinsic Amplitude: 9.875 mV
Lead Channel Sensing Intrinsic Amplitude: 9.875 mV
Lead Channel Setting Pacing Amplitude: 2.5 V
Lead Channel Setting Pacing Amplitude: 3 V
Lead Channel Setting Pacing Pulse Width: 0.4 ms
Lead Channel Setting Sensing Sensitivity: 2.8 mV

## 2020-10-13 NOTE — Progress Notes (Signed)
Remote pacemaker transmission.   

## 2020-10-24 DIAGNOSIS — J00 Acute nasopharyngitis [common cold]: Secondary | ICD-10-CM | POA: Diagnosis not present

## 2020-11-01 DIAGNOSIS — Z20822 Contact with and (suspected) exposure to covid-19: Secondary | ICD-10-CM | POA: Diagnosis not present

## 2020-11-01 DIAGNOSIS — Z1152 Encounter for screening for COVID-19: Secondary | ICD-10-CM | POA: Diagnosis not present

## 2020-11-01 DIAGNOSIS — R059 Cough, unspecified: Secondary | ICD-10-CM | POA: Diagnosis not present

## 2020-11-05 DIAGNOSIS — M159 Polyosteoarthritis, unspecified: Secondary | ICD-10-CM | POA: Diagnosis not present

## 2020-11-05 DIAGNOSIS — E785 Hyperlipidemia, unspecified: Secondary | ICD-10-CM | POA: Diagnosis not present

## 2020-11-05 DIAGNOSIS — I10 Essential (primary) hypertension: Secondary | ICD-10-CM | POA: Diagnosis not present

## 2020-11-10 ENCOUNTER — Encounter: Payer: Self-pay | Admitting: Internal Medicine

## 2020-11-10 ENCOUNTER — Other Ambulatory Visit: Payer: Self-pay

## 2020-11-10 ENCOUNTER — Ambulatory Visit (INDEPENDENT_AMBULATORY_CARE_PROVIDER_SITE_OTHER): Payer: Medicare PPO | Admitting: Internal Medicine

## 2020-11-10 VITALS — BP 136/68 | HR 87 | Ht 74.0 in | Wt 218.0 lb

## 2020-11-10 DIAGNOSIS — I480 Paroxysmal atrial fibrillation: Secondary | ICD-10-CM | POA: Diagnosis not present

## 2020-11-10 DIAGNOSIS — Z950 Presence of cardiac pacemaker: Secondary | ICD-10-CM | POA: Diagnosis not present

## 2020-11-10 DIAGNOSIS — I495 Sick sinus syndrome: Secondary | ICD-10-CM

## 2020-11-10 DIAGNOSIS — I10 Essential (primary) hypertension: Secondary | ICD-10-CM | POA: Diagnosis not present

## 2020-11-10 MED ORDER — AZITHROMYCIN 250 MG PO TABS: ORAL_TABLET | ORAL | 0 refills | Status: DC

## 2020-11-10 NOTE — Patient Instructions (Addendum)
Medication Instructions:  Your physician has recommended you make the following change in your medication:   1.  START azithromycin 250 mg-  Take TWO tablets by mouth on day one.  Then take one tablet by mouth daily until gone.    Labwork: None ordered.  Testing/Procedures: None ordered.  Follow-Up: Your physician wants you to follow-up in: 6 months with Lewayne Bunting, MD or one of the following Advanced Practice Providers on your designated Care Team:    Gypsy Balsam, NP  Francis Dowse, PA-C  Casimiro Needle "Mardelle Matte" [place], [place]  Remote monitoring is used to monitor your Pacemaker from home. This monitoring reduces the number of office visits required to check your device to one time per year. It allows [place] to keep an eye on the functioning of your device to ensure it is working properly. You are scheduled for a device check from home on 01/04/2021. You may send your transmission at any time that day. If you have a wireless device, the transmission will be sent automatically. After your physician reviews your transmission, you will receive a postcard with your next transmission date.  Any Other Special Instructions Will Be Listed Below (If Applicable).  If you need a refill on your cardiac medications before your next appointment, please call your pharmacy.

## 2020-11-10 NOTE — Progress Notes (Addendum)
HPI Maurice Hall is referred today by his wife as I saw her yesterday and she noted that he had been feeling poorly. He is a pleasant 87 yo man with dyspnea and HTN and sinus node dysfunction s/p PPM insertion. He has had a heart cath which did not show obstructive CAD and a CPX test which demonstrated some deconditioning but normal for age. In the interim, he notes 2 weeks of feeling poorly with worsening sob and a cough. No fever or chills. Cough is non-productive. His weight is not up and he denies peripheral edema. He notes that after he takes his lasix he will urinate for several hours.  No Known Allergies   Current Outpatient Medications  Medication Sig Dispense Refill  . acetaminophen (TYLENOL) 500 MG tablet Take 1,500 mg by mouth daily.     Marland Kitchen donepezil (ARICEPT) 10 MG tablet Take 10 mg by mouth daily.     . dorzolamide-timolol (COSOPT) 22.3-6.8 MG/ML ophthalmic solution Place 1 drop into the right eye 2 (two) times daily.    . folic acid (FOLVITE) 1 MG tablet Take 1 mg by mouth daily.     . furosemide (LASIX) 40 MG tablet Take 1 tablet by mouth once daily 90 tablet 3  . losartan (COZAAR) 100 MG tablet Take 100 mg by mouth daily as needed (High blood pressure).     . Melatonin 10 MG TABS Take 10 mg by mouth at bedtime.     . methotrexate 50 MG/2ML injection Inject 20 mg into the skin every Thursday. 0.8 ml in stomach    . Misc Natural Products (OSTEO BI-FLEX ADV JOINT SHIELD PO) Take 3 tablets by mouth daily.    . Omega-3 Fatty Acids (FISH OIL) 1000 MG CAPS Take 3,000 mg by mouth daily.     . simvastatin (ZOCOR) 20 MG tablet Take 20 mg by mouth at bedtime.    . tamsulosin (FLOMAX) 0.4 MG CAPS capsule Take 0.4 mg by mouth daily.     . trazodone (DESYREL) 300 MG tablet Take 150 mg by mouth at bedtime.     Carlena Hurl 20 MG TABS tablet Take 1 tablet by mouth once daily 90 tablet 1   No current facility-administered medications for this visit.     Past Medical History:  Diagnosis  Date  . Arthritis   . Dyslipidemia   . GERD (gastroesophageal reflux disease)   . HTN (hypertension)   . Paroxysmal atrial fibrillation (HCC)   . Symptomatic bradycardia    a. s/p MDT dual chamber PPM    ROS:   All systems reviewed and negative except as noted in the HPI.   Past Surgical History:  Procedure Laterality Date  . APPENDECTOMY  1946  . EP IMPLANTABLE DEVICE N/A 05/21/2015   Procedure:  PPM Generator Changeout;  Surgeon: Marinus Maw, MD;  Location: [facility] INVASIVE CV LAB;  Service: Cardiovascular;  Laterality: N/A;  . EYE SURGERY  02/01/2007,09/02/2007   left eye, right eye  . right knee cartilage removed  [id number]  . right shoulder  08/26/1990   arthroscopy  . RIGHT/LEFT HEART CATH AND CORONARY ANGIOGRAPHY N/A 12/08/2019   Procedure: RIGHT/LEFT HEART CATH AND CORONARY ANGIOGRAPHY;  Surgeon: Yvonne Kendall, MD;  Location: [facility] INVASIVE CV LAB;  Service: Cardiovascular;  Laterality: N/A;  . S/P PPM (Medtronic Sigma DDD  12/03  . STERIOD INJECTION  12/11/2011   Procedure: STEROID INJECTION;  Surgeon: Loanne Drilling, MD;  Location: [facility] ORS;  Service: Orthopedics;  Laterality: Left;  . TONSILLECTOMY     age 62  . TOTAL KNEE ARTHROPLASTY  12/11/2011   Procedure: TOTAL KNEE ARTHROPLASTY;  Surgeon: Loanne Drilling, MD;  Location: [facility] OR[facility];  Service: Orthopedics;  Laterality: Right;  Marland Kitchen VASECTOMY  1968  . vocal cord growth   02/1999   removed growth on vocal cords     Family History  Problem Relation Age of Onset  . Coronary artery disease Neg Hx      Social History   Socioeconomic History  . Marital status: Married    Spouse name: Not on file  . Number of children: Not on file  . Years of education: Not on file  . Highest education level: Not on file  Occupational History  . Not on file  Tobacco Use  . Smoking status: Former Smoker    Packs/day: 3.00    Years: 15.00    Pack years: 45.00    Types: Cigarettes    Quit date: 12/04/1963    Years since quitting: 56.9   . Smokeless tobacco: Never Used  Vaping Use  . Vaping Use: Never used  Substance and Sexual Activity  . Alcohol use: No  . Drug use: No    Comment: Rare ETOH   . Sexual activity: Not on file  Other Topics Concern  . Not on file  Social History Narrative  . Not on file   Social Determinants of Health   Financial Resource Strain: Not on file  Food Insecurity: Not on file  Transportation Needs: Not on file  Physical Activity: Not on file  Stress: Not on file  Social Connections: Not on file  Intimate Partner Violence: Not on file     BP 136/68   Pulse 87   Ht 6\' 2"  (1.88 m)   Wt 218 lb (98.9 kg)   BMI 27.99 kg/m   Physical Exam:  Chronicaly ill appearing NAD HEENT: Unremarkable Neck:  No JVD, no thyromegally Lymphatics:  No adenopathy Back:  No CVA tenderness Lungs:  Clear with no wheezes; scattered rales in left base. Question of egophony.  HEART:  Regular rate rhythm, no murmurs, no rubs, no clicks Abd:  soft, positive bowel sounds, no organomegally, no rebound, no guarding Ext:  2 plus pulses, no edema, no cyanosis, no clubbing Skin:  No rashes no nodules Neuro:  CN II through XII intact, motor grossly intact  EKG - nsr with atrial pacing  DEVICE  Normal device function.  See PaceArt for details.   Assess/Plan: 1. Cough/malaise/sob - I worry about pneumonia. I have prescribed him a Z-pack. If he is not better in a week, I have asked him to call his primary MD 2. Chronic diastolic heart failure - he will continue lasix. I encouraged him to avoid salty foods. 3. Sinus node dysfunction - he is asymptomatic s/p PPM. Underlying HR is in the low 40's/high 30's.  4. PPM - his medtronic DDD PM is working normally. We will recheck in several months  Krystalyn Kubota,MD

## 2020-11-18 DIAGNOSIS — M47816 Spondylosis without myelopathy or radiculopathy, lumbar region: Secondary | ICD-10-CM | POA: Diagnosis not present

## 2020-11-24 DIAGNOSIS — Z6828 Body mass index (BMI) 28.0-28.9, adult: Secondary | ICD-10-CM | POA: Diagnosis not present

## 2020-11-24 DIAGNOSIS — E663 Overweight: Secondary | ICD-10-CM | POA: Diagnosis not present

## 2020-11-24 DIAGNOSIS — M150 Primary generalized (osteo)arthritis: Secondary | ICD-10-CM | POA: Diagnosis not present

## 2020-11-24 DIAGNOSIS — L4050 Arthropathic psoriasis, unspecified: Secondary | ICD-10-CM | POA: Diagnosis not present

## 2020-11-24 DIAGNOSIS — G603 Idiopathic progressive neuropathy: Secondary | ICD-10-CM | POA: Diagnosis not present

## 2020-11-24 DIAGNOSIS — R5382 Chronic fatigue, unspecified: Secondary | ICD-10-CM | POA: Diagnosis not present

## 2020-11-24 DIAGNOSIS — Z79899 Other long term (current) drug therapy: Secondary | ICD-10-CM | POA: Diagnosis not present

## 2020-12-05 DIAGNOSIS — I10 Essential (primary) hypertension: Secondary | ICD-10-CM | POA: Diagnosis not present

## 2020-12-05 DIAGNOSIS — E785 Hyperlipidemia, unspecified: Secondary | ICD-10-CM | POA: Diagnosis not present

## 2020-12-05 DIAGNOSIS — M159 Polyosteoarthritis, unspecified: Secondary | ICD-10-CM | POA: Diagnosis not present

## 2020-12-14 DIAGNOSIS — M47816 Spondylosis without myelopathy or radiculopathy, lumbar region: Secondary | ICD-10-CM | POA: Diagnosis not present

## 2021-01-04 ENCOUNTER — Ambulatory Visit (INDEPENDENT_AMBULATORY_CARE_PROVIDER_SITE_OTHER): Payer: Medicare PPO

## 2021-01-04 DIAGNOSIS — I495 Sick sinus syndrome: Secondary | ICD-10-CM

## 2021-01-04 LAB — CUP PACEART REMOTE DEVICE CHECK
Battery Remaining Longevity: 44 mo
Battery Voltage: 2.97 V
Brady Statistic AP VP Percent: 0.58 %
Brady Statistic AP VS Percent: 93.95 %
Brady Statistic AS VP Percent: 0.02 %
Brady Statistic AS VS Percent: 5.45 %
Brady Statistic RA Percent Paced: 93.78 %
Brady Statistic RV Percent Paced: 0.69 %
Date Time Interrogation Session: 20220531111255
Implantable Lead Implant Date: 20031201
Implantable Lead Implant Date: 20031201
Implantable Lead Location: 753859
Implantable Lead Location: 753860
Implantable Lead Model: 5076
Implantable Lead Model: 5076
Implantable Pulse Generator Implant Date: 20161014
Lead Channel Impedance Value: 323 Ohm
Lead Channel Impedance Value: 361 Ohm
Lead Channel Impedance Value: 456 Ohm
Lead Channel Impedance Value: 475 Ohm
Lead Channel Pacing Threshold Amplitude: 0.875 V
Lead Channel Pacing Threshold Amplitude: 1.375 V
Lead Channel Pacing Threshold Pulse Width: 0.4 ms
Lead Channel Pacing Threshold Pulse Width: 0.4 ms
Lead Channel Sensing Intrinsic Amplitude: 0.625 mV
Lead Channel Sensing Intrinsic Amplitude: 0.625 mV
Lead Channel Sensing Intrinsic Amplitude: 10.375 mV
Lead Channel Sensing Intrinsic Amplitude: 10.375 mV
Lead Channel Setting Pacing Amplitude: 2.5 V
Lead Channel Setting Pacing Amplitude: 2.75 V
Lead Channel Setting Pacing Pulse Width: 0.4 ms
Lead Channel Setting Sensing Sensitivity: 2.8 mV

## 2021-01-05 DIAGNOSIS — I10 Essential (primary) hypertension: Secondary | ICD-10-CM | POA: Diagnosis not present

## 2021-01-05 DIAGNOSIS — Z1152 Encounter for screening for COVID-19: Secondary | ICD-10-CM | POA: Diagnosis not present

## 2021-01-05 DIAGNOSIS — Z6829 Body mass index (BMI) 29.0-29.9, adult: Secondary | ICD-10-CM | POA: Diagnosis not present

## 2021-01-05 DIAGNOSIS — M159 Polyosteoarthritis, unspecified: Secondary | ICD-10-CM | POA: Diagnosis not present

## 2021-01-05 DIAGNOSIS — E785 Hyperlipidemia, unspecified: Secondary | ICD-10-CM | POA: Diagnosis not present

## 2021-01-05 DIAGNOSIS — R053 Chronic cough: Secondary | ICD-10-CM | POA: Diagnosis not present

## 2021-01-05 DIAGNOSIS — Z20822 Contact with and (suspected) exposure to covid-19: Secondary | ICD-10-CM | POA: Diagnosis not present

## 2021-01-05 DIAGNOSIS — J029 Acute pharyngitis, unspecified: Secondary | ICD-10-CM | POA: Diagnosis not present

## 2021-01-06 DIAGNOSIS — J3489 Other specified disorders of nose and nasal sinuses: Secondary | ICD-10-CM | POA: Diagnosis not present

## 2021-01-06 DIAGNOSIS — R059 Cough, unspecified: Secondary | ICD-10-CM | POA: Diagnosis not present

## 2021-01-06 DIAGNOSIS — Z8701 Personal history of pneumonia (recurrent): Secondary | ICD-10-CM | POA: Diagnosis not present

## 2021-01-06 DIAGNOSIS — Z974 Presence of external hearing-aid: Secondary | ICD-10-CM | POA: Diagnosis not present

## 2021-01-06 DIAGNOSIS — J342 Deviated nasal septum: Secondary | ICD-10-CM | POA: Diagnosis not present

## 2021-01-06 DIAGNOSIS — J310 Chronic rhinitis: Secondary | ICD-10-CM | POA: Diagnosis not present

## 2021-01-06 DIAGNOSIS — K143 Hypertrophy of tongue papillae: Secondary | ICD-10-CM | POA: Diagnosis not present

## 2021-01-06 DIAGNOSIS — H9193 Unspecified hearing loss, bilateral: Secondary | ICD-10-CM | POA: Diagnosis not present

## 2021-01-06 DIAGNOSIS — H6123 Impacted cerumen, bilateral: Secondary | ICD-10-CM | POA: Diagnosis not present

## 2021-01-17 ENCOUNTER — Other Ambulatory Visit: Payer: Self-pay | Admitting: Internal Medicine

## 2021-01-17 DIAGNOSIS — M47816 Spondylosis without myelopathy or radiculopathy, lumbar region: Secondary | ICD-10-CM | POA: Diagnosis not present

## 2021-01-17 NOTE — Telephone Encounter (Signed)
Pt's age 87, wt 98.9 kg, SCr 0.99, CrCl 73.54, last ov w/ GT 11/10/20.

## 2021-01-24 DIAGNOSIS — R7303 Prediabetes: Secondary | ICD-10-CM | POA: Diagnosis not present

## 2021-01-24 DIAGNOSIS — E785 Hyperlipidemia, unspecified: Secondary | ICD-10-CM | POA: Diagnosis not present

## 2021-01-27 NOTE — Progress Notes (Signed)
Remote pacemaker transmission.   

## 2021-02-04 DIAGNOSIS — M159 Polyosteoarthritis, unspecified: Secondary | ICD-10-CM | POA: Diagnosis not present

## 2021-02-04 DIAGNOSIS — E785 Hyperlipidemia, unspecified: Secondary | ICD-10-CM | POA: Diagnosis not present

## 2021-02-04 DIAGNOSIS — I10 Essential (primary) hypertension: Secondary | ICD-10-CM | POA: Diagnosis not present

## 2021-02-09 DIAGNOSIS — R7303 Prediabetes: Secondary | ICD-10-CM | POA: Diagnosis not present

## 2021-02-09 DIAGNOSIS — E785 Hyperlipidemia, unspecified: Secondary | ICD-10-CM | POA: Diagnosis not present

## 2021-02-09 DIAGNOSIS — R053 Chronic cough: Secondary | ICD-10-CM | POA: Diagnosis not present

## 2021-02-09 DIAGNOSIS — I10 Essential (primary) hypertension: Secondary | ICD-10-CM | POA: Diagnosis not present

## 2021-02-15 ENCOUNTER — Institutional Professional Consult (permissible substitution): Payer: Medicare PPO | Admitting: Pulmonary Disease

## 2021-02-18 DIAGNOSIS — Z6828 Body mass index (BMI) 28.0-28.9, adult: Secondary | ICD-10-CM | POA: Diagnosis not present

## 2021-02-18 DIAGNOSIS — I10 Essential (primary) hypertension: Secondary | ICD-10-CM | POA: Diagnosis not present

## 2021-02-18 DIAGNOSIS — M5136 Other intervertebral disc degeneration, lumbar region: Secondary | ICD-10-CM | POA: Diagnosis not present

## 2021-03-04 ENCOUNTER — Encounter: Payer: Self-pay | Admitting: Pulmonary Disease

## 2021-03-04 ENCOUNTER — Other Ambulatory Visit: Payer: Self-pay

## 2021-03-04 ENCOUNTER — Ambulatory Visit (INDEPENDENT_AMBULATORY_CARE_PROVIDER_SITE_OTHER): Payer: Medicare PPO

## 2021-03-04 ENCOUNTER — Ambulatory Visit: Payer: Medicare PPO | Admitting: Pulmonary Disease

## 2021-03-04 VITALS — BP 138/70 | HR 64 | Temp 97.8°F | Ht 74.0 in | Wt 222.2 lb

## 2021-03-04 DIAGNOSIS — R053 Chronic cough: Secondary | ICD-10-CM

## 2021-03-04 MED ORDER — DOXYCYCLINE HYCLATE 100 MG PO TABS: 100.0000 mg | ORAL_TABLET | Freq: Two times a day (BID) | ORAL | 0 refills | Status: DC

## 2021-03-04 MED ORDER — PREDNISONE 10 MG PO TABS: 10.0000 mg | ORAL_TABLET | Freq: Two times a day (BID) | ORAL | 0 refills | Status: DC

## 2021-03-04 NOTE — Patient Instructions (Signed)
Obtain a chest x-ray  Prescription for doxycycline Prescription for course of prednisone  Nasal steroid-Flonase or Nasonex 2 sprays each nostril daily or 1 spray twice a day  Regular exercises as tolerated  Pain control with Tylenol or Motrin  I will see you back in 4 to 6 weeks  Call with significant concerns

## 2021-03-04 NOTE — Progress Notes (Signed)
Maurice Hall    [id number]    October 27, 1930  Primary Care Physician:Hodges, Para March, MD  Referring Physician: Charlott Rakes, MD [address] [place],  [place] [address]  Chief complaint:   Patient being seen for cough  HPI:  Cough is lasted about 3 to 4 months There was concern for reflux for which he was treated  No previous history of lung disease  Quit smoking 1965  Has a history of congestive heart failure  He does have nasal stuffiness and congestion, postnasal drip Sounds congested a lot of times He does wheeze sometimes especially in the evenings  No chest pains or chest discomfort Cough is nonproductive but he does sound congested  Retired Runner, broadcasting/film/video  Less active recently because of musculoskeletal pain and discomfort  Outpatient Encounter Medications as of 03/04/2021  Medication Sig   acetaminophen (TYLENOL) 500 MG tablet Take 1,500 mg by mouth daily.    donepezil (ARICEPT) 10 MG tablet Take 10 mg by mouth daily.    dorzolamide-timolol (COSOPT) 22.3-6.8 MG/ML ophthalmic solution Place 1 drop into the right eye 2 (two) times daily.   folic acid (FOLVITE) 1 MG tablet Take 1 mg by mouth daily.    furosemide (LASIX) 40 MG tablet Take 1 tablet by mouth once daily   losartan (COZAAR) 100 MG tablet Take 100 mg by mouth daily as needed (High blood pressure).    Melatonin 10 MG TABS Take 10 mg by mouth at bedtime.    methotrexate 50 MG/2ML injection Inject 20 mg into the skin every Thursday. 0.8 ml in stomach   Misc Natural Products (OSTEO BI-FLEX ADV JOINT SHIELD PO) Take 3 tablets by mouth daily.   Omega-3 Fatty Acids (FISH OIL) 1000 MG CAPS Take 3,000 mg by mouth daily.    simvastatin (ZOCOR) 20 MG tablet Take 20 mg by mouth at bedtime.   tamsulosin (FLOMAX) 0.4 MG CAPS capsule Take 0.4 mg by mouth daily.    trazodone (DESYREL) 300 MG tablet Take 150 mg by mouth at bedtime.    XARELTO 20 MG TABS tablet Take 1 tablet by mouth once daily    azithromycin (ZITHROMAX Z-PAK) 250 MG tablet Take 2 tablets by mouth on day one.  Then take one tablet by mouth daily until gone. (Patient not taking: Reported on 03/04/2021)   No facility-administered encounter medications on file as of 03/04/2021.    Allergies as of 03/04/2021   (No Known Allergies)    Past Medical History:  Diagnosis Date   Arthritis    Dyslipidemia    GERD (gastroesophageal reflux disease)    HTN (hypertension)    Paroxysmal atrial fibrillation (HCC)    Symptomatic bradycardia    a. s/p MDT dual chamber PPM    Past Surgical History:  Procedure Laterality Date   APPENDECTOMY  1946   EP IMPLANTABLE DEVICE N/A 05/21/2015   Procedure:  PPM Generator Changeout;  Surgeon: Marinus Maw, MD;  Location: [facility] INVASIVE CV LAB;  Service: Cardiovascular;  Laterality: N/A;   EYE SURGERY  02/01/2007,09/02/2007   left eye, right eye   right knee cartilage removed  1975,1999   right shoulder  08/26/1990   arthroscopy   RIGHT/LEFT HEART CATH AND CORONARY ANGIOGRAPHY N/A 12/08/2019   Procedure: RIGHT/LEFT HEART CATH AND CORONARY ANGIOGRAPHY;  Surgeon: Yvonne Kendall, MD;  Location: [facility] INVASIVE CV LAB;  Service: Cardiovascular;  Laterality: N/A;   S/P PPM (Medtronic Sigma DDD  12/03   STERIOD  INJECTION  12/11/2011   Procedure: STEROID INJECTION;  Surgeon: Loanne Drilling, MD;  Location: [facility];  Service: Orthopedics;  Laterality: Left;   TONSILLECTOMY     age 40   TOTAL KNEE ARTHROPLASTY  12/11/2011   Procedure: TOTAL KNEE ARTHROPLASTY;  Surgeon: Loanne Drilling, MD;  Location: [facility];  Service: Orthopedics;  Laterality: Right;   VASECTOMY  1968   vocal cord growth   02/1999   removed growth on vocal cords    Family History  Problem Relation Age of Onset   Coronary artery disease Neg Hx     Social History   Socioeconomic History   Marital status: Married    Spouse name: Not on file   Number of children: Not on file   Years of education: Not on file   Highest  education level: Not on file  Occupational History   Not on file  Tobacco Use   Smoking status: Former    Packs/day: 3.00    Years: 15.00    Pack years: 45.00    Types: Cigarettes    Start date: 73    Quit date: 12/04/1963    Years since quitting: 57.2   Smokeless tobacco: Never  Vaping Use   Vaping Use: Never used  Substance and Sexual Activity   Alcohol use: No   Drug use: No    Comment: Rare ETOH    Sexual activity: Not on file  Other Topics Concern   Not on file  Social History Narrative   Not on file   Social Determinants of Health   Financial Resource Strain: Not on file  Food Insecurity: Not on file  Transportation Needs: Not on file  Physical Activity: Not on file  Stress: Not on file  Social Connections: Not on file  Intimate Partner Violence: Not on file    Review of Systems  Constitutional:  Positive for fatigue.  Respiratory:  Positive for shortness of breath and wheezing.   Musculoskeletal:  Positive for arthralgias.   Vitals:   03/04/21 1544  BP: 138/70  Pulse: 64  Temp: 97.8 F (36.6 C)  SpO2: 98%     Physical Exam HENT:     Head: Normocephalic.     Mouth/Throat:     Mouth: Mucous membranes are moist.  Eyes:     General:        Right eye: No discharge.        Left eye: No discharge.  Cardiovascular:     Rate and Rhythm: Normal rate and regular rhythm.     Heart sounds: No murmur heard.   No friction rub.  Pulmonary:     Effort: No respiratory distress.     Breath sounds: No stridor. No wheezing or rhonchi.  Musculoskeletal:     Cervical back: No rigidity or tenderness.  Neurological:     Mental Status: He is alert.     Cranial Nerves: No cranial nerve deficit.  Psychiatric:        Mood and Affect: Mood normal.   Data Reviewed: Chest x-ray today 03/04/2021 shows no acute infiltrate-reviewed by myself  Assessment:  Protracted bronchitis  Postnasal drip  Persistent cough and shortness of  breath  Plan/Recommendations: Prescription for doxycycline 100 p.o. twice daily for 7 days  Prednisone 10 p.o. twice daily for 7 days  Chest x-ray is unremarkable  Use of Flonase for nasal stuffiness and postnasal drip  Encouraged to exercise regularly as it may have become deconditioned lately  Encouraged to  call with any significant concerns  Follow-up in 4 to 6 weeks     Virl Diamond MD [facility] 03/04/2021, 4:33 PM  CC: Charlott Rakes, MD

## 2021-03-07 DIAGNOSIS — F0150 Vascular dementia without behavioral disturbance: Secondary | ICD-10-CM | POA: Diagnosis not present

## 2021-03-07 DIAGNOSIS — E785 Hyperlipidemia, unspecified: Secondary | ICD-10-CM | POA: Diagnosis not present

## 2021-03-07 DIAGNOSIS — I10 Essential (primary) hypertension: Secondary | ICD-10-CM | POA: Diagnosis not present

## 2021-03-16 ENCOUNTER — Institutional Professional Consult (permissible substitution): Payer: Medicare PPO | Admitting: Pulmonary Disease

## 2021-03-17 DIAGNOSIS — M5416 Radiculopathy, lumbar region: Secondary | ICD-10-CM | POA: Diagnosis not present

## 2021-04-05 ENCOUNTER — Ambulatory Visit (INDEPENDENT_AMBULATORY_CARE_PROVIDER_SITE_OTHER): Payer: Medicare PPO

## 2021-04-05 DIAGNOSIS — I495 Sick sinus syndrome: Secondary | ICD-10-CM | POA: Diagnosis not present

## 2021-04-05 LAB — CUP PACEART REMOTE DEVICE CHECK
Battery Remaining Longevity: 33 mo
Battery Voltage: 2.96 V
Brady Statistic AP VP Percent: 1.81 %
Brady Statistic AP VS Percent: 59.56 %
Brady Statistic AS VP Percent: 10.39 %
Brady Statistic AS VS Percent: 28.25 %
Brady Statistic RA Percent Paced: 50.87 %
Brady Statistic RV Percent Paced: 12.09 %
Date Time Interrogation Session: 20220830110010
Implantable Lead Implant Date: 20031201
Implantable Lead Implant Date: 20031201
Implantable Lead Location: 753859
Implantable Lead Location: 753860
Implantable Lead Model: 5076
Implantable Lead Model: 5076
Implantable Pulse Generator Implant Date: 20161014
Lead Channel Impedance Value: 323 Ohm
Lead Channel Impedance Value: 361 Ohm
Lead Channel Impedance Value: 475 Ohm
Lead Channel Impedance Value: 494 Ohm
Lead Channel Pacing Threshold Amplitude: 0.625 V
Lead Channel Pacing Threshold Amplitude: 1.5 V
Lead Channel Pacing Threshold Pulse Width: 0.4 ms
Lead Channel Pacing Threshold Pulse Width: 0.4 ms
Lead Channel Sensing Intrinsic Amplitude: 0.5 mV
Lead Channel Sensing Intrinsic Amplitude: 0.5 mV
Lead Channel Sensing Intrinsic Amplitude: 11 mV
Lead Channel Sensing Intrinsic Amplitude: 11 mV
Lead Channel Setting Pacing Amplitude: 2.5 V
Lead Channel Setting Pacing Amplitude: 3 V
Lead Channel Setting Pacing Pulse Width: 0.4 ms
Lead Channel Setting Sensing Sensitivity: 2.8 mV

## 2021-04-07 DIAGNOSIS — F0150 Vascular dementia without behavioral disturbance: Secondary | ICD-10-CM | POA: Diagnosis not present

## 2021-04-07 DIAGNOSIS — E785 Hyperlipidemia, unspecified: Secondary | ICD-10-CM | POA: Diagnosis not present

## 2021-04-07 DIAGNOSIS — I10 Essential (primary) hypertension: Secondary | ICD-10-CM | POA: Diagnosis not present

## 2021-04-13 ENCOUNTER — Other Ambulatory Visit: Payer: Self-pay

## 2021-04-13 ENCOUNTER — Ambulatory Visit: Payer: Medicare PPO | Admitting: Pulmonary Disease

## 2021-04-13 ENCOUNTER — Encounter: Payer: Self-pay | Admitting: Pulmonary Disease

## 2021-04-13 VITALS — BP 134/80 | HR 97 | Temp 97.7°F | Ht 74.0 in | Wt 219.6 lb

## 2021-04-13 DIAGNOSIS — R053 Chronic cough: Secondary | ICD-10-CM | POA: Diagnosis not present

## 2021-04-13 MED ORDER — PROMETHAZINE-CODEINE 6.25-10 MG/5ML PO SYRP: 5.0000 mL | ORAL_SOLUTION | Freq: Four times a day (QID) | ORAL | 0 refills | Status: DC | PRN

## 2021-04-13 MED ORDER — IPRATROPIUM BROMIDE 0.06 % NA SOLN: 2.0000 | Freq: Four times a day (QID) | NASAL | 6 refills | Status: DC

## 2021-04-13 NOTE — Patient Instructions (Addendum)
Atrovent nasal to help with nasal stuffiness and congestion  Medication specifically for the cough-promethazine with codeine  Schedule CT scan of the chest without contrast for chronic cough  I will see you back in about 4 to 6 weeks

## 2021-04-13 NOTE — Progress Notes (Signed)
Maurice Hall    [id number]    10-09-1930  Primary Care Physician:Hodges, Para March, MD  Referring Physician: Charlott Rakes, MD [address] [place],  [place] [address]  Chief complaint:   Patient being seen for cough Cough is lasted about 5 months now  HPI:  Cough is lasted about 5 months Was prescribed steroids and antibiotics which did not seem to help Reflux medication did not help   No previous history of lung disease  Quit smoking 1965  Has a history of congestive heart failure  He does have nasal stuffiness and congestion, postnasal drip Sounds congested a lot of times He does wheeze sometimes especially in the evenings Flonase has not helped nasal congestion  No chest pains or chest discomfort Cough is nonproductive but he does sound congested  Retired Runner, broadcasting/film/video  Less active recently because of musculoskeletal pain and discomfort  Outpatient Encounter Medications as of 04/13/2021  Medication Sig   acetaminophen (TYLENOL) 500 MG tablet Take 1,500 mg by mouth daily.    azithromycin (ZITHROMAX Z-PAK) 250 MG tablet Take 2 tablets by mouth on day one.  Then take one tablet by mouth daily until gone. (Patient taking differently: Take 2 tablets by mouth on day one.  Then take one tablet by mouth daily until gone.)   donepezil (ARICEPT) 10 MG tablet Take 10 mg by mouth daily.    dorzolamide-timolol (COSOPT) 22.3-6.8 MG/ML ophthalmic solution Place 1 drop into the right eye 2 (two) times daily.   doxycycline (VIBRA-TABS) 100 MG tablet Take 1 tablet (100 mg total) by mouth 2 (two) times daily.   folic acid (FOLVITE) 1 MG tablet Take 1 mg by mouth daily.    furosemide (LASIX) 40 MG tablet Take 1 tablet by mouth once daily   losartan (COZAAR) 100 MG tablet Take 100 mg by mouth daily as needed (High blood pressure).    Melatonin 10 MG TABS Take 10 mg by mouth at bedtime.    methotrexate 50 MG/2ML injection Inject 20 mg into the skin every Thursday.  0.8 ml in stomach   Misc Natural Products (OSTEO BI-FLEX ADV JOINT SHIELD PO) Take 3 tablets by mouth daily.   Omega-3 Fatty Acids (FISH OIL) 1000 MG CAPS Take 3,000 mg by mouth daily.    predniSONE (DELTASONE) 10 MG tablet Take 1 tablet (10 mg total) by mouth 2 (two) times daily with a meal.   simvastatin (ZOCOR) 20 MG tablet Take 20 mg by mouth at bedtime.   tamsulosin (FLOMAX) 0.4 MG CAPS capsule Take 0.4 mg by mouth daily.    trazodone (DESYREL) 300 MG tablet Take 150 mg by mouth at bedtime.    XARELTO 20 MG TABS tablet Take 1 tablet by mouth once daily   No facility-administered encounter medications on file as of 04/13/2021.    Allergies as of 04/13/2021   (No Known Allergies)    Past Medical History:  Diagnosis Date   Arthritis    Dyslipidemia    GERD (gastroesophageal reflux disease)    HTN (hypertension)    Paroxysmal atrial fibrillation (HCC)    Symptomatic bradycardia    a. s/p MDT dual chamber PPM    Past Surgical History:  Procedure Laterality Date   APPENDECTOMY  1946   EP IMPLANTABLE DEVICE N/A 05/21/2015   Procedure:  PPM Generator Changeout;  Surgeon: Marinus Maw, MD;  Location: [facility] INVASIVE CV LAB;  Service: Cardiovascular;  Laterality: N/A;   EYE  SURGERY  02/01/2007,09/02/2007   left eye, right eye   right knee cartilage removed  1975,1999   right shoulder  08/26/1990   arthroscopy   RIGHT/LEFT HEART CATH AND CORONARY ANGIOGRAPHY N/A 12/08/2019   Procedure: RIGHT/LEFT HEART CATH AND CORONARY ANGIOGRAPHY;  Surgeon: Yvonne Kendall, MD;  Location: [facility] LAB;  Service: Cardiovascular;  Laterality: N/A;   S/P PPM (Medtronic Sigma DDD  12/03   STERIOD INJECTION  12/11/2011   Procedure: STEROID INJECTION;  Surgeon: Loanne Drilling, MD;  Location: [facility];  Service: Orthopedics;  Laterality: Left;   TONSILLECTOMY     age 31   TOTAL KNEE ARTHROPLASTY  12/11/2011   Procedure: TOTAL KNEE ARTHROPLASTY;  Surgeon: Loanne Drilling, MD;  Location: [facility];  Service:  Orthopedics;  Laterality: Right;   VASECTOMY  1968   vocal cord growth   02/1999   removed growth on vocal cords    Family History  Problem Relation Age of Onset   Coronary artery disease Neg Hx     Social History   Socioeconomic History   Marital status: Married    Spouse name: Not on file   Number of children: Not on file   Years of education: Not on file   Highest education level: Not on file  Occupational History   Not on file  Tobacco Use   Smoking status: Former    Packs/day: 3.00    Years: 15.00    Pack years: 45.00    Types: Cigarettes    Start date: 31    Quit date: 12/04/1963    Years since quitting: 57.3   Smokeless tobacco: Never  Vaping Use   Vaping Use: Never used  Substance and Sexual Activity   Alcohol use: No   Drug use: No    Comment: Rare ETOH    Sexual activity: Not on file  Other Topics Concern   Not on file  Social History Narrative   Not on file   Social Determinants of Health   Financial Resource Strain: Not on file  Food Insecurity: Not on file  Transportation Needs: Not on file  Physical Activity: Not on file  Stress: Not on file  Social Connections: Not on file  Intimate Partner Violence: Not on file    Review of Systems  Constitutional:  Positive for fatigue.  HENT:  Positive for congestion, postnasal drip and rhinorrhea.   Respiratory:  Positive for shortness of breath and wheezing.   Musculoskeletal:  Positive for arthralgias.   Vitals:   04/13/21 1504  BP: 134/80  Pulse: 97  Temp: 97.7 F (36.5 C)  SpO2: 97%     Physical Exam HENT:     Head: Normocephalic.     Mouth/Throat:     Mouth: Mucous membranes are moist.  Eyes:     General:        Right eye: No discharge.        Left eye: No discharge.  Cardiovascular:     Rate and Rhythm: Normal rate and regular rhythm.     Heart sounds: No murmur heard.   No friction rub.  Pulmonary:     Effort: No respiratory distress.     Breath sounds: No stridor. No  wheezing or rhonchi.  Musculoskeletal:     Cervical back: No rigidity or tenderness.  Neurological:     Mental Status: He is alert.     Cranial Nerves: No cranial nerve deficit.  Psychiatric:  Mood and Affect: Mood normal.   Data Reviewed: Chest x-ray today 03/04/2021-shows no acute infiltrate Prominent bronchovascular markings  Assessment:  Protracted bronchitis -No underlying obstructive lung disease known -Failed treatment with steroids and antibiotics  Postnasal drip -Persistent despite use of Flonase  Persistent cough and shortness of breath  Plan/Recommendations: Atrovent nasal  Promethazine with codeine for cough  Obtain CT scan of the chest without contrast  Continue Flonase  Encouraged to exercise regularly as it may have become deconditioned lately  Encouraged to call with significant concerns  I spent 30 minutes dedicated to the care of this patient on the date of this encounter to include previsit review of records, face-to-face time with the patient discussing conditions above, post visit ordering of testing, clinical documentation with electronic health record and communicated necessary findings to members of the patient's care team   Virl Diamond MD [facility] 04/13/2021, 3:15 PM  CC: Charlott Rakes, MD

## 2021-04-14 ENCOUNTER — Other Ambulatory Visit: Payer: Self-pay | Admitting: Internal Medicine

## 2021-04-14 NOTE — Telephone Encounter (Signed)
Pt last saw Dr Ladona Ridgel 11/10/20, last labs 01/24/21 Creat 1.05 at [facility] per [facility], age 87, weight 99.6kg, CrCl 69.83, based on CrCl pt is on appropriate dosage of Xarelto 20mg  QD.  Will refill rx.

## 2021-04-19 NOTE — Progress Notes (Signed)
Remote pacemaker transmission.   

## 2021-04-20 ENCOUNTER — Telehealth: Payer: Self-pay

## 2021-04-20 NOTE — Telephone Encounter (Signed)
The called to get help with monitor. [employer] tech support calling the patient in 30 minutes.

## 2021-04-20 NOTE — Telephone Encounter (Signed)
I let the patient wife know we got her voicemail and transmission. She was very appreciative and thanked me for my help. She states everyone including the medtronic rep was very nice and helpful.

## 2021-05-02 ENCOUNTER — Ambulatory Visit
Admission: RE | Admit: 2021-05-02 | Discharge: 2021-05-02 | Disposition: A | Discharge status: Home / Self Care | Payer: Medicare PPO | Source: Ambulatory Visit | Attending: Pulmonary Disease | Admitting: Pulmonary Disease

## 2021-05-02 DIAGNOSIS — I700 Atherosclerosis of aorta: Secondary | ICD-10-CM | POA: Diagnosis not present

## 2021-05-02 DIAGNOSIS — R0602 Shortness of breath: Secondary | ICD-10-CM | POA: Diagnosis not present

## 2021-05-02 DIAGNOSIS — R911 Solitary pulmonary nodule: Secondary | ICD-10-CM | POA: Diagnosis not present

## 2021-05-02 DIAGNOSIS — J449 Chronic obstructive pulmonary disease, unspecified: Secondary | ICD-10-CM | POA: Diagnosis not present

## 2021-05-02 DIAGNOSIS — J439 Emphysema, unspecified: Secondary | ICD-10-CM | POA: Diagnosis not present

## 2021-05-02 DIAGNOSIS — R053 Chronic cough: Secondary | ICD-10-CM | POA: Diagnosis not present

## 2021-05-02 DIAGNOSIS — J9811 Atelectasis: Secondary | ICD-10-CM | POA: Diagnosis not present

## 2021-05-02 DIAGNOSIS — J441 Chronic obstructive pulmonary disease with (acute) exacerbation: Secondary | ICD-10-CM | POA: Diagnosis not present

## 2021-05-03 ENCOUNTER — Telehealth: Payer: Self-pay | Admitting: Pulmonary Disease

## 2021-05-03 NOTE — Telephone Encounter (Signed)
Pts wife is calling in regards to the CT results that came up in [place].   The CT was done yesterday and they are both aware that it can take up to 4 days for the provider to be in contact with them about these results.  Will forward to AO for recs.  Thanks

## 2021-05-06 ENCOUNTER — Telehealth: Payer: Self-pay | Admitting: Pulmonary Disease

## 2021-05-06 NOTE — Telephone Encounter (Signed)
Call made to patient, confirmed DOB. Made aware AO returns Monday and we will f/u next week. Voiced understanding.   AO please advise on CT results. Thanks

## 2021-05-07 DIAGNOSIS — I10 Essential (primary) hypertension: Secondary | ICD-10-CM | POA: Diagnosis not present

## 2021-05-07 DIAGNOSIS — E785 Hyperlipidemia, unspecified: Secondary | ICD-10-CM | POA: Diagnosis not present

## 2021-05-07 DIAGNOSIS — M159 Polyosteoarthritis, unspecified: Secondary | ICD-10-CM | POA: Diagnosis not present

## 2021-05-09 MED ORDER — TRELEGY ELLIPTA 100-62.5-25 MCG/INH IN AEPB: 1.0000 | INHALATION_SPRAY | Freq: Every day | RESPIRATORY_TRACT | 1 refills | Status: DC

## 2021-05-09 NOTE — Telephone Encounter (Signed)
Did review CT  Evidence of bronchitis -Antibiotic should help-already given a course of antibiotics  With emphysema and bronchitis noted on the CT -Trial with an inhaler will be appropriate as this may help the cough longer-term  Prescription for Trelegy will be sent in-sent into [facility] -He should make sure he rinses his mouth following use regularly  For the lung nodule -It is very small-5 mm -We will repeat CT in 1 year  Follow-up as scheduled

## 2021-05-09 NOTE — Telephone Encounter (Signed)
Called and spoke with patient's wife Sallye Ober. She verbalized understanding of the results and instructions for Trelegy. I also reminded her of his appt that is scheduled for this Thursday.   Nothing further needed at time of call.

## 2021-05-12 ENCOUNTER — Other Ambulatory Visit: Payer: Self-pay

## 2021-05-12 ENCOUNTER — Ambulatory Visit: Payer: Medicare PPO | Admitting: Pulmonary Disease

## 2021-05-12 ENCOUNTER — Encounter: Payer: Self-pay | Admitting: Pulmonary Disease

## 2021-05-12 VITALS — BP 128/70 | HR 76 | Temp 97.8°F | Ht 74.0 in | Wt 230.2 lb

## 2021-05-12 DIAGNOSIS — R053 Chronic cough: Secondary | ICD-10-CM | POA: Diagnosis not present

## 2021-05-12 NOTE — Progress Notes (Signed)
Maurice Hall    [id number]    [birth year]  Primary Care Physician:Hodges, Para March, MD  Referring Physician: Charlott Rakes, MD [address] [place],  [place] [address]  Chief complaint:   Patient being seen for cough  HPI:  Over 5 months of cough and Used steroids antibiotics  Started Trelegy about 2 days ago following the CT scan of the chest showing emphysema, lung nodule at the base of the lung  Reformed smoker, quit in 1965  Has a history of congestive heart failure  He does have nasal stuffiness and congestion, postnasal drip Sounds congested a lot of times He does wheeze sometimes especially in the evenings Flonase has not helped nasal congestion  No chest pains or chest discomfort Cough is nonproductive but he does sound congested  Retired Runner, broadcasting/film/video  Less active recently because of musculoskeletal pain and discomfort  Outpatient Encounter Medications as of 04/13/2021  Medication Sig   acetaminophen (TYLENOL) 500 MG tablet Take 1,500 mg by mouth daily.    azithromycin (ZITHROMAX Z-PAK) 250 MG tablet Take 2 tablets by mouth on day one.  Then take one tablet by mouth daily until gone. (Patient taking differently: Take 2 tablets by mouth on day one.  Then take one tablet by mouth daily until gone.)   donepezil (ARICEPT) 10 MG tablet Take 10 mg by mouth daily.    dorzolamide-timolol (COSOPT) 22.3-6.8 MG/ML ophthalmic solution Place 1 drop into the right eye 2 (two) times daily.   doxycycline (VIBRA-TABS) 100 MG tablet Take 1 tablet (100 mg total) by mouth 2 (two) times daily.   folic acid (FOLVITE) 1 MG tablet Take 1 mg by mouth daily.    furosemide (LASIX) 40 MG tablet Take 1 tablet by mouth once daily   losartan (COZAAR) 100 MG tablet Take 100 mg by mouth daily as needed (High blood pressure).    Melatonin 10 MG TABS Take 10 mg by mouth at bedtime.    methotrexate 50 MG/2ML injection Inject 20 mg into the skin every Thursday. 0.8 ml in stomach    Misc Natural Products (OSTEO BI-FLEX ADV JOINT SHIELD PO) Take 3 tablets by mouth daily.   Omega-3 Fatty Acids (FISH OIL) 1000 MG CAPS Take 3,000 mg by mouth daily.    predniSONE (DELTASONE) 10 MG tablet Take 1 tablet (10 mg total) by mouth 2 (two) times daily with a meal.   simvastatin (ZOCOR) 20 MG tablet Take 20 mg by mouth at bedtime.   tamsulosin (FLOMAX) 0.4 MG CAPS capsule Take 0.4 mg by mouth daily.    trazodone (DESYREL) 300 MG tablet Take 150 mg by mouth at bedtime.    XARELTO 20 MG TABS tablet Take 1 tablet by mouth once daily   No facility-administered encounter medications on file as of 04/13/2021.    Allergies as of 04/13/2021   (No Known Allergies)    Past Medical History:  Diagnosis Date   Arthritis    Dyslipidemia    GERD (gastroesophageal reflux disease)    HTN (hypertension)    Paroxysmal atrial fibrillation (HCC)    Symptomatic bradycardia    a. s/p MDT dual chamber PPM    Past Surgical History:  Procedure Laterality Date   APPENDECTOMY  1946   EP IMPLANTABLE DEVICE N/A 05/21/2015   Procedure:  PPM Generator Changeout;  Surgeon: Marinus Maw, MD;  Location: [facility];  Service: Cardiovascular;  Laterality: N/A;   EYE SURGERY  02/01/2007,09/02/2007   left eye, right eye   right knee cartilage removed  1975,1999   right shoulder  08/26/1990   arthroscopy   RIGHT/LEFT HEART CATH AND CORONARY ANGIOGRAPHY N/A 12/08/2019   Procedure: RIGHT/LEFT HEART CATH AND CORONARY ANGIOGRAPHY;  Surgeon: Yvonne Kendall, MD;  Location: [facility];  Service: Cardiovascular;  Laterality: N/A;   S/P PPM (Medtronic Sigma DDD  12/03   STERIOD INJECTION  12/11/2011   Procedure: STEROID INJECTION;  Surgeon: Loanne Drilling, MD;  Location: [facility];  Service: Orthopedics;  Laterality: Left;   TONSILLECTOMY     age 89   TOTAL KNEE ARTHROPLASTY  12/11/2011   Procedure: TOTAL KNEE ARTHROPLASTY;  Surgeon: Loanne Drilling, MD;  Location: [facility];  Service: Orthopedics;   Laterality: Right;   VASECTOMY  1968   vocal cord growth   02/1999   removed growth on vocal cords    Family History  Problem Relation Age of Onset   Coronary artery disease Neg Hx     Social History   Socioeconomic History   Marital status: Married    Spouse name: Not on file   Number of children: Not on file   Years of education: Not on file   Highest education level: Not on file  Occupational History   Not on file  Tobacco Use   Smoking status: Former    Packs/day: 3.00    Years: 15.00    Pack years: 45.00    Types: Cigarettes    Start date: 58    Quit date: 12/04/1963    Years since quitting: 57.3   Smokeless tobacco: Never  Vaping Use   Vaping Use: Never used  Substance and Sexual Activity   Alcohol use: No   Drug use: No    Comment: Rare ETOH    Sexual activity: Not on file  Other Topics Concern   Not on file  Social History Narrative   Not on file   Social Determinants of Health   Financial Resource Strain: Not on file  Food Insecurity: Not on file  Transportation Needs: Not on file  Physical Activity: Not on file  Stress: Not on file  Social Connections: Not on file  Intimate Partner Violence: Not on file    Review of Systems  Constitutional:  Positive for fatigue.  HENT:  Positive for congestion, postnasal drip and rhinorrhea.   Respiratory:  Positive for shortness of breath and wheezing.   Musculoskeletal:  Positive for arthralgias.   Vitals:   04/13/21 1504  BP: 134/80  Pulse: 97  Temp: 97.7 F (36.5 C)  SpO2: 97%     Physical Exam HENT:     Head: Normocephalic.  Eyes:     General:        Right eye: No discharge.        Left eye: No discharge.  Cardiovascular:     Rate and Rhythm: Normal rate and regular rhythm.     Heart sounds: No murmur heard.   No friction rub.  Pulmonary:     Effort: No respiratory distress.     Breath sounds: No stridor. No wheezing or rhonchi.  Musculoskeletal:     Cervical back: No rigidity or  tenderness.  Neurological:     Mental Status: He is alert.     Cranial Nerves: No cranial nerve deficit.  Psychiatric:        Mood and Affect: Mood normal.   Data Reviewed: Chest x-ray today 03/04/2021-shows no acute infiltrate  Prominent bronchovascular markings  CT scan of the chest reveals areas of emphysema, a 5 mm left lower lobe nodule  Assessment:  Protracted bronchitis -Symptoms are better  Postnasal drip -Continue nasal steroids  Persistent cough and shortness of breath  Recently started on Trelegy for COPD/emphysema  Plan/Recommendations: Continue Trelegy  Cough medicine as needed  Repeat CT scan of the chest in a year for lung nodule  Continue Flonase  Regular exercise as tolerated  Tentative follow-up in 6 months   Virl Diamond MD [facility] 04/13/2021, 3:15 PM  CC: Charlott Rakes, MD

## 2021-05-12 NOTE — Patient Instructions (Signed)
I will see you in 6 months  Continue Trelegy  We will get a CT scan of the chest in about a year  Call with significant concerns

## 2021-05-12 NOTE — Telephone Encounter (Signed)
Patient has been contacted regarding results. This is a duplicated encounter.   Nothing further needed at this time.

## 2021-05-12 NOTE — Telephone Encounter (Signed)
AO please advise on CT results. Thanks :) 

## 2021-05-18 DIAGNOSIS — R7303 Prediabetes: Secondary | ICD-10-CM | POA: Diagnosis not present

## 2021-05-18 DIAGNOSIS — E785 Hyperlipidemia, unspecified: Secondary | ICD-10-CM | POA: Diagnosis not present

## 2021-05-25 DIAGNOSIS — R7303 Prediabetes: Secondary | ICD-10-CM | POA: Diagnosis not present

## 2021-05-25 DIAGNOSIS — I10 Essential (primary) hypertension: Secondary | ICD-10-CM | POA: Diagnosis not present

## 2021-05-25 DIAGNOSIS — Z23 Encounter for immunization: Secondary | ICD-10-CM | POA: Diagnosis not present

## 2021-05-25 DIAGNOSIS — E785 Hyperlipidemia, unspecified: Secondary | ICD-10-CM | POA: Diagnosis not present

## 2021-05-25 DIAGNOSIS — Z7189 Other specified counseling: Secondary | ICD-10-CM | POA: Diagnosis not present

## 2021-05-25 DIAGNOSIS — I482 Chronic atrial fibrillation, unspecified: Secondary | ICD-10-CM | POA: Diagnosis not present

## 2021-05-30 ENCOUNTER — Telehealth: Payer: Self-pay | Admitting: *Deleted

## 2021-05-30 NOTE — Telephone Encounter (Signed)
Will route to PharmD for rec's re: holding anticoagulation. Tereso Newcomer, PA-C    05/30/2021 5:16 PM

## 2021-05-30 NOTE — Telephone Encounter (Signed)
   [place] Pre-operative Risk Assessment    Patient Name: Maurice Hall  DOB: 02-Jan-1931 MRN: [id number]  HEARTCARE STAFF:  - IMPORTANT!!!!!! Under Visit Info/Reason for Call, type in Other and utilize the format Clearance MM/DD/YY or Clearance TBD. Do not use dashes or single digits. - Please review there is not already an duplicate clearance open for this procedure. - If request is for dental extraction, please clarify the # of teeth to be extracted. - If the patient is currently at the dentist's office, call Pre-Op Callback Staff (MA/nurse) to input urgent request.  - If the patient is not currently in the dentist office, please route to the Pre-Op pool.  Request for surgical clearance:  What type of surgery is being performed? LESI L5-S1  When is this surgery scheduled? 06/09/21  What type of clearance is required (medical clearance vs. Pharmacy clearance to hold med vs. Both)? Both  Are there any medications that need to be held prior to surgery and how long? Xarelto 3 days prior  Practice name and name of physician performing surgery? [facility], Dr Davy Pique  What is the office phone number? [phone]   7.   What is the office fax number? [phone]  8.   Anesthesia type (None, local, MAC, general) ? Not listed   Juventino Slovak 05/30/2021, 5:00 PM  _________________________________________________________________   (provider comments below)

## 2021-05-31 NOTE — Telephone Encounter (Signed)
Patient with diagnosis of atrial fibrillation on Xarelto for anticoagulation.    Procedure: LESI L5-S1 Date of procedure: 06/09/21   CHA2DS2-VASc Score = 4   This indicates a 4.8% annual risk of stroke. The patient's score is based upon: CHF History: 0 HTN History: 1 Diabetes History: 0 Stroke History: 0 Vascular Disease History: 1 Age Score: 2 Gender Score: 0   CrCl 66 (using adjusted body weight) Platelet count 189  Per office protocol, patient can hold Xarelto for 3 days prior to procedure.   Patient will not need bridging with Lovenox (enoxaparin) around procedure.

## 2021-05-31 NOTE — Telephone Encounter (Signed)
   Name: Maurice Hall  DOB: Nov 09, 1930  MRN: [id number]   Primary Cardiologist:Dr. Ladona Ridgel  Chart reviewed as part of pre-operative protocol coverage. Patient was contacted 05/31/2021 in reference to pre-operative risk assessment for pending surgery as outlined below.  Maurice Hall was last seen on 11/2020. Wife assists with conversation as patient is hard of hearing and requested her assistance. They report he is overall stable since last OV. He has since been evaluated by pulmonology for his SOB and they report he was diagnosed with emphysema. No new cardiac symptoms or cardioversion procedures. No recent cardiac admissions elsewhere. Therefore, based on ACC/AHA guidelines, the patient would be at acceptable risk for the planned procedure without further cardiovascular testing. The patient was advised that if he develops new symptoms prior to surgery to contact our office to arrange for a follow-up visit, and he verbalized understanding.  PharmD team reviewed anticoagulation and per their recommendations, "Per office protocol, patient can hold Xarelto for 3 days prior to procedure.   Patient will not need bridging with Lovenox (enoxaparin) around procedure."  I will route this recommendation to the requesting party via Epic fax function and remove from pre-op pool. Please call with questions.  Laurann Montana, PA-C 05/31/2021, 10:31 AM

## 2021-06-07 DIAGNOSIS — M159 Polyosteoarthritis, unspecified: Secondary | ICD-10-CM | POA: Diagnosis not present

## 2021-06-07 DIAGNOSIS — E785 Hyperlipidemia, unspecified: Secondary | ICD-10-CM | POA: Diagnosis not present

## 2021-06-07 DIAGNOSIS — I10 Essential (primary) hypertension: Secondary | ICD-10-CM | POA: Diagnosis not present

## 2021-06-09 DIAGNOSIS — M5416 Radiculopathy, lumbar region: Secondary | ICD-10-CM | POA: Diagnosis not present

## 2021-07-01 ENCOUNTER — Other Ambulatory Visit: Payer: Self-pay | Admitting: Pulmonary Disease

## 2021-07-04 ENCOUNTER — Telehealth: Payer: Self-pay | Admitting: Internal Medicine

## 2021-07-04 NOTE — Telephone Encounter (Signed)
Advised that transmission is due tomorrow.  Advised if monitor has any date other than current date tomorrow, to call tech support.  Phone number provided.

## 2021-07-04 NOTE — Telephone Encounter (Signed)
Pt's device is schedule to send a remote transmission on 07/05/21, pt's wife said it's supposed to be today. Pt moved his device because he needed the outlet to plug up christmas lights and now the color on the machine is orange and below the orange color are the words "Phone [employer]"

## 2021-07-05 ENCOUNTER — Telehealth: Payer: Self-pay

## 2021-07-05 NOTE — Telephone Encounter (Signed)
Patient called in stating she was having issues with monitor and we have called tech support on the line to troubleshoot. The troubleshoot was not successful and they have to send a new monitor to patients home. It will take 7-10 business days and the patient will call once they get it in the mail

## 2021-07-07 DIAGNOSIS — E785 Hyperlipidemia, unspecified: Secondary | ICD-10-CM | POA: Diagnosis not present

## 2021-07-07 DIAGNOSIS — M159 Polyosteoarthritis, unspecified: Secondary | ICD-10-CM | POA: Diagnosis not present

## 2021-07-07 DIAGNOSIS — I10 Essential (primary) hypertension: Secondary | ICD-10-CM | POA: Diagnosis not present

## 2021-07-11 ENCOUNTER — Ambulatory Visit: Payer: Medicare PPO | Admitting: Internal Medicine

## 2021-07-11 ENCOUNTER — Other Ambulatory Visit: Payer: Self-pay

## 2021-07-11 ENCOUNTER — Encounter: Payer: Self-pay | Admitting: Internal Medicine

## 2021-07-11 ENCOUNTER — Encounter: Payer: Self-pay | Admitting: *Deleted

## 2021-07-11 DIAGNOSIS — M5136 Other intervertebral disc degeneration, lumbar region: Secondary | ICD-10-CM | POA: Diagnosis not present

## 2021-07-11 DIAGNOSIS — I495 Sick sinus syndrome: Secondary | ICD-10-CM

## 2021-07-11 DIAGNOSIS — M47816 Spondylosis without myelopathy or radiculopathy, lumbar region: Secondary | ICD-10-CM | POA: Diagnosis not present

## 2021-07-11 NOTE — Patient Instructions (Signed)
Medication Instructions:  Your physician recommends that you continue on your current medications as directed. Please refer to the Current Medication list given to you today.  *If you need a refill on your cardiac medications before your next appointment, please call your pharmacy*   Lab Work: None ordered   Testing/Procedures: None ordered   Follow-Up: At [facility], you and your health needs are our priority.  As part of our continuing mission to provide you with exceptional heart care, we have created designated Provider Care Teams.  These Care Teams include your primary Cardiologist (physician) and Advanced Practice Providers (APPs -  Physician Assistants and Nurse Practitioners) who all work together to provide you with the care you need, when you need it.  Remote monitoring is used to monitor your Pacemaker or ICD from home. This monitoring reduces the number of office visits required to check your device to one time per year. It allows [place] to keep an eye on the functioning of your device to ensure it is working properly. You are scheduled for a device check from home on 08/03/21. You may send your transmission at any time that day. If you have a wireless device, the transmission will be sent automatically. After your physician reviews your transmission, you will receive a postcard with your next transmission date.  Your next appointment:   6 month(s)  The format for your next appointment:   In Person  Provider:   Lewayne Bunting, MD   Thank you for choosing [facility]!!    [phone]

## 2021-07-11 NOTE — Progress Notes (Signed)
HPI [name] returns today for ongoing evaluation. He is a pleasant 87 yo man with dyspnea and HTN and sinus node dysfunction s/p PPM insertion. He has had a heart cath which did not show obstructive CAD and a CPX test which demonstrated some deconditioning but normal for age. In the interim, he has developed atrial fib for which he is asymptomatic. He has been bothered by low back pain and is pending spinal injection. He denies angina. He has become more sedentary.  No Known Allergies   Current Outpatient Medications  Medication Sig Dispense Refill   acetaminophen (TYLENOL) 500 MG tablet Take 1,500 mg by mouth daily.      donepezil (ARICEPT) 10 MG tablet Take 10 mg by mouth daily.      dorzolamide-timolol (COSOPT) 22.3-6.8 MG/ML ophthalmic solution Place 1 drop into the right eye 2 (two) times daily.     Fluticasone-Umeclidin-Vilant (TRELEGY ELLIPTA) 100-62.5-25 MCG/ACT AEPB INHALE 1 PUFF INTO THE LUNGS ONCE DAILY 60 each 5   folic acid (FOLVITE) 1 MG tablet Take 1 mg by mouth daily.      furosemide (LASIX) 40 MG tablet Take 1 tablet by mouth once daily 90 tablet 3   ipratropium (ATROVENT) 0.06 % nasal spray Place 2 sprays into both nostrils 4 (four) times daily. 15 mL 6   losartan (COZAAR) 100 MG tablet Take 100 mg by mouth daily as needed (High blood pressure).      Melatonin 10 MG TABS Take 10 mg by mouth at bedtime.      methotrexate 50 MG/2ML injection Inject 20 mg into the skin every Thursday. 0.8 ml in stomach     Misc Natural Products (OSTEO BI-FLEX ADV JOINT SHIELD PO) Take 3 tablets by mouth daily.     Omega-3 Fatty Acids (FISH OIL) 1000 MG CAPS Take 3,000 mg by mouth daily.      promethazine-codeine (PHENERGAN WITH CODEINE) 6.25-10 MG/5ML syrup Take 5 mLs by mouth every 6 (six) hours as needed for cough. 120 mL 0   rivaroxaban (XARELTO) 20 MG TABS tablet Take 1 tablet by mouth once daily 90 tablet 1   simvastatin (ZOCOR) 20 MG tablet Take 20 mg by mouth at bedtime.      tamsulosin (FLOMAX) 0.4 MG CAPS capsule Take 0.4 mg by mouth daily.      trazodone (DESYREL) 300 MG tablet Take 150 mg by mouth at bedtime.      No current facility-administered medications for this visit.     Past Medical History:  Diagnosis Date   Arthritis    Dyslipidemia    GERD (gastroesophageal reflux disease)    HTN (hypertension)    Paroxysmal atrial fibrillation (HCC)    Symptomatic bradycardia    a. s/p MDT dual chamber PPM    ROS:   All systems reviewed and negative except as noted in the HPI.   Past Surgical History:  Procedure Laterality Date   APPENDECTOMY  1946   EP IMPLANTABLE DEVICE N/A 05/21/2015   Procedure:  PPM Generator Changeout;  Surgeon: Marinus Maw, MD;  Location: [facility] INVASIVE CV LAB;  Service: Cardiovascular;  Laterality: N/A;   EYE SURGERY  02/01/2007,09/02/2007   left eye, right eye   right knee cartilage removed  1975,1999   right shoulder  08/26/1990   arthroscopy   RIGHT/LEFT HEART CATH AND CORONARY ANGIOGRAPHY N/A 12/08/2019   Procedure: RIGHT/LEFT HEART CATH AND CORONARY ANGIOGRAPHY;  Surgeon: Yvonne Kendall, MD;  Location: [facility];  Service: Cardiovascular;  Laterality: N/A;   S/P PPM (Medtronic Sigma DDD  12/03   STERIOD INJECTION  12/11/2011   Procedure: STEROID INJECTION;  Surgeon: Gearlean Alf, MD;  Location: [facility];  Service: Orthopedics;  Laterality: Left;   TONSILLECTOMY     age 13   TOTAL KNEE ARTHROPLASTY  12/11/2011   Procedure: TOTAL KNEE ARTHROPLASTY;  Surgeon: Gearlean Alf, MD;  Location: [facility];  Service: Orthopedics;  Laterality: Right;   VASECTOMY  1968   vocal cord growth   02/1999   removed growth on vocal cords     Family History  Problem Relation Age of Onset   Coronary artery disease Neg Hx      Social History   Socioeconomic History   Marital status: Married    Spouse name: Not on file   Number of children: Not on file   Years of education: Not on file   Highest education level: Not on  file  Occupational History   Not on file  Tobacco Use   Smoking status: Former    Packs/day: 3.00    Years: 15.00    Pack years: 45.00    Types: Cigarettes    Start date: 89    Quit date: 12/04/1963    Years since quitting: 56.6   Smokeless tobacco: Never  Vaping Use   Vaping Use: Never used  Substance and Sexual Activity   Alcohol use: No   Drug use: No    Comment: Rare ETOH    Sexual activity: Not on file  Other Topics Concern   Not on file  Social History Narrative   Not on file   Social Determinants of Health   Financial Resource Strain: Not on file  Food Insecurity: Not on file  Transportation Needs: Not on file  Physical Activity: Not on file  Stress: Not on file  Social Connections: Not on file  Intimate Partner Violence: Not on file     BP 140/72   Pulse 76   Ht 6\' 2"  (1.88 m)   Wt 222 lb (100.7 kg)   SpO2 98%   BMI 28.50 kg/m   Physical Exam:  Well appearing NAD HEENT: Unremarkable Neck:  No JVD, no thyromegally Lymphatics:  No adenopathy Back:  No CVA tenderness Lungs:  Clear with no wheezes HEART:  IRegular rate rhythm, no murmurs, no rubs, no clicks Abd:  soft, positive bowel sounds, no organomegally, no rebound, no guarding Ext:  2 plus pulses, no edema, no cyanosis, no clubbing Skin:  No rashes no nodules Neuro:  CN II through XII intact, motor grossly intact  DEVICE  Normal device function.  See PaceArt for details.   Assess/Plan:  1. Persistent atrial fib - he is asymptomatic. He will continue xarelto though it is on hold for a spinal injection later today. We discussed DCCV and I have not recommended due to his rate being controlled and lack of symptoms.  2. Chronic diastolic heart failure - he will continue lasix. I encouraged him to avoid salty foods. 3. Sinus node dysfunction - he is asymptomatic s/p PPM. Underlying HR is in the low 40's/high 30's.  4. PPM - his medtronic DDD PM is working normally. We will recheck in several  months   Carleene Overlie Ayva Veilleux,MD

## 2021-08-03 DIAGNOSIS — H353121 Nonexudative age-related macular degeneration, left eye, early dry stage: Secondary | ICD-10-CM | POA: Diagnosis not present

## 2021-08-07 DIAGNOSIS — E785 Hyperlipidemia, unspecified: Secondary | ICD-10-CM | POA: Diagnosis not present

## 2021-08-07 DIAGNOSIS — M159 Polyosteoarthritis, unspecified: Secondary | ICD-10-CM | POA: Diagnosis not present

## 2021-08-07 DIAGNOSIS — I10 Essential (primary) hypertension: Secondary | ICD-10-CM | POA: Diagnosis not present

## 2021-08-21 ENCOUNTER — Other Ambulatory Visit: Payer: Self-pay | Admitting: Internal Medicine

## 2021-08-30 ENCOUNTER — Other Ambulatory Visit: Payer: Self-pay

## 2021-08-30 MED ORDER — FUROSEMIDE 40 MG PO TABS: 40.0000 mg | ORAL_TABLET | Freq: Every day | ORAL | 3 refills | Status: DC

## 2021-08-31 MED ORDER — RIVAROXABAN 20 MG PO TABS: 20.0000 mg | ORAL_TABLET | Freq: Every day | ORAL | 1 refills | Status: DC

## 2021-08-31 NOTE — Telephone Encounter (Signed)
Pt last saw Dr Ladona Ridgel 07/11/21, last labs 05/18/21 Creat 1.01 at [facility] per [facility], age 88, weight 100.7kg, CrCl 72.01, based on CrCl pt is on appropriate dosage of Xarelto 20mg  QD for afib.  Will refill rx.

## 2021-09-06 ENCOUNTER — Other Ambulatory Visit: Payer: Self-pay | Admitting: *Deleted

## 2021-09-06 MED ORDER — TRELEGY ELLIPTA 100-62.5-25 MCG/ACT IN AEPB: INHALATION_SPRAY | RESPIRATORY_TRACT | 3 refills | Status: DC

## 2021-09-07 DIAGNOSIS — M159 Polyosteoarthritis, unspecified: Secondary | ICD-10-CM | POA: Diagnosis not present

## 2021-09-07 DIAGNOSIS — I10 Essential (primary) hypertension: Secondary | ICD-10-CM | POA: Diagnosis not present

## 2021-09-07 DIAGNOSIS — E785 Hyperlipidemia, unspecified: Secondary | ICD-10-CM | POA: Diagnosis not present

## 2021-09-12 DIAGNOSIS — M5416 Radiculopathy, lumbar region: Secondary | ICD-10-CM | POA: Diagnosis not present

## 2021-10-05 DIAGNOSIS — I10 Essential (primary) hypertension: Secondary | ICD-10-CM | POA: Diagnosis not present

## 2021-10-05 DIAGNOSIS — E785 Hyperlipidemia, unspecified: Secondary | ICD-10-CM | POA: Diagnosis not present

## 2021-10-05 DIAGNOSIS — M159 Polyosteoarthritis, unspecified: Secondary | ICD-10-CM | POA: Diagnosis not present

## 2021-10-12 ENCOUNTER — Telehealth: Payer: Self-pay | Admitting: *Deleted

## 2021-10-12 DIAGNOSIS — I5032 Chronic diastolic (congestive) heart failure: Secondary | ICD-10-CM

## 2021-10-12 DIAGNOSIS — M47816 Spondylosis without myelopathy or radiculopathy, lumbar region: Secondary | ICD-10-CM | POA: Diagnosis not present

## 2021-10-12 DIAGNOSIS — I700 Atherosclerosis of aorta: Secondary | ICD-10-CM

## 2021-10-12 NOTE — Telephone Encounter (Signed)
? ?  Pre-operative Risk Assessment  ?  ?Patient Name: Maurice Hall  ?DOB: 03/09/1931 ?MRN: [id number]  ? ?  ? ?Request for Surgical Clearance   ? ?Procedure:   LUMBAR EPIDURAL INJECTION ? ?Date of Surgery:  Clearance TBD                              ?   ?Surgeon:  DR. DAVE EICHMAN ?Surgeon's Group or Practice Name:  [facility] ?Phone number:  [phone] ?Fax number:  [phone] ?  ?Type of Clearance Requested:   ?- Pharmacy:  Hold Rivaroxaban (Xarelto) X'S 3 DAYS ?  ?Type of Anesthesia:  Not Indicated ?  ?Additional requests/questions:   ? ?Signed, ?Elliot Cousin   ?10/12/2021, 4:44 PM  ? ?

## 2021-10-13 DIAGNOSIS — I700 Atherosclerosis of aorta: Secondary | ICD-10-CM | POA: Insufficient documentation

## 2021-10-13 DIAGNOSIS — I5032 Chronic diastolic (congestive) heart failure: Secondary | ICD-10-CM | POA: Insufficient documentation

## 2021-10-13 NOTE — Telephone Encounter (Signed)
Patient with diagnosis of afib on Xarelto for anticoagulation.   ? ?Procedure: lumbar ESI ?Date of procedure: TBD ? ?CHA2DS2-VASc Score = 7  ?This indicates a 11.2% annual risk of stroke. ?The patient's score is based upon: ?CHF History: 1 ?HTN History: 1 ?Diabetes History: 0 ?Stroke History: 2 ?Vascular Disease History: 1 ?Age Score: 2 ?Gender Score: 0 ? ?Diastolic CHF and CAD (extensive atherosclerosis noted on chest CT) were not listed on problem list, these have been added. ?Also has CVA listed under external problems that weren't reconciled, this was added 07/18/19 by [facility] but cannot find any further details and it's not mentioned in any cardiology notes. ? ?CrCl 75mL/min using adjusted body weight. ?Platelet count 189K ? ?Pt was previously cleared for same procedure a few months ago however a few risk factors were missing from that clearance. Pt would need to hold Xarelto for 3 days prior to spinal procedure. With hx of stroke now included in PMH, will forward to MD to confirm that 3 day hold is still acceptable. ?

## 2021-10-20 NOTE — Telephone Encounter (Signed)
Ok to hold Xarelto for 2-3 days prior to procedure and restart when [facility] with MD performing procedure. GT ?

## 2021-10-21 ENCOUNTER — Telehealth: Payer: Self-pay

## 2021-10-21 NOTE — Telephone Encounter (Signed)
The patient tried to send a transmission but we did not receive it. I gave him the number to [place] support to get additional help. ?

## 2021-10-24 NOTE — Telephone Encounter (Signed)
? ?  Name: Maurice Hall  ?DOB: 1931/05/04  ?MRN: [id number]  ? ?Primary Cardiologist: Lewayne Bunting, MD ? ?Chart reviewed as part of pre-operative protocol coverage. We are asked for guidance to hold xarelto.  ? ?Per our clinical pharmacist: ?Patient with diagnosis of afib on Xarelto for anticoagulation.   ?  ?Procedure: lumbar ESI ?Date of procedure: TBD ?  ?CHA2DS2-VASc Score = 7  ?This indicates a 11.2% annual risk of stroke. ?The patient's score is based upon: ?CHF History: 1 ?HTN History: 1 ?Diabetes History: 0 ?Stroke History: 2 ?Vascular Disease History: 1 ?Age Score: 2 ?Gender Score: 0 ?  ?Diastolic CHF and CAD (extensive atherosclerosis noted on chest CT) were not listed on problem list, these have been added. ?Also has CVA listed under external problems that weren't reconciled, this was added 07/18/19 by [facility] but cannot find any further details and it's not mentioned in any cardiology notes. ?  ?CrCl 17mL/min using adjusted body weight. ?Platelet count 189K ?  ?Pt was previously cleared for same procedure a few months ago however a few risk factors were missing from that clearance. Pt would need to hold Xarelto for 3 days prior to spinal procedure. With hx of stroke now included in PMH, will forward to MD to confirm that 3 day hold is still acceptable. ? ?Per Dr. Ladona Ridgel: ?Ok to hold Xarelto for 2-3 days prior to procedure and restart when [facility] with MD performing procedure ? ? ?I will route this recommendation to the requesting party via Epic fax function and remove from pre-op pool. Please call with questions. ? ?Marcelino Duster, PA ?10/24/2021, 11:41 AM ? ?

## 2021-11-05 DIAGNOSIS — J439 Emphysema, unspecified: Secondary | ICD-10-CM | POA: Diagnosis not present

## 2021-11-05 DIAGNOSIS — E782 Mixed hyperlipidemia: Secondary | ICD-10-CM | POA: Diagnosis not present

## 2021-11-15 DIAGNOSIS — R7303 Prediabetes: Secondary | ICD-10-CM | POA: Diagnosis not present

## 2021-11-15 DIAGNOSIS — E785 Hyperlipidemia, unspecified: Secondary | ICD-10-CM | POA: Diagnosis not present

## 2021-11-15 DIAGNOSIS — I5030 Unspecified diastolic (congestive) heart failure: Secondary | ICD-10-CM | POA: Diagnosis not present

## 2021-11-15 DIAGNOSIS — I700 Atherosclerosis of aorta: Secondary | ICD-10-CM | POA: Diagnosis not present

## 2021-11-16 DIAGNOSIS — I5030 Unspecified diastolic (congestive) heart failure: Secondary | ICD-10-CM | POA: Diagnosis not present

## 2021-11-16 DIAGNOSIS — E785 Hyperlipidemia, unspecified: Secondary | ICD-10-CM | POA: Diagnosis not present

## 2021-11-16 DIAGNOSIS — R7303 Prediabetes: Secondary | ICD-10-CM | POA: Diagnosis not present

## 2021-11-18 DIAGNOSIS — J209 Acute bronchitis, unspecified: Secondary | ICD-10-CM | POA: Diagnosis not present

## 2021-11-18 DIAGNOSIS — Z20828 Contact with and (suspected) exposure to other viral communicable diseases: Secondary | ICD-10-CM | POA: Diagnosis not present

## 2021-11-21 DIAGNOSIS — Z139 Encounter for screening, unspecified: Secondary | ICD-10-CM | POA: Diagnosis not present

## 2021-11-21 DIAGNOSIS — I482 Chronic atrial fibrillation, unspecified: Secondary | ICD-10-CM | POA: Diagnosis not present

## 2021-11-21 DIAGNOSIS — Z1331 Encounter for screening for depression: Secondary | ICD-10-CM | POA: Diagnosis not present

## 2021-11-21 DIAGNOSIS — Z0000 Encounter for general adult medical examination without abnormal findings: Secondary | ICD-10-CM | POA: Diagnosis not present

## 2021-11-21 DIAGNOSIS — Z1339 Encounter for screening examination for other mental health and behavioral disorders: Secondary | ICD-10-CM | POA: Diagnosis not present

## 2021-11-21 DIAGNOSIS — I5030 Unspecified diastolic (congestive) heart failure: Secondary | ICD-10-CM | POA: Diagnosis not present

## 2021-11-21 DIAGNOSIS — Z1389 Encounter for screening for other disorder: Secondary | ICD-10-CM | POA: Diagnosis not present

## 2021-11-21 DIAGNOSIS — E785 Hyperlipidemia, unspecified: Secondary | ICD-10-CM | POA: Diagnosis not present

## 2021-11-21 DIAGNOSIS — R7303 Prediabetes: Secondary | ICD-10-CM | POA: Diagnosis not present

## 2021-11-22 DIAGNOSIS — G603 Idiopathic progressive neuropathy: Secondary | ICD-10-CM | POA: Diagnosis not present

## 2021-11-22 DIAGNOSIS — Z79899 Other long term (current) drug therapy: Secondary | ICD-10-CM | POA: Diagnosis not present

## 2021-11-22 DIAGNOSIS — L4050 Arthropathic psoriasis, unspecified: Secondary | ICD-10-CM | POA: Diagnosis not present

## 2021-11-22 DIAGNOSIS — M1991 Primary osteoarthritis, unspecified site: Secondary | ICD-10-CM | POA: Diagnosis not present

## 2021-11-22 DIAGNOSIS — R5382 Chronic fatigue, unspecified: Secondary | ICD-10-CM | POA: Diagnosis not present

## 2021-11-22 DIAGNOSIS — E663 Overweight: Secondary | ICD-10-CM | POA: Diagnosis not present

## 2021-11-22 DIAGNOSIS — R0602 Shortness of breath: Secondary | ICD-10-CM | POA: Diagnosis not present

## 2021-11-22 DIAGNOSIS — Z6828 Body mass index (BMI) 28.0-28.9, adult: Secondary | ICD-10-CM | POA: Diagnosis not present

## 2021-11-23 DIAGNOSIS — R0602 Shortness of breath: Secondary | ICD-10-CM | POA: Diagnosis not present

## 2021-11-28 DIAGNOSIS — I5030 Unspecified diastolic (congestive) heart failure: Secondary | ICD-10-CM | POA: Diagnosis not present

## 2021-11-28 DIAGNOSIS — Z6830 Body mass index (BMI) 30.0-30.9, adult: Secondary | ICD-10-CM | POA: Diagnosis not present

## 2021-12-05 DIAGNOSIS — E782 Mixed hyperlipidemia: Secondary | ICD-10-CM | POA: Diagnosis not present

## 2021-12-05 DIAGNOSIS — J439 Emphysema, unspecified: Secondary | ICD-10-CM | POA: Diagnosis not present

## 2021-12-16 DIAGNOSIS — J90 Pleural effusion, not elsewhere classified: Secondary | ICD-10-CM | POA: Diagnosis not present

## 2021-12-16 DIAGNOSIS — Z6829 Body mass index (BMI) 29.0-29.9, adult: Secondary | ICD-10-CM | POA: Diagnosis not present

## 2021-12-22 DIAGNOSIS — J9811 Atelectasis: Secondary | ICD-10-CM | POA: Diagnosis not present

## 2021-12-22 DIAGNOSIS — I700 Atherosclerosis of aorta: Secondary | ICD-10-CM | POA: Diagnosis not present

## 2021-12-22 DIAGNOSIS — J90 Pleural effusion, not elsewhere classified: Secondary | ICD-10-CM | POA: Diagnosis not present

## 2021-12-22 DIAGNOSIS — J439 Emphysema, unspecified: Secondary | ICD-10-CM | POA: Diagnosis not present

## 2021-12-22 DIAGNOSIS — E041 Nontoxic single thyroid nodule: Secondary | ICD-10-CM | POA: Diagnosis not present

## 2022-01-05 DIAGNOSIS — E782 Mixed hyperlipidemia: Secondary | ICD-10-CM | POA: Diagnosis not present

## 2022-01-05 DIAGNOSIS — J439 Emphysema, unspecified: Secondary | ICD-10-CM | POA: Diagnosis not present

## 2022-01-16 ENCOUNTER — Encounter: Payer: Self-pay | Admitting: Internal Medicine

## 2022-01-16 ENCOUNTER — Ambulatory Visit: Payer: Medicare PPO | Admitting: Internal Medicine

## 2022-01-16 VITALS — BP 110/72 | HR 81 | Ht 74.0 in | Wt 218.4 lb

## 2022-01-16 DIAGNOSIS — Z950 Presence of cardiac pacemaker: Secondary | ICD-10-CM | POA: Diagnosis not present

## 2022-01-16 DIAGNOSIS — I495 Sick sinus syndrome: Secondary | ICD-10-CM | POA: Diagnosis not present

## 2022-01-16 DIAGNOSIS — I5032 Chronic diastolic (congestive) heart failure: Secondary | ICD-10-CM

## 2022-01-16 DIAGNOSIS — I480 Paroxysmal atrial fibrillation: Secondary | ICD-10-CM | POA: Diagnosis not present

## 2022-01-16 NOTE — Progress Notes (Signed)
HPI Mr. Chad returns today for ongoing evaluation. He is a pleasant 88 yo man with dyspnea and HTN and sinus node dysfunction s/p PPM insertion. He has had a heart cath which did not show obstructive CAD and a CPX test which demonstrated some deconditioning but normal for age. He was in atrial fib for which he is asymptomatic at his last visit. He has been bothered by low back pain and has undergone spinal injection. He denies angina. He has become more sedentary. He is pending another injection.  No Known Allergies   Current Outpatient Medications  Medication Sig Dispense Refill   acetaminophen (TYLENOL) 500 MG tablet Take 1,500 mg by mouth daily.      donepezil (ARICEPT) 10 MG tablet Take 10 mg by mouth daily.      dorzolamide-timolol (COSOPT) 22.3-6.8 MG/ML ophthalmic solution Place 1 drop into the right eye 2 (two) times daily.     Fluticasone-Umeclidin-Vilant (TRELEGY ELLIPTA) 100-62.5-25 MCG/ACT AEPB INHALE 1 PUFF INTO THE LUNGS ONCE DAILY 99991111 each 3   folic acid (FOLVITE) 1 MG tablet Take 1 mg by mouth daily.      furosemide (LASIX) 40 MG tablet Take 1 tablet (40 mg total) by mouth daily. 90 tablet 3   ipratropium (ATROVENT) 0.06 % nasal spray Place 2 sprays into both nostrils 4 (four) times daily. 15 mL 6   losartan (COZAAR) 100 MG tablet Take 100 mg by mouth daily as needed (High blood pressure).      Melatonin 10 MG TABS Take 10 mg by mouth at bedtime.      methotrexate 50 MG/2ML injection Inject 20 mg into the skin every Thursday. 0.8 ml in stomach     Misc Natural Products (OSTEO BI-FLEX ADV JOINT SHIELD PO) Take 3 tablets by mouth daily.     Omega-3 Fatty Acids (FISH OIL) 1000 MG CAPS Take 3,000 mg by mouth daily.      rivaroxaban (XARELTO) 20 MG TABS tablet Take 1 tablet (20 mg total) by mouth daily. 90 tablet 1   simvastatin (ZOCOR) 20 MG tablet Take 20 mg by mouth at bedtime.     tamsulosin (FLOMAX) 0.4 MG CAPS capsule Take 0.4 mg by mouth daily.      trazodone  (DESYREL) 300 MG tablet Take 150 mg by mouth at bedtime.      promethazine-codeine (PHENERGAN WITH CODEINE) 6.25-10 MG/5ML syrup Take 5 mLs by mouth every 6 (six) hours as needed for cough. (Patient not taking: Reported on 01/16/2022) 120 mL 0   No current facility-administered medications for this visit.     Past Medical History:  Diagnosis Date   Arthritis    Dyslipidemia    GERD (gastroesophageal reflux disease)    HTN (hypertension)    Paroxysmal atrial fibrillation (HCC)    Symptomatic bradycardia    a. s/p MDT dual chamber PPM    ROS:   All systems reviewed and negative except as noted in the HPI.   Past Surgical History:  Procedure Laterality Date   APPENDECTOMY  1946   EP IMPLANTABLE DEVICE N/A 05/21/2015   Procedure:  PPM Generator Changeout;  Surgeon: Evans Lance, MD;  Location: [facility];  Service: Cardiovascular;  Laterality: N/A;   EYE SURGERY  02/01/2007,09/02/2007   left eye, right eye   right knee cartilage removed  1975,1999   right shoulder  08/26/1990   arthroscopy   RIGHT/LEFT HEART CATH AND CORONARY ANGIOGRAPHY N/A 12/08/2019   Procedure: RIGHT/LEFT HEART  CATH AND CORONARY ANGIOGRAPHY;  Surgeon: Nelva Bush, MD;  Location: [facility];  Service: Cardiovascular;  Laterality: N/A;   S/P PPM ([employer] Sigma DDD  12/03   STERIOD INJECTION  12/11/2011   Procedure: STEROID INJECTION;  Surgeon: Gearlean Alf, MD;  Location: [facility];  Service: Orthopedics;  Laterality: Left;   TONSILLECTOMY     age 62   TOTAL KNEE ARTHROPLASTY  12/11/2011   Procedure: TOTAL KNEE ARTHROPLASTY;  Surgeon: Gearlean Alf, MD;  Location: [facility];  Service: Orthopedics;  Laterality: Right;   VASECTOMY  1968   vocal cord growth   02/1999   removed growth on vocal cords     Family History  Problem Relation Age of Onset   Coronary artery disease Neg Hx      Social History   Socioeconomic History   Marital status: Married    Spouse name: Not on file    Number of children: Not on file   Years of education: Not on file   Highest education level: Not on file  Occupational History   Not on file  Tobacco Use   Smoking status: Former    Packs/day: 3.00    Years: 15.00    Total pack years: 45.00    Types: Cigarettes    Start date: 54    Quit date: 12/04/1963    Years since quitting: 58.1   Smokeless tobacco: Never  Vaping Use   Vaping Use: Never used  Substance and Sexual Activity   Alcohol use: No   Drug use: No    Comment: Rare ETOH    Sexual activity: Not on file  Other Topics Concern   Not on file  Social History Narrative   Not on file   Social Determinants of Health   Financial Resource Strain: Not on file  Food Insecurity: Not on file  Transportation Needs: Not on file  Physical Activity: Not on file  Stress: Not on file  Social Connections: Not on file  Intimate Partner Violence: Not on file     BP 110/72   Pulse 81   Ht 6\' 2"  (1.88 m)   Wt 218 lb 6.4 oz (99.1 kg)   SpO2 93%   BMI 28.04 kg/m   Physical Exam:  Well appearing NAD HEENT: Unremarkable Neck:  No JVD, no thyromegally Lymphatics:  No adenopathy Back:  No CVA tenderness Lungs:  Clear with no wheezes HEART:  IRegular rate rhythm, no murmurs, no rubs, no clicks Abd:  soft, positive bowel sounds, no organomegally, no rebound, no guarding Ext:  2 plus pulses, no edema, no cyanosis, no clubbing Skin:  No rashes no nodules Neuro:  CN II through XII intact, motor grossly intact  EKG - atrial fib with a controlled VR  DEVICE  Normal device function.  See PaceArt for details.   Assess/Plan:  Atrial fib - he is mostly in atrial fib but is asymptomatic. He will continue his current meds. Coags - he will continue his xarelto. It can be held for spinal injections as needed. Diastolic heart failure - his symptoms are class 2. Continue current meds. HTN - his bp is controlled. No change.  Carleene Overlie Kupono Marling,MD

## 2022-01-16 NOTE — Patient Instructions (Addendum)
Medication Instructions:  Your physician recommends that you continue on your current medications as directed. Please refer to the Current Medication list given to you today.  Labwork: None ordered.  Testing/Procedures: None ordered.  Follow-Up: Your physician wants you to follow-up in: 6 months with Gregg Taylor, MD or one of the following Advanced Practice Providers on your designated Care Team:   Renee Ursuy, PA-C Michael "Andy" Tillery, PA-C    Any Other Special Instructions Will Be Listed Below (If Applicable).  If you need a refill on your cardiac medications before your next appointment, please call your pharmacy.   Important Information About Sugar        

## 2022-01-17 DIAGNOSIS — M5416 Radiculopathy, lumbar region: Secondary | ICD-10-CM | POA: Diagnosis not present

## 2022-02-01 DIAGNOSIS — H3411 Central retinal artery occlusion, right eye: Secondary | ICD-10-CM | POA: Diagnosis not present

## 2022-02-04 DIAGNOSIS — I700 Atherosclerosis of aorta: Secondary | ICD-10-CM | POA: Diagnosis not present

## 2022-02-04 DIAGNOSIS — E782 Mixed hyperlipidemia: Secondary | ICD-10-CM | POA: Diagnosis not present

## 2022-02-06 ENCOUNTER — Telehealth: Payer: Self-pay

## 2022-02-06 NOTE — Telephone Encounter (Signed)
The patient wife was still using the old monitor and not the new monitor. [employer] ordered her a new monitor for the patient again. She should receive it in 7-10 business days.

## 2022-02-10 DIAGNOSIS — I5030 Unspecified diastolic (congestive) heart failure: Secondary | ICD-10-CM | POA: Diagnosis not present

## 2022-02-10 DIAGNOSIS — R0602 Shortness of breath: Secondary | ICD-10-CM | POA: Diagnosis not present

## 2022-02-10 DIAGNOSIS — Z6829 Body mass index (BMI) 29.0-29.9, adult: Secondary | ICD-10-CM | POA: Diagnosis not present

## 2022-02-11 ENCOUNTER — Other Ambulatory Visit: Payer: Self-pay | Admitting: Internal Medicine

## 2022-02-11 DIAGNOSIS — I480 Paroxysmal atrial fibrillation: Secondary | ICD-10-CM

## 2022-02-13 NOTE — Telephone Encounter (Signed)
Prescription refill request for Xarelto received.  Indication: a fib Last office visit: 01/16/22 Weight: 218 Age: 88  Scr: 1.09 CrCl:  66 mL/min

## 2022-02-15 DIAGNOSIS — R5381 Other malaise: Secondary | ICD-10-CM | POA: Diagnosis not present

## 2022-02-15 DIAGNOSIS — R5383 Other fatigue: Secondary | ICD-10-CM | POA: Diagnosis not present

## 2022-02-15 DIAGNOSIS — Z6829 Body mass index (BMI) 29.0-29.9, adult: Secondary | ICD-10-CM | POA: Diagnosis not present

## 2022-02-15 DIAGNOSIS — G4700 Insomnia, unspecified: Secondary | ICD-10-CM | POA: Diagnosis not present

## 2022-02-15 DIAGNOSIS — R519 Headache, unspecified: Secondary | ICD-10-CM | POA: Diagnosis not present

## 2022-02-20 DIAGNOSIS — R519 Headache, unspecified: Secondary | ICD-10-CM | POA: Diagnosis not present

## 2022-02-24 ENCOUNTER — Telehealth: Payer: Self-pay

## 2022-02-24 NOTE — Telephone Encounter (Signed)
(  2:42 pm) SW scheduled an initial palliative care visit with patient. RN/SW team to see patien ton 03/01/22 @ 11am.

## 2022-03-01 ENCOUNTER — Other Ambulatory Visit: Payer: Medicare PPO

## 2022-03-01 ENCOUNTER — Encounter: Payer: Self-pay | Admitting: Cardiology

## 2022-03-01 ENCOUNTER — Other Ambulatory Visit: Payer: Medicare PPO | Admitting: *Deleted

## 2022-03-01 VITALS — BP 132/82 | HR 99 | Temp 97.6°F | Resp 20

## 2022-03-01 DIAGNOSIS — I509 Heart failure, unspecified: Secondary | ICD-10-CM | POA: Diagnosis not present

## 2022-03-01 DIAGNOSIS — I480 Paroxysmal atrial fibrillation: Secondary | ICD-10-CM | POA: Diagnosis not present

## 2022-03-01 DIAGNOSIS — I110 Hypertensive heart disease with heart failure: Secondary | ICD-10-CM | POA: Diagnosis not present

## 2022-03-01 DIAGNOSIS — I350 Nonrheumatic aortic (valve) stenosis: Secondary | ICD-10-CM | POA: Diagnosis not present

## 2022-03-01 DIAGNOSIS — J90 Pleural effusion, not elsewhere classified: Secondary | ICD-10-CM | POA: Diagnosis not present

## 2022-03-01 DIAGNOSIS — I4891 Unspecified atrial fibrillation: Secondary | ICD-10-CM | POA: Diagnosis not present

## 2022-03-01 DIAGNOSIS — R609 Edema, unspecified: Secondary | ICD-10-CM | POA: Diagnosis not present

## 2022-03-01 DIAGNOSIS — A419 Sepsis, unspecified organism: Secondary | ICD-10-CM | POA: Diagnosis not present

## 2022-03-01 DIAGNOSIS — I5033 Acute on chronic diastolic (congestive) heart failure: Secondary | ICD-10-CM | POA: Diagnosis not present

## 2022-03-01 DIAGNOSIS — Z515 Encounter for palliative care: Secondary | ICD-10-CM

## 2022-03-01 NOTE — Progress Notes (Signed)
COMMUNITY PALLIATIVE CARE SW NOTE  PATIENT NAME: Maurice Hall DOB: 05/24/1931 MRN: [id number]  PRIMARY CARE PROVIDER: Charlott Rakes, MD  RESPONSIBLE PARTY:  Acct ID - Guarantor Home Phone Work Phone Relationship Acct Type  [id number] [name], [name] [phone]  Self P/F     [address] [place], [place] [address]    SOCIAL WORK ENCOUNTER  SW and RN-[name] completed a face-to-face visit with patient at his home. His wife was present in the home for this visit. Education was provided regarding palliative care services, role in patient's care and visit frequency. Patient has pain and difficulty sleeping. Patient report that he is only getting at about three hours of sleep. He report having "pain all over". Patient observed with swelling to both ankles. Patient have shortness of breath that he relates to his emphysema. Patient appears to have a dry cough. Patient ambulates with a cane in and outside of the home. He reports that he "shuffles" when he walks. Patient report three falls. Patient reported that all his falls occurred in his yard. He sustained a fall and have several skin tears and sores on both arms. Patient report short-term memory deficits and forgetfulness. Patient is completing his own ADL's but is takes him a long time. Patient report that his appetite has declined drastically. Patient is eating at least 2 small meals a day. He states he has is only eating because he knows he has to eat-it's not pleasurable for him. His current weight is 209 lbs. which is down from his regular 250 lbs. He notes his voice is weaker also. He is scheduled to have CT scan scheduled for August 17th. Patient saw his PCP two weeks ago and was prescribed melatonin. Patient expressed his desire is to have at least 8 hours of sleep.   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 Patient is married (40 years) and have six children of blended family. Patient  was in the [employer] for 4 years. Patient's wife serve as her POA/HCPOA. Patient's son-[name] does come over to help him and his wife, along with his wife's two daughters also come over to assist him.   [facility].   DNR: No MOST FORM:No HOSPICE EDUCATION: Yes  Duration of visit and documentation: 60 minutes  Salem Mastrogiovanni, LCSW

## 2022-03-01 NOTE — Progress Notes (Signed)
[facility] COMMUNITY PALLIATIVE CARE RN NOTE  PATIENT NAME: Maurice Hall DOB: Mar 20, 1931 MRN: [id number]  PRIMARY CARE PROVIDER: Charlott Rakes, MD  RESPONSIBLE PARTY: Eston Mould (wife) Acct ID - Guarantor Home Phone Work Phone Relationship Acct Type  [id number] FEDOR, KAZMIERSKI [phone]  Self P/F     [address], [place], [place] [address]   RN/SW team completed initial palliative care consult visit today with patient and his wife in their home.  Cognitive: Very poor short term memory loss. Very forgetful e.g turned water on in kitchen sink, went back 5 hours later and the water was still running. Reports having a CT scan of the brain 2 weeks ago but having heard results. Wife says she received a call from the hospital today stating they are scheduling another CT scan for him.   Pain: Reports generalized pain. Initially told us he did not take anything for pain. Wife confirmed that he takes Tylenol Arthritis twice daily and bottle seen during medication review.  Respiratory: Short of breath with any exertion. Occasional nonproductive cough. Requires frequent rest periods after any activity. Not taking his inhaler. According to pulmonary visit notes from 05/2021 patient had a CT scan that showed emphysema and a lung nodule at the base of his lung. Recommended he have a CT of chest in about a year.  Mobility: Ambulates using a cane at a very slow pace. Has had 3 falls in the past 2 months, one resulting in him falling in a ditch in his yard. He had to roll around on gravel to get himself out of the ditch. Worsening balance issues. Still on Xarelto. Able to bathe and dress himself but has become a very difficult task and taking longer to perform. Still drives on occasion.  Cardiovascular: Patient has a pacemaker. This is his 2nd one.  Appetite: Used to have a very good appetite. Now only eats about 2 meals/day of very small portions. Only ate 1/2 of a breakfast sandwich this morning.  Reports no appetite and only eating because he has too. Is no longer enjoyable. Used to weigh 250 lbs a few months ago according to patient (documentation from 05/2021 says 230 lbs) and now weighs 209 lbs.   Skin: Multiple scabs and bruises noted on bilateral arms patient says from when he fell in the ditch and had to roll in gravel to get himself out.  Sleep: Having difficulty sleeping at night. Only sleeping about 2-3 hours. Trazodone recently decreased to 50 mg due to side effects. Patient had difficulty describing how this medication makes him feel, but does feel "cloudy" and has had weird dreams. He says he also takes Melatonin.   Palliative care team was asked to follow patient for goals of care, symptom management and complex decision making. Patient is agreeable to future visits with palliative care. Will schedule next visit with Dr. Bufford Spikes.  CODE STATUS: Full code MOST FORM: yes PPS: 50%   PHYSICAL EXAM:   VITALS: Today's Vitals   03/01/22 1134  BP: 132/82  Pulse: 99  Resp: 20  Temp: 97.6 F (36.4 C)  TempSrc: Temporal  SpO2: 96%  PainSc: 5   PainLoc: Generalized    LUNGS: clear to auscultation , sob w/exertion, occasional nonproductive cough CARDIAC: Cor RRR EXTREMITIES: No edema SKIN:  Multiple bruises and scabs noted to arms bilaterally   NEURO:  Poor short term memory, very forgetful, increased generalized weakness, unsteady gait, ambulates w/walker, still drives on occasion   Candiss Norse, Market researcher

## 2022-03-02 ENCOUNTER — Encounter: Payer: Self-pay | Admitting: Cardiology

## 2022-03-02 DIAGNOSIS — I110 Hypertensive heart disease with heart failure: Secondary | ICD-10-CM | POA: Diagnosis not present

## 2022-03-02 DIAGNOSIS — I4891 Unspecified atrial fibrillation: Secondary | ICD-10-CM | POA: Diagnosis not present

## 2022-03-02 DIAGNOSIS — I350 Nonrheumatic aortic (valve) stenosis: Secondary | ICD-10-CM | POA: Diagnosis not present

## 2022-03-02 DIAGNOSIS — I5033 Acute on chronic diastolic (congestive) heart failure: Secondary | ICD-10-CM | POA: Diagnosis not present

## 2022-03-02 DIAGNOSIS — I480 Paroxysmal atrial fibrillation: Secondary | ICD-10-CM | POA: Diagnosis not present

## 2022-03-02 DIAGNOSIS — J90 Pleural effusion, not elsewhere classified: Secondary | ICD-10-CM | POA: Diagnosis not present

## 2022-03-03 ENCOUNTER — Ambulatory Visit (INDEPENDENT_AMBULATORY_CARE_PROVIDER_SITE_OTHER): Payer: Medicare PPO

## 2022-03-03 ENCOUNTER — Ambulatory Visit: Payer: Medicare PPO | Admitting: Nurse Practitioner

## 2022-03-03 DIAGNOSIS — I5033 Acute on chronic diastolic (congestive) heart failure: Secondary | ICD-10-CM | POA: Diagnosis not present

## 2022-03-03 DIAGNOSIS — I495 Sick sinus syndrome: Secondary | ICD-10-CM | POA: Diagnosis not present

## 2022-03-03 DIAGNOSIS — I110 Hypertensive heart disease with heart failure: Secondary | ICD-10-CM | POA: Diagnosis not present

## 2022-03-03 DIAGNOSIS — I480 Paroxysmal atrial fibrillation: Secondary | ICD-10-CM | POA: Diagnosis not present

## 2022-03-03 LAB — CUP PACEART REMOTE DEVICE CHECK
Battery Remaining Longevity: 27 mo
Battery Voltage: 2.94 V
Brady Statistic RA Percent Paced: 3.19 %
Brady Statistic RV Percent Paced: 15.25 %
Date Time Interrogation Session: 20230727151922
Implantable Lead Implant Date: 20031201
Implantable Lead Implant Date: 20031201
Implantable Lead Location: 753859
Implantable Lead Location: 753860
Implantable Lead Model: 5076
Implantable Lead Model: 5076
Implantable Pulse Generator Implant Date: 20161014
Lead Channel Impedance Value: 323 Ohm
Lead Channel Impedance Value: 361 Ohm
Lead Channel Impedance Value: 418 Ohm
Lead Channel Impedance Value: 475 Ohm
Lead Channel Pacing Threshold Amplitude: 0.875 V
Lead Channel Pacing Threshold Amplitude: 1.5 V
Lead Channel Pacing Threshold Pulse Width: 0.4 ms
Lead Channel Pacing Threshold Pulse Width: 0.4 ms
Lead Channel Sensing Intrinsic Amplitude: 0.625 mV
Lead Channel Sensing Intrinsic Amplitude: 0.625 mV
Lead Channel Sensing Intrinsic Amplitude: 11.125 mV
Lead Channel Sensing Intrinsic Amplitude: 11.125 mV
Lead Channel Setting Pacing Amplitude: 2.5 V
Lead Channel Setting Pacing Amplitude: 3 V
Lead Channel Setting Pacing Pulse Width: 0.4 ms
Lead Channel Setting Sensing Sensitivity: 2.8 mV

## 2022-03-03 NOTE — Progress Notes (Deleted)
@Patient  ID: , male    DOB: 11/23/1930, 88 y.o.   MRN: [id number]  No chief complaint on file.   Referring provider: [id number], MD  HPI: 88 year old male, former smoker followed for emphysema and chronic cough.  He is a patient of Dr. 98 and last seen in office 05/12/2021.  Past medical history significant for hypertension, sick sinus syndrome, A-fib on Eliquis, CHF, hypertension, OA, HLD.  TEST/EVENTS:  05/02/2021 CT chest without contrast: Atherosclerosis.  Pacemaker in place.  Esophagus unremarkable.  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  Centrilobular emphysema and central peribronchial thickening consistent with COPD.  Minimal linear subsegmental atelectasis right lower lobe, left lower lobe and base of the lingula.  5 mm left lower lobe nodule.  There is a calcified granuloma within the liver.  05/12/2021: OV with Dr. 07/12/2021.  Initially seen in July 2022 for consult for chronic cough, which she had had for about 3 to 4 months beforehand.  He was treated for GERD prior to his visit by his PCP.  Treated him for protracted bronchitis with doxycycline and prednisone burst.  Chest x-ray was unremarkable.  Advised that he use Flonase for postnasal drip.  He then followed up in October, with over 5 months of cough.  Improved at this OV.  He has been on steroids and antibiotics already.  Started Trelegy 2 days prior to the visit.  CT scan of the chest showed some emphysema and a small lung nodule.  Does have nasal stuffiness and congestion with postnasal drip; Flonase has not helped much.  Repeat CT scan of the chest in a year for lung nodule.  03/03/2022: Today-  No Known Allergies  Immunization History  Administered Date(s) Administered   Influenza Inj Mdck Quad Pf 06/25/2019   Influenza,inj,Quad PF,6+ Mos 05/20/2018   Influenza,inj,quad, With Preservative 05/20/2018, 06/25/2019   Influenza-Unspecified 05/07/2018   Moderna  Sars-Covid-2 Vaccination 09/18/2019, 10/13/2019, 07/27/2020    Past Medical History:  Diagnosis Date   Arthritis    Dyslipidemia    GERD (gastroesophageal reflux disease)    HTN (hypertension)    Paroxysmal atrial fibrillation (HCC)    Symptomatic bradycardia    a. s/p MDT dual chamber PPM    Tobacco History: Social History   Tobacco Use  Smoking Status Former   Packs/day: 3.00   Years: 15.00   Total pack years: 45.00   Types: Cigarettes   Start date: 17   Quit date: 12/04/1963   Years since quitting: 58.2  Smokeless Tobacco Never   Counseling given: Not Answered   Outpatient Medications Prior to Visit  Medication Sig Dispense Refill   acetaminophen (TYLENOL) 500 MG tablet Take 1,500 mg by mouth daily.      donepezil (ARICEPT) 10 MG tablet Take 10 mg by mouth daily.      dorzolamide-timolol (COSOPT) 22.3-6.8 MG/ML ophthalmic solution Place 1 drop into the right eye 2 (two) times daily.     Fluticasone-Umeclidin-Vilant (TRELEGY ELLIPTA) 100-62.5-25 MCG/ACT AEPB INHALE 1 PUFF INTO THE LUNGS ONCE DAILY 180 each 3   folic acid (FOLVITE) 1 MG tablet Take 1 mg by mouth daily.      furosemide (LASIX) 40 MG tablet Take 1 tablet (40 mg total) by mouth daily. 90 tablet 3   ipratropium (ATROVENT) 0.06 % nasal spray Place 2 sprays into both nostrils 4 (four) times daily. 15 mL 6   losartan (COZAAR) 100 MG tablet Take 100 mg by  mouth daily as needed (High blood pressure).      Melatonin 10 MG TABS Take 10 mg by mouth at bedtime.      methotrexate 50 MG/2ML injection Inject 20 mg into the skin every Thursday. 0.8 ml in stomach     Misc Natural Products (OSTEO BI-FLEX ADV JOINT SHIELD PO) Take 3 tablets by mouth daily.     Omega-3 Fatty Acids (FISH OIL) 1000 MG CAPS Take 3,000 mg by mouth daily.      promethazine-codeine (PHENERGAN WITH CODEINE) 6.25-10 MG/5ML syrup Take 5 mLs by mouth every 6 (six) hours as needed for cough. (Patient not taking: Reported on 01/16/2022) 120 mL 0    simvastatin (ZOCOR) 20 MG tablet Take 20 mg by mouth at bedtime.     tamsulosin (FLOMAX) 0.4 MG CAPS capsule Take 0.4 mg by mouth daily.      trazodone (DESYREL) 300 MG tablet Take 150 mg by mouth at bedtime.      XARELTO 20 MG TABS tablet TAKE 1 TABLET EVERY DAY 90 tablet 1   No facility-administered medications prior to visit.     Review of Systems:   Constitutional: No weight loss or gain, night sweats, fevers, chills, fatigue, or lassitude. HEENT: No headaches, difficulty swallowing, tooth/dental problems, or sore throat. No sneezing, itching, ear ache, nasal congestion, or post nasal drip CV:  No chest pain, orthopnea, PND, swelling in lower extremities, anasarca, dizziness, palpitations, syncope Resp: No shortness of breath with exertion or at rest. No excess mucus or change in color of mucus. No productive or non-productive. No hemoptysis. No wheezing.  No chest wall deformity GI:  No heartburn, indigestion, abdominal pain, nausea, vomiting, diarrhea, change in bowel habits, loss of appetite, bloody stools.  GU: No dysuria, change in color of urine, urgency or frequency.  No flank pain, no hematuria  Skin: No rash, lesions, ulcerations MSK:  No joint pain or swelling.  No decreased range of motion.  No back pain. Neuro: No dizziness or lightheadedness.  Psych: No depression or anxiety. Mood stable.     Physical Exam:  There were no vitals taken for this visit.  GEN: Pleasant, interactive, well-nourished/chronically-ill appearing/acutely-ill appearing/poorly-nourished/morbidly obese; in no acute distress.****** HEENT:  Normocephalic and atraumatic. EACs patent bilaterally. TM pearly gray with present light reflex bilaterally. PERRLA. Sclera white. Nasal turbinates pink, moist and patent bilaterally. No rhinorrhea present. Oropharynx pink and moist, without exudate or edema. No lesions, ulcerations, or postnasal drip.  NECK:  Supple w/ fair ROM. No JVD present. Normal carotid  impulses w/o bruits. Thyroid symmetrical with no goiter or nodules palpated. No lymphadenopathy.   CV: RRR, no m/r/g, no peripheral edema. Pulses intact, +2 bilaterally. No cyanosis, pallor or clubbing. PULMONARY:  Unlabored, regular breathing. Clear bilaterally A&P w/o wheezes/rales/rhonchi. No accessory muscle use. No dullness to percussion. GI: BS present and normoactive. Soft, non-tender to palpation. No organomegaly or masses detected. No CVA tenderness. MSK: No erythema, warmth or tenderness. Cap refil <2 sec all extrem. No deformities or joint swelling noted.  Neuro: A/Ox3. No focal deficits noted.   Skin: Warm, no lesions or rashe Psych: Normal affect and behavior. Judgement and thought content appropriate.     Lab Results:  CBC    Component Value Date/Time   WBC 7.5 12/03/2019 1441   WBC 6.2 05/21/2015 1600   RBC 4.34 12/03/2019 1441   RBC 4.47 05/21/2015 1600   HGB 12.9 (L) 12/08/2019 0950   HGB 14.7 12/03/2019 1441   HCT 38.0 (L)  12/08/2019 0950   HCT 43.4 12/03/2019 1441   PLT 175 12/03/2019 1441   MCV 100 (H) 12/03/2019 1441   MCH 33.9 (H) 12/03/2019 1441   MCH 33.3 05/21/2015 1600   MCHC 33.9 12/03/2019 1441   MCHC 34.3 05/21/2015 1600   RDW 13.3 12/03/2019 1441   LYMPHSABS 1.3 12/03/2019 1441   MONOABS 0.4 02/04/2007 1257   EOSABS 0.2 12/03/2019 1441   BASOSABS 0.1 12/03/2019 1441    BMET    Component Value Date/Time   NA 144 12/08/2019 0950   NA 142 12/03/2019 1441   K 3.5 12/08/2019 0950   CL 105 12/03/2019 1441   CO2 24 12/03/2019 1441   GLUCOSE 89 12/03/2019 1441   GLUCOSE 100 (H) 05/21/2015 1604   BUN 18 12/03/2019 1441   CREATININE 0.98 12/03/2019 1441   CALCIUM 9.9 12/03/2019 1441   GFRNONAA 70 12/03/2019 1441   GFRAA 80 12/03/2019 1441    BNP No results found for: "BNP"   Imaging:  No results found.        No data to display          No results found for: "NITRICOXIDE"      Assessment & Plan:   No problem-specific  Assessment & Plan notes found for this encounter.   I spent *** minutes of dedicated to the care of this patient on the date of this encounter to include pre-visit review of records, face-to-face time with the patient discussing conditions above, post visit ordering of testing, clinical documentation with the electronic health record, making appropriate referrals as documented, and communicating necessary findings to members of the patients care team.  Noemi Chapel, NP 03/03/2022  Pt aware and understands NP's role.

## 2022-03-04 DIAGNOSIS — I5033 Acute on chronic diastolic (congestive) heart failure: Secondary | ICD-10-CM | POA: Diagnosis not present

## 2022-03-04 DIAGNOSIS — I480 Paroxysmal atrial fibrillation: Secondary | ICD-10-CM | POA: Diagnosis not present

## 2022-03-04 DIAGNOSIS — I110 Hypertensive heart disease with heart failure: Secondary | ICD-10-CM | POA: Diagnosis not present

## 2022-03-07 DIAGNOSIS — M1990 Unspecified osteoarthritis, unspecified site: Secondary | ICD-10-CM | POA: Diagnosis not present

## 2022-03-07 DIAGNOSIS — E782 Mixed hyperlipidemia: Secondary | ICD-10-CM | POA: Diagnosis not present

## 2022-03-08 DIAGNOSIS — F0150 Vascular dementia without behavioral disturbance: Secondary | ICD-10-CM | POA: Diagnosis not present

## 2022-03-08 DIAGNOSIS — I482 Chronic atrial fibrillation, unspecified: Secondary | ICD-10-CM | POA: Diagnosis not present

## 2022-03-08 DIAGNOSIS — R609 Edema, unspecified: Secondary | ICD-10-CM | POA: Diagnosis not present

## 2022-03-08 DIAGNOSIS — Z7689 Persons encountering health services in other specified circumstances: Secondary | ICD-10-CM | POA: Diagnosis not present

## 2022-03-08 DIAGNOSIS — I5030 Unspecified diastolic (congestive) heart failure: Secondary | ICD-10-CM | POA: Diagnosis not present

## 2022-03-08 DIAGNOSIS — Z6827 Body mass index (BMI) 27.0-27.9, adult: Secondary | ICD-10-CM | POA: Diagnosis not present

## 2022-03-13 DIAGNOSIS — F0150 Vascular dementia without behavioral disturbance: Secondary | ICD-10-CM | POA: Diagnosis not present

## 2022-03-13 DIAGNOSIS — Z66 Do not resuscitate: Secondary | ICD-10-CM | POA: Diagnosis not present

## 2022-03-13 DIAGNOSIS — R609 Edema, unspecified: Secondary | ICD-10-CM | POA: Diagnosis not present

## 2022-03-13 DIAGNOSIS — Z6826 Body mass index (BMI) 26.0-26.9, adult: Secondary | ICD-10-CM | POA: Diagnosis not present

## 2022-03-22 NOTE — Progress Notes (Signed)
Remote pacemaker transmission.   

## 2022-04-07 DIAGNOSIS — M47816 Spondylosis without myelopathy or radiculopathy, lumbar region: Secondary | ICD-10-CM | POA: Diagnosis not present

## 2022-04-07 DIAGNOSIS — E782 Mixed hyperlipidemia: Secondary | ICD-10-CM | POA: Diagnosis not present

## 2022-04-11 ENCOUNTER — Encounter: Payer: Self-pay | Admitting: Internal Medicine

## 2022-04-11 ENCOUNTER — Other Ambulatory Visit: Payer: Medicare PPO | Admitting: Internal Medicine

## 2022-04-11 VITALS — HR 51

## 2022-04-11 DIAGNOSIS — I480 Paroxysmal atrial fibrillation: Secondary | ICD-10-CM | POA: Diagnosis not present

## 2022-04-11 DIAGNOSIS — M170 Bilateral primary osteoarthritis of knee: Secondary | ICD-10-CM

## 2022-04-11 DIAGNOSIS — I5032 Chronic diastolic (congestive) heart failure: Secondary | ICD-10-CM

## 2022-04-11 DIAGNOSIS — Z515 Encounter for palliative care: Secondary | ICD-10-CM | POA: Diagnosis not present

## 2022-04-11 DIAGNOSIS — R413 Other amnesia: Secondary | ICD-10-CM

## 2022-04-11 DIAGNOSIS — Z8673 Personal history of transient ischemic attack (TIA), and cerebral infarction without residual deficits: Secondary | ICD-10-CM

## 2022-04-11 NOTE — Progress Notes (Signed)
[facility] Pall[facility] Care Follow-Up Visit Telephone: [phone]  Fax: [phone]   Date of encounter: 04/11/22 11:47 AM PATIENT NAME: Maurice Hall [address] [place] [address]   [phone] (home)  DOB: 1931/04/17 MRN: [id number] PRIMARY CARE PROVIDER:    Maryella Shivers, MD,  [place] [address] [phone]  REFERRING PROVIDER:   Maryella Shivers, [place],  [place] [address] [phone]  RESPONSIBLE PARTY:    Contact Information     Name Relation Home Work [place] Spouse [phone]  [phone]        I met face to face with patient and family in his home along with his DIL today. Palliative Care was asked to follow this patient by consultation request of  Maryella Shivers, MD to address advance care planning and complex medical decision making. This is follow-up visit.                                     ASSESSMENT AND PLAN / RECOMMENDATIONS:   Advance Care Planning/Goals of Care: Goals include to maximize quality of life and symptom management. Patient/health care surrogate gave his/her permission to discuss.Our advance care planning conversation included a discussion about:    The value and importance of advance care planning  Experiences with loved ones who have been seriously ill or have died  Exploration of personal, cultural or spiritual beliefs that might influence medical decisions  Exploration of goals of care in the event of a sudden injury or illness  Identification  of a healthcare agent--his wife had been his HCPOA and she just passed away last week.  He says his local son, Shanon Brow, will be new HCPOA when completed.  [phone] Review and updating or creation of an  advance directive document . Decision not to resuscitate or to de-escalate disease focused treatments due to poor prognosis. CODE STATUS:  DNR; MOST completed:  DNR,  limited, determine use or limitation of abx, IVF trial, NO feeding tube.    Symptom Management/Plan: 1. Chronic diastolic heart failure (HCC) -appears to be doing a little better with is breathing per his DIL vs last week, no rales or edema, continue same mgt and monitor, avoid high sodium foods as much as possible--family going to get him grocery delivery and bring him some items he needs  2. Memory loss -likely secondary to prior stroke so vascular -would advise against driving, but he was defensive on this topic before I even suggested it  3. History of stroke -notable with afib and now memory loss  4. Primary osteoarthritis of both knees -continue current mgt per primary and injections for back from specialist  5. Paroxysmal atrial fibrillation (HCC) -has PPM so rate controlled now, no complaints of palpitations   6. Palliative care encounter -goals reviewed above and MOST completed; continue to follow; family is already arranging for increased help for him in the home which he's agreeable to today    Follow up Palliative Care Visit: Palliative care will continue to follow for complex medical decision making, advance care planning, and clarification of goals. Return 06/15/2022 and prn.  This visit was coded based on medical decision making (MDM). 31 mins spent on ACP.  PPS: 50%  HOSPICE ELIGIBILITY/DIAGNOSIS: TBD  Chief Complaint: Follow-up palliative visit  HISTORY OF PRESENT ILLNESS:  KAZMIR OKI is a  88 y.o. year old male  with memory loss (likely early vascular dementia), sick sinus syndrome with p afib s/p PPM, chronic diastolic chf with class 2 symptoms, weight loss, and now grief after loss of his wife just last week.  He lives alone in his home in [place]. His local son, Shanon Brow, and his wife have stepped in to help him.  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  Also taught in [place] and [place] and even a year in [place].   He's a devout [place].  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    He's having some difficulty with short-term memory.  Losing items in the home.  He still drives, but not at night and only to the store.  He did fine when his daughter rode with him.  He goes slowly.  He would not drive out of town.    He's HOH and has hearing aids.    He makes his own breakfast (oatmeal and fruit) or a trip to [employer].  Family is making sure he has groceries and food.  Stays alone at night.  Had some difficulty with sleeping and on med for it.  He says he falls asleep.   His DIL also does his med planner.  His eldest son lives in [place].  Daughter lives in [place].  They visit.    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    He's talked about downsizing and selling the home and moving into a facility.  It will take a year to go through things, he says.    He does mow on the riding mower.  Loves the outdoors.    He is a bit winded when speaks.  Sob is worse in past week.  Dr. Nyra Capes.  He does not have any trelegy now.    He was getting methotrexate injections in his abdomen for his arthritis.  Shanon Brow was giving it, then [place].  [facility] didn't have any.  Has low back pain and arthritic pains.  Had gotten injections.    He was wanting to set up [employer] home delivery and family will set up that or [employer].  History obtained from review of EMR, discussion with primary team, and interview with family, facility staff/caregiver and/or Maurice Hall.  I reviewed available labs, medications, imaging, studies and related documents from the EMR.  Records reviewed and summarized above.   ROS Review of Systems see hpi  Physical Exam:  Wt Readings from Last 500 Encounters:  01/16/22 218 lb 6.4 oz (99.1 kg)  07/11/21 222 lb (100.7 kg)  05/12/21 230 lb 3.2 oz (104.4 kg)  04/13/21 219 lb 9.6 oz (99.6 kg)  03/04/21 222 lb 3.2 oz (100.8  kg)  11/10/20 218 lb (98.9 kg)  04/28/20 218 lb (98.9 kg)  12/08/19 220 lb (99.8 kg)  12/03/19 218 lb (98.9 kg)  05/27/19 214 lb (97.1 kg)  04/03/19 217 lb 3.2 oz (98.5 kg)  07/15/18 232 lb (105.2 kg)  01/16/18 222 lb (100.7 kg)  07/18/17 231 lb (104.8 kg)  06/23/16 232 lb (105.2 kg)  08/24/15 235 lb 3.2 oz (106.7 kg)  05/21/15 228 lb (103.4 kg)  07/14/14 233 lb 12.8 oz (106.1 kg)  07/08/13 234 lb (106.1 kg)  03/11/13 230 lb (104.3 kg)  02/25/13 228 lb 9.6 oz (103.7 kg)  06/04/12 227 lb (103  kg)  12/11/11 225 lb (102.1 kg)  12/04/11 225 lb (102.1 kg)  06/06/11 228 lb 12.8 oz (103.8 kg)   Physical Exam Constitutional:      General: He is not in acute distress.    Appearance: Normal appearance.  HENT:     Head: Normocephalic and atraumatic.     Ears:     Comments: HOH with hearing aids in place Eyes:     Comments: Blind in one eye  Cardiovascular:     Rate and Rhythm: Rhythm irregular.     Heart sounds: No murmur heard. Pulmonary:     Breath sounds: Normal breath sounds. No rales.     Comments: Dyspneic when walks around or talks for a prolonged time Abdominal:     General: Bowel sounds are normal.     Palpations: Abdomen is soft.  Musculoskeletal:        General: Normal range of motion.     Comments: Ambulates with cane, a bit unsteady on first standing  Skin:    General: Skin is warm and dry.     Coloration: Skin is pale.  Neurological:     Mental Status: He is alert.  Psychiatric:        Mood and Affect: Mood normal.     Comments: Somewhat flat affect, some sarcastic humor though     CURRENT PROBLEM LIST:  Patient Active Problem List   Diagnosis Date Noted   Aortic atherosclerosis ([facility]) 10/13/2021   Chronic diastolic heart failure ([facility]) 10/13/2021   Congestive heart failure ([facility]) 07/18/2019   Cerebrovascular accident ([facility]) 07/18/2019   Hypertensive disorder 07/18/2019   Dyspnea 07/14/2014   Atrial fibrillation ([facility]) 07/08/2013   OA (osteoarthritis) of  knee 12/11/2011   Dyslipidemia 06/02/2009   ESSENTIAL HYPERTENSION, BENIGN 06/02/2009   SICK SINUS SYNDROME 06/02/2009   PPM-Medtronic 06/02/2009    PAST MEDICAL HISTORY:  Active Ambulatory Problems    Diagnosis Date Noted   Dyslipidemia 06/02/2009   ESSENTIAL HYPERTENSION, BENIGN 06/02/2009   SICK SINUS SYNDROME 06/02/2009   PPM-Medtronic 06/02/2009   OA (osteoarthritis) of knee 12/11/2011   Atrial fibrillation ([facility]) 07/08/2013   Dyspnea 07/14/2014   Congestive heart failure ([facility]) 07/18/2019   Cerebrovascular accident ([facility]) 07/18/2019   Hypertensive disorder 07/18/2019   Aortic atherosclerosis ([facility]) 10/13/2021   Chronic diastolic heart failure ([facility]) 10/13/2021   Resolved Ambulatory Problems    Diagnosis Date Noted   No Resolved Ambulatory Problems   Past Medical History:  Diagnosis Date   Arthritis    GERD (gastroesophageal reflux disease)    HTN (hypertension)    Paroxysmal atrial fibrillation (HCC)    Symptomatic bradycardia     SOCIAL HX:  Social History   Tobacco Use   Smoking status: Former    Packs/day: 3.00    Years: 15.00    Total pack years: 45.00    Types: Cigarettes    Start date: 50    Quit date: 12/04/1963    Years since quitting: 58.3   Smokeless tobacco: Never  Substance Use Topics   Alcohol use: No     ALLERGIES: No Known Allergies    PERTINENT MEDICATIONS:  Outpatient Encounter Medications as of 04/11/2022  Medication Sig   acetaminophen (TYLENOL) 500 MG tablet Take 1,500 mg by mouth daily.    donepezil (ARICEPT) 10 MG tablet Take 10 mg by mouth daily.    dorzolamide-timolol (COSOPT) 22.3-6.8 MG/ML ophthalmic solution Place 1 drop into the right eye 2 (two) times daily.   Fluticasone-Umeclidin-Vilant (TRELEGY  ELLIPTA) 100-62.5-25 MCG/ACT AEPB INHALE 1 PUFF INTO THE LUNGS ONCE DAILY   folic acid (FOLVITE) 1 MG tablet Take 1 mg by mouth daily.    furosemide (LASIX) 40 MG tablet Take 1 tablet (40 mg total) by mouth daily.   ipratropium  (ATROVENT) 0.06 % nasal spray Place 2 sprays into both nostrils 4 (four) times daily.   losartan (COZAAR) 100 MG tablet Take 100 mg by mouth daily as needed (High blood pressure).    Melatonin 10 MG TABS Take 10 mg by mouth at bedtime.    methotrexate 50 MG/2ML injection Inject 20 mg into the skin every Thursday. 0.8 ml in stomach   Misc Natural Products (OSTEO BI-FLEX ADV JOINT SHIELD PO) Take 3 tablets by mouth daily.   Omega-3 Fatty Acids (FISH OIL) 1000 MG CAPS Take 3,000 mg by mouth daily.    promethazine-codeine (PHENERGAN WITH CODEINE) 6.25-10 MG/5ML syrup Take 5 mLs by mouth every 6 (six) hours as needed for cough. (Patient not taking: Reported on 01/16/2022)   simvastatin (ZOCOR) 20 MG tablet Take 20 mg by mouth at bedtime.   tamsulosin (FLOMAX) 0.4 MG CAPS capsule Take 0.4 mg by mouth daily.    trazodone (DESYREL) 300 MG tablet Take 150 mg by mouth at bedtime.    XARELTO 20 MG TABS tablet TAKE 1 TABLET EVERY DAY   No facility-administered encounter medications on file as of 04/11/2022.    Thank you for the opportunity to participate in the care of Maurice Hall.  The palliative care team will continue to follow. Please call our office at [phone] if we can be of additional assistance.   Hollace Kinnier, DO  COVID-19 PATIENT SCREENING TOOL Asked and negative response unless otherwise noted:  Have you had symptoms of covid, tested positive or been in contact with someone with symptoms/positive test in the past 5-10 days? No

## 2022-04-27 DIAGNOSIS — M5416 Radiculopathy, lumbar region: Secondary | ICD-10-CM | POA: Diagnosis not present

## 2022-04-27 IMAGING — MR MR LUMBAR SPINE W/O CM
4 of 5 series · 17 of 48 positions shown · non-contrast
Comparison: Prior radiograph from 05/12/2020.

CLINICAL DATA: Initial evaluation for back pain.

EXAM:
MRI LUMBAR SPINE WITHOUT CONTRAST
TECHNIQUE: Multiplanar, multisequence MR imaging of the lumbar spine was
performed. No intravenous contrast was administered.

[Series 3: T2 · sagittal · 4.0mm · 0.55mm/px · 6 of 16 slices shown (1 of 2)]
[im 1/16]
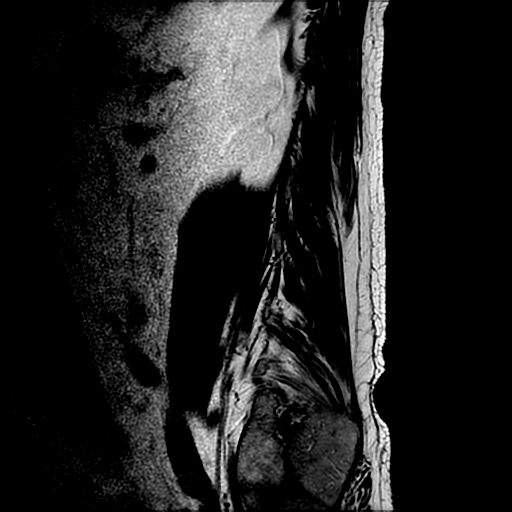
[im 4/16]
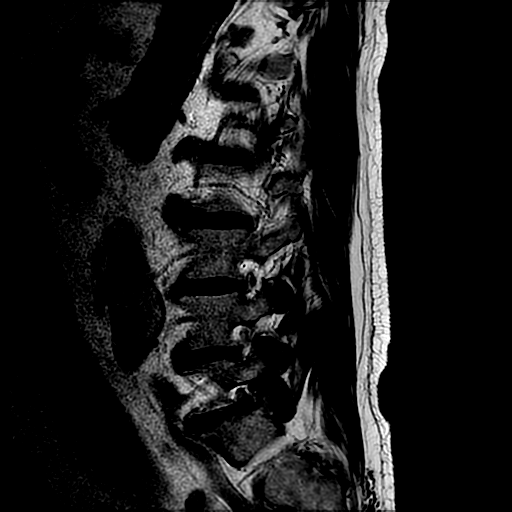
[im 7/16]
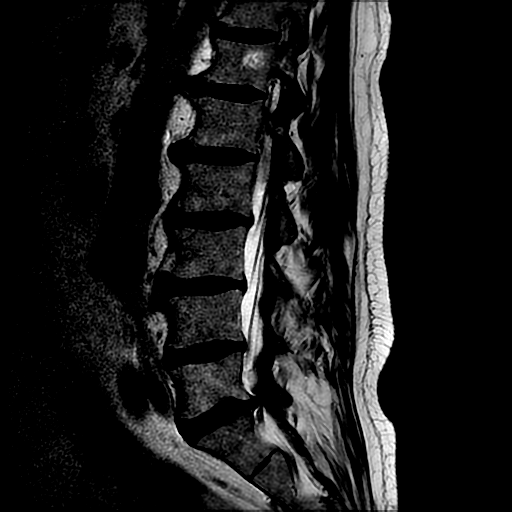
[im 10/16]
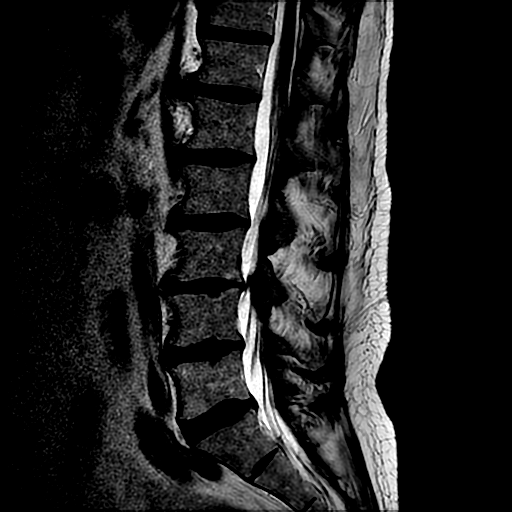
[im 13/16]
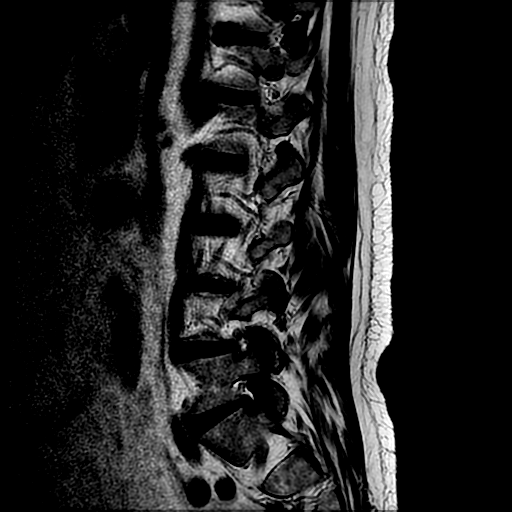
[im 16/16]
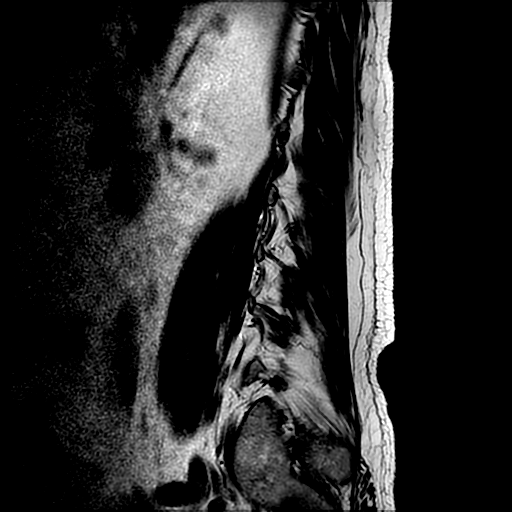

[Series 5: T1 · sagittal · 4.0mm · 0.55mm/px · 3 of 16 slices shown (1 of 2)]
[im 4/16]
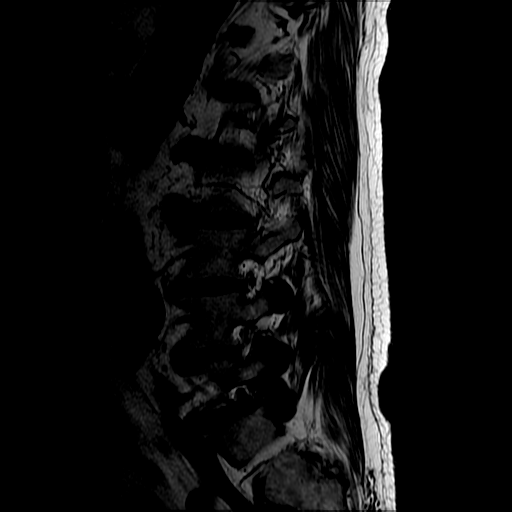
[im 10/16]
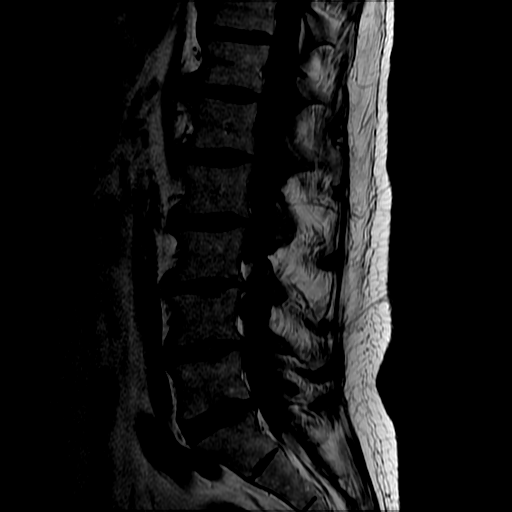
[im 16/16]
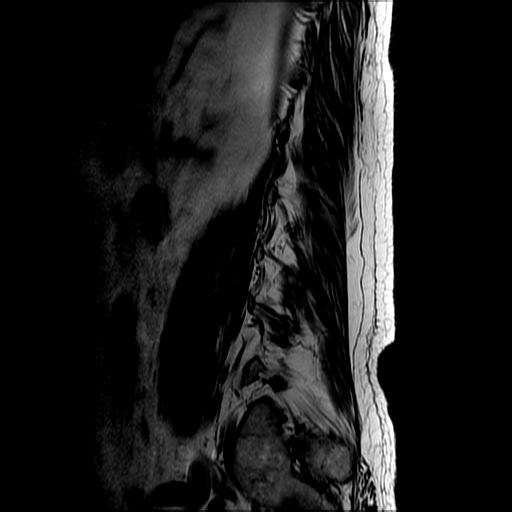

[Series 6: T2 · axial · 4.0mm · 0.39mm/px · z∈[-145,+29]mm · 5 of 39 slices shown (2 of 2)]
[im 1/39]
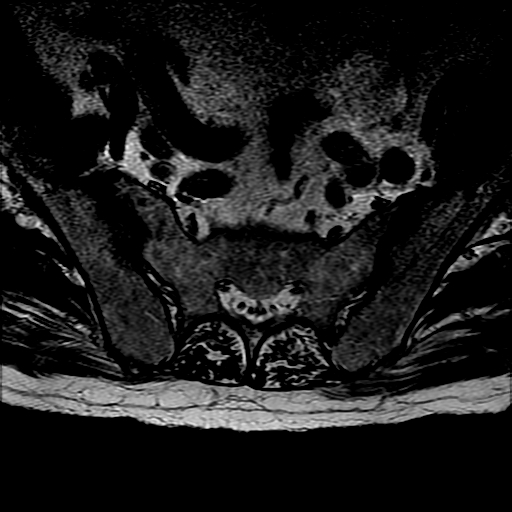
[im 6/39]
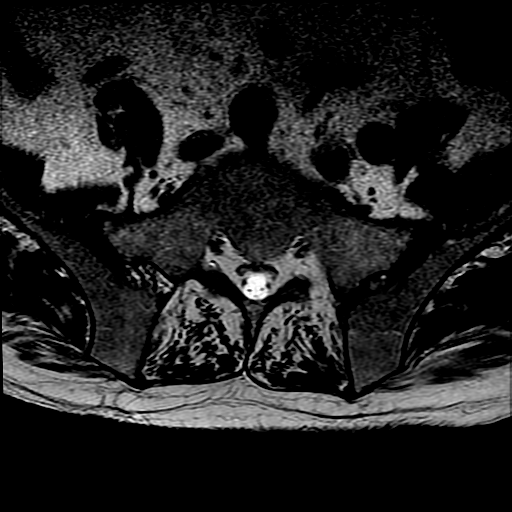
[im 11/39]
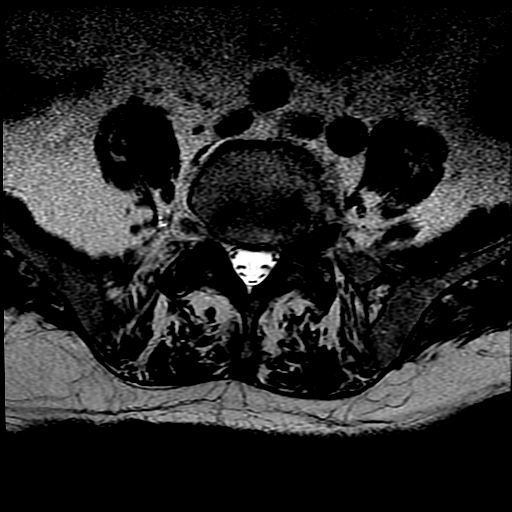
[im 20/39]
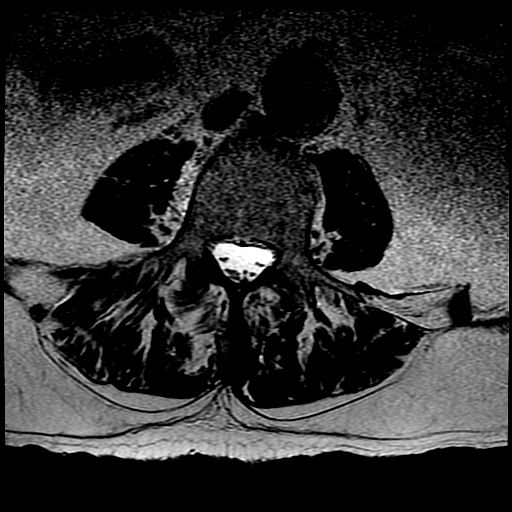
[im 33/39]
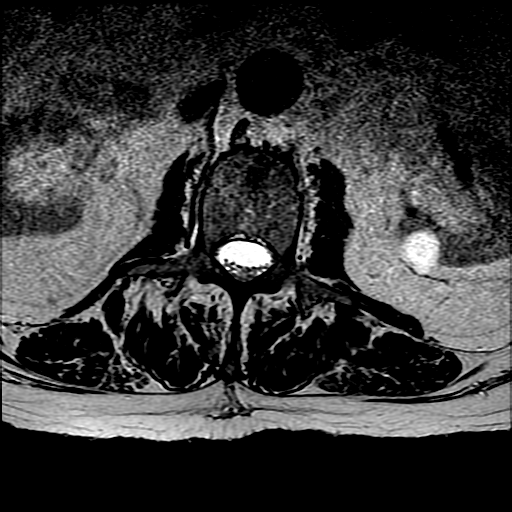

[Series 7: T1 · axial · 4.0mm · 0.39mm/px · z∈[-121,+29]mm · 3 of 39 slices shown (2 of 2)]
[im 6/39]
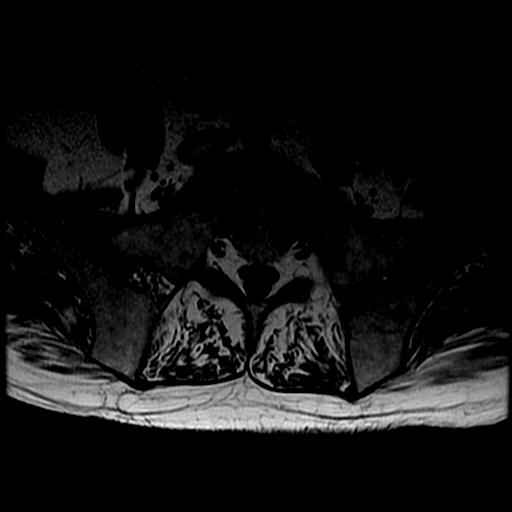
[im 20/39]
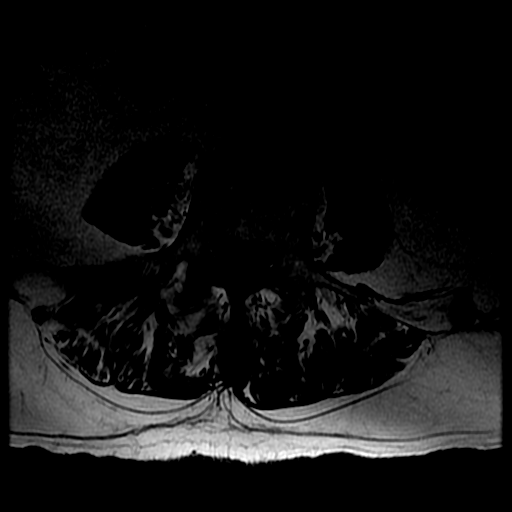
[im 33/39]
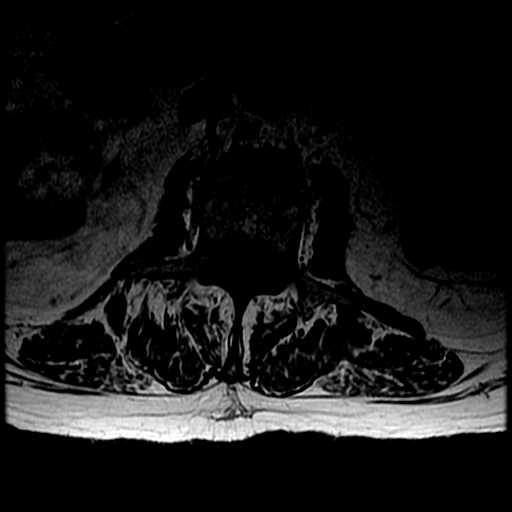

[17 of 48 positions shown; findings below may reference images not displayed]

FINDINGS: Segmentation: Standard. Lowest well-formed disc space labeled the
L5-S1 level.

Alignment: Trace levoscoliosis. Alignment otherwise normal with
preservation of the normal lumbar lordosis. No listhesis.

Vertebrae: Vertebral body height maintained without acute or chronic
fracture. Bone marrow signal intensity within normal limits. 1 cm
benign hemangioma noted within the T12 vertebral body. No other
discrete or worrisome osseous lesions. No abnormal marrow edema.

Conus medullaris and cauda equina: Conus extends to the L1 level.
Conus and cauda equina appear normal.

Paraspinal and other soft tissues: Paraspinous soft tissues within
normal limits. Subcentimeter T2 hypointense/T1 hyperintense
exophytic lesion extending from the posterior right kidney noted,
nonspecific, but could reflect a small proteinaceous and/or
hemorrhagic cyst. Few additional simple cyst noted within the left
kidney, largest of which measures 1.8 cm at the interpolar region.
Infrarenal aortic aneurysm measuring up to 3.5 cm noted (series 6,
image 16).

Disc levels:

T11-12: Negative interspace.  Mild facet hypertrophy.  No stenosis.

T12-L1: Disc desiccation without significant disc bulge. Prominent
anterior endplate osteophytic spurring. No significant canal or
foraminal stenosis.

L1-2: Disc desiccation with minimal disc bulge. Prominent anterior
endplate spurring. Mild facet hypertrophy. No significant canal or
foraminal stenosis.

L2-3: Mild diffuse disc bulge with disc desiccation. Mild bilateral
facet hypertrophy. No significant spinal stenosis. Foramina remain
patent.

L3-4: Mild diffuse disc bulge with disc desiccation and
intervertebral disc space narrowing. Superimposed broad-based right
extraforaminal disc protrusion closely approximates the exiting
right L3 nerve root without frank impingement (series 6, image 17).
Mild to moderate bilateral facet hypertrophy. Resultant mild right
foraminal and right lateral recess stenosis. Central canal remains
patent. No significant left foraminal narrowing.

L4-5: Mild disc bulge with disc desiccation and intervertebral disc
space narrowing. Mild to moderate bilateral facet hypertrophy. No
significant spinal stenosis. Mild right L4 foraminal narrowing. No
significant left foraminal encroachment.

L5-S1: Mild disc bulge with disc desiccation and intervertebral disc
space narrowing. Associated mild reactive endplate spurring.
Superimposed broad-based central disc protrusion with associated
annular fissure (series 6, image 30). Mild to moderate bilateral
facet hypertrophy. Resultant mild right with mild to moderate left
lateral recess stenosis (descending S1 nerve root level). Central
canal remains patent. Mild to moderate bilateral L5 foraminal
narrowing, left worse than right.
IMPRESSION: 1. Broad based posterior disc bulge and facet hypertrophy at L5-S1
with resultant mild to moderate left greater than right lateral
recess and foraminal stenosis.
2. Broad-based right extraforaminal disc protrusion at L3-4, closely
approximating and potentially irritating the exiting right L3 nerve
root.
3. Additional more mild multilevel degenerative disc disease and
facet hypertrophy as above, with no other significant stenosis or
frank neural impingement.
4. Infrarenal aortic aneurysm measuring up to 3.5 cm. Follow-up
ultrasound every 2 years recommended for further evaluation and
monitoring. This recommendation follows ACR consensus guidelines:
White Paper of the ACR Incidental Findings Committee II on Vascular
Findings. [HOSPITAL] 2728; [DATE].

## 2022-05-07 DIAGNOSIS — M47816 Spondylosis without myelopathy or radiculopathy, lumbar region: Secondary | ICD-10-CM | POA: Diagnosis not present

## 2022-05-07 DIAGNOSIS — E782 Mixed hyperlipidemia: Secondary | ICD-10-CM | POA: Diagnosis not present

## 2022-06-01 DIAGNOSIS — M62830 Muscle spasm of back: Secondary | ICD-10-CM | POA: Diagnosis not present

## 2022-06-01 DIAGNOSIS — Z6827 Body mass index (BMI) 27.0-27.9, adult: Secondary | ICD-10-CM | POA: Diagnosis not present

## 2022-06-02 ENCOUNTER — Ambulatory Visit (INDEPENDENT_AMBULATORY_CARE_PROVIDER_SITE_OTHER): Payer: Medicare PPO

## 2022-06-02 DIAGNOSIS — I495 Sick sinus syndrome: Secondary | ICD-10-CM

## 2022-06-05 LAB — CUP PACEART REMOTE DEVICE CHECK
Battery Remaining Longevity: 22 mo
Battery Voltage: 2.93 V
Brady Statistic AP VP Percent: 13.6 %
Brady Statistic AP VS Percent: 4.41 %
Brady Statistic AS VP Percent: 32.36 %
Brady Statistic AS VS Percent: 49.63 %
Brady Statistic RA Percent Paced: 12.98 %
Brady Statistic RV Percent Paced: 43.23 %
Date Time Interrogation Session: 20231030133249
Implantable Lead Connection Status: 753985
Implantable Lead Connection Status: 753985
Implantable Lead Implant Date: 20031201
Implantable Lead Implant Date: 20031201
Implantable Lead Location: 753859
Implantable Lead Location: 753860
Implantable Lead Model: 5076
Implantable Lead Model: 5076
Implantable Pulse Generator Implant Date: 20161014
Lead Channel Impedance Value: 285 Ohm
Lead Channel Impedance Value: 361 Ohm
Lead Channel Impedance Value: 399 Ohm
Lead Channel Impedance Value: 456 Ohm
Lead Channel Pacing Threshold Amplitude: 0.875 V
Lead Channel Pacing Threshold Amplitude: 1.5 V
Lead Channel Pacing Threshold Pulse Width: 0.4 ms
Lead Channel Pacing Threshold Pulse Width: 0.4 ms
Lead Channel Sensing Intrinsic Amplitude: 0.375 mV
Lead Channel Sensing Intrinsic Amplitude: 0.375 mV
Lead Channel Sensing Intrinsic Amplitude: 11.375 mV
Lead Channel Sensing Intrinsic Amplitude: 11.375 mV
Lead Channel Setting Pacing Amplitude: 2.5 V
Lead Channel Setting Pacing Amplitude: 3 V
Lead Channel Setting Pacing Pulse Width: 0.4 ms
Lead Channel Setting Sensing Sensitivity: 2.8 mV
Zone Setting Status: 755011
Zone Setting Status: 755011

## 2022-06-07 DIAGNOSIS — E782 Mixed hyperlipidemia: Secondary | ICD-10-CM | POA: Diagnosis not present

## 2022-06-07 DIAGNOSIS — M47816 Spondylosis without myelopathy or radiculopathy, lumbar region: Secondary | ICD-10-CM | POA: Diagnosis not present

## 2022-06-08 NOTE — Progress Notes (Signed)
Remote pacemaker transmission.   

## 2022-06-15 ENCOUNTER — Encounter: Payer: Self-pay | Admitting: Internal Medicine

## 2022-06-15 ENCOUNTER — Other Ambulatory Visit: Payer: Medicare PPO | Admitting: Internal Medicine

## 2022-06-15 VITALS — BP 116/68 | HR 63 | Temp 97.9°F | Wt 193.0 lb

## 2022-06-15 DIAGNOSIS — Z515 Encounter for palliative care: Secondary | ICD-10-CM

## 2022-06-15 DIAGNOSIS — I5032 Chronic diastolic (congestive) heart failure: Secondary | ICD-10-CM

## 2022-06-15 DIAGNOSIS — R413 Other amnesia: Secondary | ICD-10-CM

## 2022-06-15 DIAGNOSIS — Z8673 Personal history of transient ischemic attack (TIA), and cerebral infarction without residual deficits: Secondary | ICD-10-CM

## 2022-06-15 DIAGNOSIS — M170 Bilateral primary osteoarthritis of knee: Secondary | ICD-10-CM

## 2022-06-15 NOTE — Progress Notes (Signed)
[facility] Follow-Up Visit Telephone: [phone]  Fax: [phone]   Date of encounter: 06/15/22 1:42 PM PATIENT NAME: Maurice Hall [address] [place] [address]   [phone] (home)  DOB: 09-29-1930 MRN: [id number] PRIMARY CARE PROVIDER:    Maryella Shivers, MD,  [place] [address] [phone]  REFERRING PROVIDER:   Maryella Shivers, [place],  [place] [address] [phone]  RESPONSIBLE PARTY:    Contact Information     Name Relation Home Work [place] Spouse [phone]  [phone]        I met face to face with patient and family in his home. Palliative Care was asked to follow this patient by consultation request of  Maryella Shivers, MD to address advance care planning and complex medical decision making. This is follow-up visit.                                     ASSESSMENT AND PLAN / RECOMMENDATIONS:   Advance Care Planning/Goals of Care: Goals include to maximize quality of life and symptom management. Patient/health care surrogate gave his/her permission to discuss.Our advance care planning conversation included a discussion about:    The value and importance of advance care planning  Experiences with loved ones who have been seriously ill or have died  Exploration of personal, cultural or spiritual beliefs that might influence medical decisions  Exploration of goals of care in the event of a sudden injury or illness  Identification  of a healthcare agent  Review and updating or creation of an  advance directive document . Decision not to resuscitate or to de-escalate disease focused treatments due to poor prognosis. CODE STATUS:  DNR, MOST on file  Symptom Management/Plan: 1. Chronic diastolic heart failure (HCC) -does not appear to have progressed further from this perspective though memory has further declined -cont same  regimen and monitor  2. Memory loss -clearly worse and appears he is still functioning primarily on his own at home--still driving around [place] some and very defensive about it, going shopping on his own and somehow he's not on meals on wheels anymore though he notes it was really helpful for him -sounds like his ex-wife has been helping him out though he's not entirely happy with all of her attempts -family had initially planned to get him some help in the home but appears that this did not happen (? Refused by pt)  3. History of stroke -likely vascular etiology to his memory loss, has some dementia impacting function at this point  4. Primary osteoarthritis of both knees -gets knee injections and use tylenol, may use topicals also  5. Palliative care encounter -recommend restarting meals on wheels for him as he appears to have continued to lose weight since his wife's death and living on his own -due to changes in palliative program admission criteria, we will no longer be able to follow him    This visit was coded based on medical decision making (MDM).  PPS: weak 50%--I'm concerned about his driving and med mgt and wt loss at this point with his short-term memory loss  HOSPICE ELIGIBILITY/DIAGNOSIS: not current  Chief Complaint: Follow-up palliative visit  HISTORY OF PRESENT ILLNESS:  Maurice Hall is a 88 y.o. year old male  with h/o stroke, memory loss, unsteady gait uses  cane, weight loss, grief about loss of his wife in sept, and chronic diastolic chf seen in palliative f/u at home--no one was there with him.    Back remains quite painful.  It's a while until his next visit for this and says it didn't help much anyway.    No falls.  He uses his cane.  He admits to his balance problem.    He admits his memory is poor.  He has great long-term memory from his past, but nothing recent.  Admits in 30 mins he will forget my name.    Reports no problem taking his meds.  He is  still driving a little bit.  Not at night and no long distances.  He is very careful.    For a while, he was getting meals on wheels.  He is no longer getting that.  It was very helpful and kept him from having to go out.    His weight is down to 193 lbs from 218.4 lbs in June.  He sleeps late a little bit, so sometimes just two meals happen.  He doesn't do a lot of snacking like when he was young.  His appetite is not that great anymore.  His playing weight in football was 245 lbs.   He sleeps well.  He'll have the tv on and nod off.  His ex-wife comes to check on him and he reports she's helpful.    He misses his second wife so much--they were married 21 yrs.  He's not adjusted to her being gone.  His children live in [place], on the [place] is here locally.    History obtained from review of EMR, discussion with primary team, and interview with family, facility staff/caregiver and/or Mr. Chandran.   I reviewed available labs, medications, imaging, studies and related documents from the EMR.  Records reviewed and summarized above.   ROS Review of Systemssee hpi  Physical Exam: Vitals:   06/15/22 1337  BP: 116/68  Pulse: 63  Temp: 97.9 F (36.6 C)  SpO2: 93%  Weight: 193 lb (87.5 kg)   Body mass index is 24.78 kg/m. Wt Readings from Last 500 Encounters:  06/15/22 193 lb (87.5 kg)  01/16/22 218 lb 6.4 oz (99.1 kg)  07/11/21 222 lb (100.7 kg)  05/12/21 230 lb 3.2 oz (104.4 kg)  04/13/21 219 lb 9.6 oz (99.6 kg)  03/04/21 222 lb 3.2 oz (100.8 kg)  11/10/20 218 lb (98.9 kg)  04/28/20 218 lb (98.9 kg)  12/08/19 220 lb (99.8 kg)  12/03/19 218 lb (98.9 kg)  05/27/19 214 lb (97.1 kg)  04/03/19 217 lb 3.2 oz (98.5 kg)  07/15/18 232 lb (105.2 kg)  01/16/18 222 lb (100.7 kg)  07/18/17 231 lb (104.8 kg)  06/23/16 232 lb (105.2 kg)  08/24/15 235 lb 3.2 oz (106.7 kg)  05/21/15 228 lb (103.4 kg)  07/14/14 233 lb 12.8 oz (106.1 kg)  07/08/13 234 lb (106.1 kg)  03/11/13 230 lb (104.3  kg)  02/25/13 228 lb 9.6 oz (103.7 kg)  06/04/12 227 lb (103 kg)  12/11/11 225 lb (102.1 kg)  12/04/11 225 lb (102.1 kg)  06/06/11 228 lb 12.8 oz (103.8 kg)   Physical Exam Constitutional:      Comments: Dyspneic upon reaching door, pale, disheveled (some food on clothes, a bit of odor of stool)  Cardiovascular:     Rate and Rhythm: Rhythm irregular.  Pulmonary:     Breath sounds: No rales.  Comments: Dyspneic on exertion Abdominal:     General: Bowel sounds are normal.  Musculoskeletal:        General: Normal range of motion.     Comments: Ambulates with cane  Skin:    General: Skin is warm and dry.     Coloration: Skin is pale.  Neurological:     General: No focal deficit present.     Mental Status: He is alert.     Gait: Gait abnormal.     Comments: Unsteady gait  Psychiatric:     Comments: Flat affect     CURRENT PROBLEM LIST:  Patient Active Problem List   Diagnosis Date Noted   Aortic atherosclerosis ([place]) 10/13/2021   Chronic diastolic heart failure ([place]) 10/13/2021   Congestive heart failure ([place]) 07/18/2019   Cerebrovascular accident ([place]) 07/18/2019   Hypertensive disorder 07/18/2019   Dyspnea 07/14/2014   Atrial fibrillation ([place]) 07/08/2013   OA (osteoarthritis) of knee 12/11/2011   Dyslipidemia 06/02/2009   ESSENTIAL HYPERTENSION, BENIGN 06/02/2009   SICK SINUS SYNDROME 06/02/2009   PPM-Medtronic 06/02/2009    PAST MEDICAL HISTORY:  Active Ambulatory Problems    Diagnosis Date Noted   Dyslipidemia 06/02/2009   ESSENTIAL HYPERTENSION, BENIGN 06/02/2009   SICK SINUS SYNDROME 06/02/2009   PPM-Medtronic 06/02/2009   OA (osteoarthritis) of knee 12/11/2011   Atrial fibrillation ([place]) 07/08/2013   Dyspnea 07/14/2014   Congestive heart failure ([place]) 07/18/2019   Cerebrovascular accident ([place]) 07/18/2019   Hypertensive disorder 07/18/2019   Aortic atherosclerosis ([place]) 10/13/2021   Chronic diastolic heart failure ([place]) 10/13/2021   Resolved  Ambulatory Problems    Diagnosis Date Noted   No Resolved Ambulatory Problems   Past Medical History:  Diagnosis Date   Arthritis    GERD (gastroesophageal reflux disease)    HTN (hypertension)    Paroxysmal atrial fibrillation (HCC)    Symptomatic bradycardia     SOCIAL HX:  Social History   Tobacco Use   Smoking status: Former    Packs/day: 3.00    Years: 15.00    Total pack years: 45.00    Types: Cigarettes    Start date: 59    Quit date: 12/04/1963    Years since quitting: 58.5   Smokeless tobacco: Never  Substance Use Topics   Alcohol use: No     ALLERGIES: No Known Allergies    PERTINENT MEDICATIONS:  Outpatient Encounter Medications as of 06/15/2022  Medication Sig   acetaminophen (TYLENOL) 500 MG tablet Take 1,500 mg by mouth daily.    donepezil (ARICEPT) 10 MG tablet Take 10 mg by mouth daily.    dorzolamide-timolol (COSOPT) 22.3-6.8 MG/ML ophthalmic solution Place 1 drop into the right eye 2 (two) times daily.   Fluticasone-Umeclidin-Vilant (TRELEGY ELLIPTA) 100-62.5-25 MCG/ACT AEPB INHALE 1 PUFF INTO THE LUNGS ONCE DAILY   folic acid (FOLVITE) 1 MG tablet Take 1 mg by mouth daily.    furosemide (LASIX) 40 MG tablet Take 1 tablet (40 mg total) by mouth daily.   ipratropium (ATROVENT) 0.06 % nasal spray Place 2 sprays into both nostrils 4 (four) times daily.   losartan (COZAAR) 100 MG tablet Take 100 mg by mouth daily as needed (High blood pressure).    Melatonin 10 MG TABS Take 10 mg by mouth at bedtime.    methotrexate 50 MG/2ML injection Inject 20 mg into the skin every Thursday. 0.8 ml in stomach   Misc Natural Products (OSTEO BI-FLEX ADV JOINT SHIELD PO) Take 3 tablets by mouth daily.  Omega-3 Fatty Acids (FISH OIL) 1000 MG CAPS Take 3,000 mg by mouth daily.    promethazine-codeine (PHENERGAN WITH CODEINE) 6.25-10 MG/5ML syrup Take 5 mLs by mouth every 6 (six) hours as needed for cough. (Patient not taking: Reported on 01/16/2022)   simvastatin (ZOCOR)  20 MG tablet Take 20 mg by mouth at bedtime.   tamsulosin (FLOMAX) 0.4 MG CAPS capsule Take 0.4 mg by mouth daily.    trazodone (DESYREL) 300 MG tablet Take 150 mg by mouth at bedtime.    XARELTO 20 MG TABS tablet TAKE 1 TABLET EVERY DAY   No facility-administered encounter medications on file as of 06/15/2022.    Thank you for the opportunity to participate in the care of Mr. Hippe.  The palliative care team will continue to follow. Please call our office at [phone] if we can be of additional assistance.   Hollace Kinnier, DO  COVID-19 PATIENT SCREENING TOOL Asked and negative response unless otherwise noted:  Have you had symptoms of covid, tested positive or been in contact with someone with symptoms/positive test in the past 5-10 days? no

## 2022-06-16 DIAGNOSIS — I482 Chronic atrial fibrillation, unspecified: Secondary | ICD-10-CM | POA: Diagnosis not present

## 2022-06-16 DIAGNOSIS — Z139 Encounter for screening, unspecified: Secondary | ICD-10-CM | POA: Diagnosis not present

## 2022-06-16 DIAGNOSIS — Z6826 Body mass index (BMI) 26.0-26.9, adult: Secondary | ICD-10-CM | POA: Diagnosis not present

## 2022-06-16 DIAGNOSIS — L4050 Arthropathic psoriasis, unspecified: Secondary | ICD-10-CM | POA: Diagnosis not present

## 2022-06-16 DIAGNOSIS — I5030 Unspecified diastolic (congestive) heart failure: Secondary | ICD-10-CM | POA: Diagnosis not present

## 2022-06-16 DIAGNOSIS — Z23 Encounter for immunization: Secondary | ICD-10-CM | POA: Diagnosis not present

## 2022-06-16 DIAGNOSIS — R52 Pain, unspecified: Secondary | ICD-10-CM | POA: Diagnosis not present

## 2022-06-21 ENCOUNTER — Other Ambulatory Visit: Payer: Self-pay | Admitting: Pulmonary Disease

## 2022-06-21 NOTE — Telephone Encounter (Signed)
Last refill pt will need apt for any future

## 2022-07-07 DIAGNOSIS — E782 Mixed hyperlipidemia: Secondary | ICD-10-CM | POA: Diagnosis not present

## 2022-07-07 DIAGNOSIS — L4050 Arthropathic psoriasis, unspecified: Secondary | ICD-10-CM | POA: Diagnosis not present

## 2022-07-20 ENCOUNTER — Encounter: Payer: Self-pay | Admitting: Internal Medicine

## 2022-07-20 ENCOUNTER — Ambulatory Visit: Payer: Medicare PPO | Attending: Internal Medicine | Admitting: Internal Medicine

## 2022-07-20 VITALS — BP 130/80 | HR 86 | Ht 74.0 in | Wt 202.4 lb

## 2022-07-20 DIAGNOSIS — I4821 Permanent atrial fibrillation: Secondary | ICD-10-CM | POA: Diagnosis not present

## 2022-07-20 NOTE — Progress Notes (Signed)
HPI Mr. Rinn returns today for follouwp. He is a pleasant 88 yo man with a h/o sinus node dysfunction s/p PPM insertion who developed permanent atrial fib, diastolic heart failure, and HTN. He has been saddened by the loss of his wife of 46 years. He is limited by arthritis in his spine and his activity is much less. He denies chest pain. He has class 2 dyspnea.  No Known Allergies   Current Outpatient Medications  Medication Sig Dispense Refill   acetaminophen (TYLENOL) 500 MG tablet Take 1,500 mg by mouth daily.      donepezil (ARICEPT) 10 MG tablet Take 10 mg by mouth daily.      dorzolamide-timolol (COSOPT) 22.3-6.8 MG/ML ophthalmic solution Place 1 drop into the right eye 2 (two) times daily.     folic acid (FOLVITE) 1 MG tablet Take 1 mg by mouth daily.      furosemide (LASIX) 40 MG tablet Take 1 tablet (40 mg total) by mouth daily. 90 tablet 3   ipratropium (ATROVENT) 0.06 % nasal spray Place 2 sprays into both nostrils 4 (four) times daily. 15 mL 6   losartan (COZAAR) 100 MG tablet Take 100 mg by mouth daily as needed (High blood pressure).      Melatonin 10 MG TABS Take 10 mg by mouth at bedtime.      memantine (NAMENDA) 5 MG tablet Take 5 mg by mouth 2 (two) times daily.     methotrexate 50 MG/2ML injection Inject 20 mg into the skin every Thursday. 0.8 ml in stomach     metolazone (ZAROXOLYN) 2.5 MG tablet Take 2.5 mg by mouth daily.     metoprolol tartrate (LOPRESSOR) 25 MG tablet Take 12.5 mg by mouth 2 (two) times daily.     Misc Natural Products (OSTEO BI-FLEX ADV JOINT SHIELD PO) Take 3 tablets by mouth daily.     Omega-3 Fatty Acids (FISH OIL) 1000 MG CAPS Take 3,000 mg by mouth daily.      potassium chloride (MICRO-K) 10 MEQ CR capsule Take 10 mEq by mouth daily.     promethazine-codeine (PHENERGAN WITH CODEINE) 6.25-10 MG/5ML syrup Take 5 mLs by mouth every 6 (six) hours as needed for cough. 120 mL 0   simvastatin (ZOCOR) 20 MG tablet Take 20 mg by mouth at  bedtime.     tamsulosin (FLOMAX) 0.4 MG CAPS capsule Take 0.4 mg by mouth daily.      trazodone (DESYREL) 300 MG tablet Take 150 mg by mouth at bedtime.      TRELEGY ELLIPTA 100-62.5-25 MCG/ACT AEPB INHALE 1 PUFF INTO THE LUNGS ONCE DAILY 180 each 10   XARELTO 20 MG TABS tablet TAKE 1 TABLET EVERY DAY 90 tablet 1   No current facility-administered medications for this visit.     Past Medical History:  Diagnosis Date   Arthritis    Dyslipidemia    GERD (gastroesophageal reflux disease)    HTN (hypertension)    Paroxysmal atrial fibrillation (HCC)    Symptomatic bradycardia    a. s/p MDT dual chamber PPM    ROS:   All systems reviewed and negative except as noted in the HPI.   Past Surgical History:  Procedure Laterality Date   APPENDECTOMY  1946   EP IMPLANTABLE DEVICE N/A 05/21/2015   Procedure:  PPM Generator Changeout;  Surgeon: Marinus Maw, MD;  [facility] CV [facility];  Service: Cardiovascular;  Laterality: N/A;   EYE SURGERY  02/01/2007,09/02/2007  left eye, right eye   right knee cartilage removed  1975,1999   right shoulder  08/26/1990   arthroscopy   RIGHT/LEFT HEART CATH AND CORONARY ANGIOGRAPHY N/A 12/08/2019   Procedure: RIGHT/LEFT HEART CATH AND CORONARY ANGIOGRAPHY;  Surgeon: Yvonne Kendall, MD;  Location: [facility] INVASIVE CV LAB;  Service: Cardiovascular;  Laterality: N/A;   S/P PPM (Medtronic Sigma DDD  12/03   STERIOD INJECTION  12/11/2011   Procedure: STEROID INJECTION;  Surgeon: Loanne Drilling, MD;  Location: [facility] ORS;  Service: Orthopedics;  Laterality: Left;   TONSILLECTOMY     age 4   TOTAL KNEE ARTHROPLASTY  12/11/2011   Procedure: TOTAL KNEE ARTHROPLASTY;  Surgeon: Loanne Drilling, MD;  Location: [facility] ORS;  Service: Orthopedics;  Laterality: Right;   VASECTOMY  1968   vocal cord growth   02/1999   removed growth on vocal cords     Family History  Problem Relation Age of Onset   Coronary artery disease Neg Hx      Social History    Socioeconomic History   Marital status: Married    Spouse name: Not on file   Number of children: Not on file   Years of education: Not on file   Highest education level: Not on file  Occupational History   Not on file  Tobacco Use   Smoking status: Former    Packs/day: 3.00    Years: 15.00    Total pack years: 45.00    Types: Cigarettes    Start date: 38    Quit date: 12/04/1963    Years since quitting: 58.6   Smokeless tobacco: Never  Vaping Use   Vaping Use: Never used  Substance and Sexual Activity   Alcohol use: No   Drug use: No    Comment: Rare ETOH    Sexual activity: Not on file  Other Topics Concern   Not on file  Social History Narrative   Not on file   Social Determinants of Health   Financial Resource Strain: Not on file  Food Insecurity: Not on file  Transportation Needs: Not on file  Physical Activity: Not on file  Stress: Not on file  Social Connections: Not on file  Intimate Partner Violence: Not on file     BP 130/80   Pulse 86   Ht 6\' 2"  (1.88 m)   Wt 202 lb 6.4 oz (91.8 kg)   SpO2 97%   BMI 25.99 kg/m   Physical Exam:  Well appearing NAD HEENT: Unremarkable Neck:  No JVD, no thyromegally Lymphatics:  No adenopathy Back:  No CVA tenderness Lungs:  Clear with no wheezes HEART:  Regular rate rhythm, no murmurs, no rubs, no clicks Abd:  soft, positive bowel sounds, no organomegally, no rebound, no guarding Ext:  2 plus pulses, no edema, no cyanosis, no clubbing; he has noticeable atrophy Skin:  No rashes no nodules Neuro:  CN II through XII intact, motor grossly intact  DEVICE  Normal device function.  See PaceArt for details.   Assess/Plan: Sinus node dysfunction - he is asymptomatic s/p PPM insertion Perm atrial fib - his rates are very well controlled PPM -his medtronic DDD PM is working normally.  HTN - his bp is controlled. Continue current meds.  Jermaine Tholl,MD

## 2022-07-20 NOTE — Patient Instructions (Signed)
Medication Instructions:  NO CHANGES *If you need a refill on your cardiac medications before your next appointment, please call your pharmacy*   Lab Work: NONE If you have labs (blood work) drawn today and your tests are completely normal, you will receive your results only by: MyChart Message (if you have MyChart) OR A paper copy in the mail If you have any lab test that is abnormal or we need to change your treatment, we will call you to review the results.   Testing/Procedures: NONE   Follow-Up: At [facility], you and your health needs are our priority.  As part of our continuing mission to provide you with exceptional heart care, we have created designated Provider Care Teams.  These Care Teams include your primary Cardiologist (physician) and Advanced Practice Providers (APPs -  Physician Assistants and Nurse Practitioners) who all work together to provide you with the care you need, when you need it.  We recommend signing up for the patient portal called "MyChart".  Sign up information is provided on this After Visit Summary.  MyChart is used to connect with patients for Virtual Visits (Telemedicine).  Patients are able to view lab/test results, encounter notes, upcoming appointments, etc.  Non-urgent messages can be sent to your provider as well.   To learn more about what you can do with MyChart, go to [email or web address].    Your next appointment:   1 year(s)  The format for your next appointment:   In Person  Provider: DR Elberta Fortis [place] [facility]    Other Instructions NONE portant Information About [place]

## 2022-07-24 LAB — CUP PACEART INCLINIC DEVICE CHECK
Battery Remaining Longevity: 21 mo
Battery Voltage: 2.92 V
Brady Statistic AP VP Percent: 12.06 %
Brady Statistic AP VS Percent: 3.39 %
Brady Statistic AS VP Percent: 40.01 %
Brady Statistic AS VS Percent: 44.54 %
Brady Statistic RA Percent Paced: 11.19 %
Brady Statistic RV Percent Paced: 49.92 %
Date Time Interrogation Session: 20231214164800
Implantable Lead Connection Status: 753985
Implantable Lead Connection Status: 753985
Implantable Lead Implant Date: 20031201
Implantable Lead Implant Date: 20031201
Implantable Lead Location: 753859
Implantable Lead Location: 753860
Implantable Lead Model: 5076
Implantable Lead Model: 5076
Implantable Pulse Generator Implant Date: 20161014
Lead Channel Impedance Value: 323 Ohm
Lead Channel Impedance Value: 380 Ohm
Lead Channel Impedance Value: 418 Ohm
Lead Channel Impedance Value: 475 Ohm
Lead Channel Pacing Threshold Amplitude: 0.75 V
Lead Channel Pacing Threshold Amplitude: 0.75 V
Lead Channel Pacing Threshold Amplitude: 0.875 V
Lead Channel Pacing Threshold Amplitude: 1.5 V
Lead Channel Pacing Threshold Pulse Width: 0.4 ms
Lead Channel Pacing Threshold Pulse Width: 0.4 ms
Lead Channel Pacing Threshold Pulse Width: 0.4 ms
Lead Channel Pacing Threshold Pulse Width: 0.4 ms
Lead Channel Sensing Intrinsic Amplitude: 0.375 mV
Lead Channel Sensing Intrinsic Amplitude: 0.375 mV
Lead Channel Sensing Intrinsic Amplitude: 12 mV
Lead Channel Sensing Intrinsic Amplitude: 12.5 mV
Lead Channel Setting Pacing Amplitude: 2.5 V
Lead Channel Setting Pacing Amplitude: 3 V
Lead Channel Setting Pacing Pulse Width: 0.4 ms
Lead Channel Setting Sensing Sensitivity: 2.8 mV
Zone Setting Status: 755011
Zone Setting Status: 755011

## 2022-08-07 DIAGNOSIS — L4050 Arthropathic psoriasis, unspecified: Secondary | ICD-10-CM | POA: Diagnosis not present

## 2022-08-07 DIAGNOSIS — E782 Mixed hyperlipidemia: Secondary | ICD-10-CM | POA: Diagnosis not present

## 2022-08-11 DIAGNOSIS — Z139 Encounter for screening, unspecified: Secondary | ICD-10-CM | POA: Diagnosis not present

## 2022-08-11 DIAGNOSIS — Z1331 Encounter for screening for depression: Secondary | ICD-10-CM | POA: Diagnosis not present

## 2022-08-11 DIAGNOSIS — J312 Chronic pharyngitis: Secondary | ICD-10-CM | POA: Diagnosis not present

## 2022-08-11 DIAGNOSIS — Z20822 Contact with and (suspected) exposure to covid-19: Secondary | ICD-10-CM | POA: Diagnosis not present

## 2022-08-11 DIAGNOSIS — G8929 Other chronic pain: Secondary | ICD-10-CM | POA: Diagnosis not present

## 2022-08-11 DIAGNOSIS — I10 Essential (primary) hypertension: Secondary | ICD-10-CM | POA: Diagnosis not present

## 2022-08-11 DIAGNOSIS — J029 Acute pharyngitis, unspecified: Secondary | ICD-10-CM | POA: Diagnosis not present

## 2022-08-11 DIAGNOSIS — M549 Dorsalgia, unspecified: Secondary | ICD-10-CM | POA: Diagnosis not present

## 2022-08-11 DIAGNOSIS — Z6828 Body mass index (BMI) 28.0-28.9, adult: Secondary | ICD-10-CM | POA: Diagnosis not present

## 2022-09-03 ENCOUNTER — Other Ambulatory Visit: Payer: Self-pay | Admitting: Internal Medicine

## 2022-09-07 DIAGNOSIS — E782 Mixed hyperlipidemia: Secondary | ICD-10-CM | POA: Diagnosis not present

## 2022-09-07 DIAGNOSIS — L4050 Arthropathic psoriasis, unspecified: Secondary | ICD-10-CM | POA: Diagnosis not present

## 2022-09-15 DIAGNOSIS — L4050 Arthropathic psoriasis, unspecified: Secondary | ICD-10-CM | POA: Diagnosis not present

## 2022-09-15 DIAGNOSIS — Z79899 Other long term (current) drug therapy: Secondary | ICD-10-CM | POA: Diagnosis not present

## 2022-09-20 ENCOUNTER — Other Ambulatory Visit: Payer: Self-pay | Admitting: Internal Medicine

## 2022-09-20 DIAGNOSIS — I480 Paroxysmal atrial fibrillation: Secondary | ICD-10-CM

## 2022-09-20 NOTE — Telephone Encounter (Signed)
Prescription refill request for Xarelto received.  Indication: Afib  Last office visit: 07/20/22 Lovena Le)  Weight: 91.8kg Age: 89 Scr: 1.39 (03/08/22 via [facility])  CrCl: 46.52m/min

## 2022-09-20 NOTE — Telephone Encounter (Signed)
Per dosing criteria, pt's current dose is not appropriate.   Per Tommy Medal: SCr was a big jump from his baseline a few months prior. Give him 30 days at 20 mg and get repeat BMET to see where is is now. That one was 5 months ago, and at his age, it's hard to say where it might be today  Called and spoke with pt. Made him aware of Xarelto doing criteria and the need to updated labs for future refills. Lab orders placed. Pt verbalized understanding.   Made him aware that we would send 30 day supply until labs have resulted; however, pt states he gets his Xarelto from [facility]. Refill sent to pharmacy with no refills and will re-evaluate appropriate dose at next refill.

## 2022-09-21 DIAGNOSIS — I480 Paroxysmal atrial fibrillation: Secondary | ICD-10-CM | POA: Diagnosis not present

## 2022-09-22 LAB — CBC
Hematocrit: 45.1 % (ref 37.5–51.0)
Hemoglobin: 15.4 g/dL (ref 13.0–17.7)
MCH: 33 pg (ref 26.6–33.0)
MCHC: 34.1 g/dL (ref 31.5–35.7)
MCV: 97 fL (ref 79–97)
Platelets: 194 10*3/uL (ref 150–450)
RBC: 4.67 x10E6/uL (ref 4.14–5.80)
RDW: 13.1 % (ref 11.6–15.4)
WBC: 7.6 10*3/uL (ref 3.4–10.8)

## 2022-09-22 LAB — BASIC METABOLIC PANEL
BUN/Creatinine Ratio: 17 (ref 10–24)
BUN: 22 mg/dL (ref 8–27)
CO2: 21 mmol/L (ref 20–29)
Calcium: 9.5 mg/dL (ref 8.6–10.2)
Chloride: 104 mmol/L (ref 96–106)
Creatinine, Ser: 1.27 mg/dL (ref 0.76–1.27)
Glucose: 117 mg/dL — ABNORMAL HIGH (ref 70–99)
Potassium: 3.9 mmol/L (ref 3.5–5.2)
Sodium: 142 mmol/L (ref 134–144)
eGFR: 54 mL/min/{1.73_m2} — ABNORMAL LOW (ref >59)

## 2022-09-25 ENCOUNTER — Telehealth: Payer: Self-pay

## 2022-09-25 NOTE — Telephone Encounter (Signed)
-----   Message from Evans Lance, MD sent at 09/22/2022  4:42 PM EST ----- No change. Labs are ok.

## 2022-10-06 DIAGNOSIS — E782 Mixed hyperlipidemia: Secondary | ICD-10-CM | POA: Diagnosis not present

## 2022-10-06 DIAGNOSIS — L4050 Arthropathic psoriasis, unspecified: Secondary | ICD-10-CM | POA: Diagnosis not present

## 2022-10-16 ENCOUNTER — Telehealth: Payer: Self-pay | Admitting: *Deleted

## 2022-10-16 NOTE — Telephone Encounter (Signed)
Pharmacy please advise on holding Xarelto prior to lumbar ESI procedure scheduled for TBD. Thank you.

## 2022-10-16 NOTE — Telephone Encounter (Signed)
   Pre-operative Risk Assessment    Patient Name: Maurice Hall  DOB: Dec 31, 1930 MRN: [id number]      Request for Surgical Clearance    Procedure:   LUMBAR SPINE - LS-S1 ESI  Date of Surgery:  Clearance TBD                                 Surgeon:  DR. DAVE [facility] Surgeon's Group or Practice Name:  [facility] Phone number:  [phone] Fax number:  [phone]  4. What type of clearance is requested?  Medical or Cardiac Clearance only?  Pharmacy Clearance Only (Request is to hold medication only)?  Or Both?  Press F2 and select the clearance requested.  If both are needed, select both from the drop down list.     :1}  Type of Clearance Requested:   - Pharmacy:  Hold Rivaroxaban (Xarelto) X'S 3 DAYS   Type of Anesthesia:  Not Indicated   Additional requests/questions:    Signed, Jeanann Lewandowsky   10/16/2022, 1:54 PM

## 2022-10-16 NOTE — Telephone Encounter (Signed)
   Patient Name: Maurice Hall  DOB: 01-10-1931 MRN: [id number]  Primary Cardiologist: Cristopher Peru, MD  Clinical pharmacists have reviewed the patient's past medical history, labs, and current medications as part of preoperative protocol coverage. The following recommendations have been made:   Per office protocol, patient can hold Xarelto for 3 days prior to procedure.   Patient will not need bridging with Lovenox (enoxaparin) around procedure.   I will route this recommendation to the requesting party via Epic fax function and remove from pre-op pool.  Please call with questions.  Mable Fill, Marissa Nestle, NP 10/16/2022, 3:16 PM

## 2022-10-16 NOTE — Telephone Encounter (Signed)
Patient with diagnosis of atrial fibrillation on xarelto for anticoagulation.    Procedure:   LUMBAR SPINE - LS-S1 ESI   Date of Surgery:  Clearance TBD   CHA2DS2-VASc Score = 7   This indicates a 11.2% annual risk of stroke. The patient's score is based upon: CHF History: 1 HTN History: 1 Diabetes History: 0 Stroke History: 2 Vascular Disease History: 1 Age Score: 2 Gender Score: 0   CVA listed under external problems that weren't reconciled, this was added 07/18/19 by [facility] but cannot find any further details and it's not mentioned in any cardiology notes.   CrCl 51 Platelet count 194  Per office protocol, patient can hold Xarelto for 3 days prior to procedure.   Patient will not need bridging with Lovenox (enoxaparin) around procedure.  **This guidance is not considered finalized until pre-operative APP has relayed final recommendations.**

## 2022-10-27 DIAGNOSIS — M5416 Radiculopathy, lumbar region: Secondary | ICD-10-CM | POA: Diagnosis not present

## 2022-11-06 DIAGNOSIS — L4050 Arthropathic psoriasis, unspecified: Secondary | ICD-10-CM | POA: Diagnosis not present

## 2022-11-06 DIAGNOSIS — E782 Mixed hyperlipidemia: Secondary | ICD-10-CM | POA: Diagnosis not present

## 2022-12-02 ENCOUNTER — Other Ambulatory Visit: Payer: Self-pay | Admitting: Internal Medicine

## 2022-12-02 DIAGNOSIS — I480 Paroxysmal atrial fibrillation: Secondary | ICD-10-CM

## 2022-12-04 NOTE — Telephone Encounter (Signed)
Prescription refill request for Xarelto received.  Indication: a fib Last office visit: 07/20/22 Weight: 91 kg Age: 89 Scr: 1.27 CrCl: 51 mL/min

## 2022-12-05 DIAGNOSIS — H5789 Other specified disorders of eye and adnexa: Secondary | ICD-10-CM | POA: Diagnosis not present

## 2022-12-06 DIAGNOSIS — L4050 Arthropathic psoriasis, unspecified: Secondary | ICD-10-CM | POA: Diagnosis not present

## 2022-12-06 DIAGNOSIS — E782 Mixed hyperlipidemia: Secondary | ICD-10-CM | POA: Diagnosis not present

## 2022-12-26 DIAGNOSIS — Z79899 Other long term (current) drug therapy: Secondary | ICD-10-CM | POA: Diagnosis not present

## 2022-12-26 DIAGNOSIS — M1990 Unspecified osteoarthritis, unspecified site: Secondary | ICD-10-CM | POA: Diagnosis not present

## 2022-12-26 DIAGNOSIS — M5136 Other intervertebral disc degeneration, lumbar region: Secondary | ICD-10-CM | POA: Diagnosis not present

## 2022-12-26 DIAGNOSIS — E785 Hyperlipidemia, unspecified: Secondary | ICD-10-CM | POA: Diagnosis not present

## 2022-12-26 DIAGNOSIS — L4050 Arthropathic psoriasis, unspecified: Secondary | ICD-10-CM | POA: Diagnosis not present

## 2022-12-26 DIAGNOSIS — I10 Essential (primary) hypertension: Secondary | ICD-10-CM | POA: Diagnosis not present

## 2023-01-06 DIAGNOSIS — E782 Mixed hyperlipidemia: Secondary | ICD-10-CM | POA: Diagnosis not present

## 2023-01-06 DIAGNOSIS — L4050 Arthropathic psoriasis, unspecified: Secondary | ICD-10-CM | POA: Diagnosis not present

## 2023-01-08 ENCOUNTER — Encounter: Payer: Self-pay | Admitting: Cardiology

## 2023-01-08 ENCOUNTER — Ambulatory Visit: Payer: Medicare PPO | Attending: Cardiology | Admitting: Cardiology

## 2023-01-08 VITALS — BP 122/62 | HR 74 | Ht 74.0 in | Wt 217.2 lb

## 2023-01-08 DIAGNOSIS — I4821 Permanent atrial fibrillation: Secondary | ICD-10-CM

## 2023-01-08 DIAGNOSIS — I495 Sick sinus syndrome: Secondary | ICD-10-CM | POA: Diagnosis not present

## 2023-01-08 DIAGNOSIS — D6869 Other thrombophilia: Secondary | ICD-10-CM | POA: Diagnosis not present

## 2023-01-08 LAB — CUP PACEART INCLINIC DEVICE CHECK
Battery Remaining Longevity: 14 mo
Battery Voltage: 2.9 V
Brady Statistic AP VP Percent: 13.31 %
Brady Statistic AP VS Percent: 1.08 %
Brady Statistic AS VP Percent: 67.95 %
Brady Statistic AS VS Percent: 17.66 %
Brady Statistic RA Percent Paced: 10.85 %
Brady Statistic RV Percent Paced: 81.14 %
Date Time Interrogation Session: 20240603161310
Implantable Lead Connection Status: 753985
Implantable Lead Connection Status: 753985
Implantable Lead Implant Date: 20031201
Implantable Lead Implant Date: 20031201
Implantable Lead Location: 753859
Implantable Lead Location: 753860
Implantable Lead Model: 5076
Implantable Lead Model: 5076
Implantable Pulse Generator Implant Date: 20161014
Lead Channel Impedance Value: 323 Ohm
Lead Channel Impedance Value: 380 Ohm
Lead Channel Impedance Value: 418 Ohm
Lead Channel Impedance Value: 456 Ohm
Lead Channel Pacing Threshold Amplitude: 0.875 V
Lead Channel Pacing Threshold Amplitude: 1.5 V
Lead Channel Pacing Threshold Pulse Width: 0.4 ms
Lead Channel Pacing Threshold Pulse Width: 0.4 ms
Lead Channel Sensing Intrinsic Amplitude: 0.25 mV
Lead Channel Sensing Intrinsic Amplitude: 0.375 mV
Lead Channel Sensing Intrinsic Amplitude: 10.75 mV
Lead Channel Sensing Intrinsic Amplitude: 12.75 mV
Lead Channel Setting Pacing Amplitude: 2.5 V
Lead Channel Setting Pacing Pulse Width: 0.4 ms
Lead Channel Setting Sensing Sensitivity: 2.8 mV
Zone Setting Status: 755011
Zone Setting Status: 755011

## 2023-01-08 NOTE — Patient Instructions (Signed)
Medication Instructions:  Your physician recommends that you continue on your current medications as directed. Please refer to the Current Medication list given to you today. *If you need a refill on your cardiac medications before your next appointment, please call your pharmacy*   Follow-Up: At [facility], you and your health needs are our priority.  As part of our continuing mission to provide you with exceptional heart care, we have created designated Provider Care Teams.  These Care Teams include your primary Cardiologist (physician) and Advanced Practice Providers (APPs -  Physician Assistants and Nurse Practitioners) who all work together to provide you with the care you need, when you need it.    Your next appointment:   1 year(s)  Provider:   Will Camnitz, MD  

## 2023-01-08 NOTE — Progress Notes (Signed)
Electrophysiology Office Note   Date:  01/08/2023   ID:  Maurice Hall, DOB 1930/12/15, MRN [id number]  PCP:  Charlott Rakes, MD  Cardiologist:  Bing Matter Primary Electrophysiologist:  Bahar Shelden Jorja Loa, MD    Chief Complaint: pacemaker   History of Present Illness: Maurice Hall is a 89 y.o. male who is being seen today for the evaluation of pacemaker at the request of Charlott Rakes, MD. Presenting today for electrophysiology evaluation.  He has a history of sinus node dysfunction post Medtronic pacemaker with subsequent development of permanent atrial fibrillation, diastolic heart failure, hypertension.    Today, he denies symptoms of palpitations, chest pain, shortness of breath, orthopnea, PND, lower extremity edema, claudication, dizziness, presyncope, syncope, bleeding, or neurologic sequela. The patient is tolerating medications without difficulties.    Past Medical History:  Diagnosis Date   Arthritis    Dyslipidemia    GERD (gastroesophageal reflux disease)    HTN (hypertension)    Paroxysmal atrial fibrillation (HCC)    Symptomatic bradycardia    a. s/p MDT dual chamber PPM   Past Surgical History:  Procedure Laterality Date   APPENDECTOMY  1946   EP IMPLANTABLE DEVICE N/A 05/21/2015   Procedure:  PPM Generator Changeout;  Surgeon: Marinus Maw, MD;  Location: [facility];  Service: Cardiovascular;  Laterality: N/A;   EYE SURGERY  02/01/2007,09/02/2007   left eye, right eye   right knee cartilage removed  1975,1999   right shoulder  08/26/1990   arthroscopy   RIGHT/LEFT HEART CATH AND CORONARY ANGIOGRAPHY N/A 12/08/2019   Procedure: RIGHT/LEFT HEART CATH AND CORONARY ANGIOGRAPHY;  Surgeon: Yvonne Kendall, MD;  Location: [facility];  Service: Cardiovascular;  Laterality: N/A;   S/P PPM (Medtronic Sigma DDD  12/03   STERIOD INJECTION  12/11/2011   Procedure: STEROID INJECTION;  Surgeon: Loanne Drilling, MD;  Location: [facility];  Service:  Orthopedics;  Laterality: Left;   TONSILLECTOMY     age 72   TOTAL KNEE ARTHROPLASTY  12/11/2011   Procedure: TOTAL KNEE ARTHROPLASTY;  Surgeon: Loanne Drilling, MD;  Location: [facility];  Service: Orthopedics;  Laterality: Right;   VASECTOMY  1968   vocal cord growth   02/1999   removed growth on vocal cords     Current Outpatient Medications  Medication Sig Dispense Refill   acetaminophen (TYLENOL) 500 MG tablet Take 1,500 mg by mouth daily.      donepezil (ARICEPT) 10 MG tablet Take 10 mg by mouth daily.      dorzolamide-timolol (COSOPT) 22.3-6.8 MG/ML ophthalmic solution Place 1 drop into the right eye 2 (two) times daily.     folic acid (FOLVITE) 1 MG tablet Take 1 mg by mouth daily.      furosemide (LASIX) 40 MG tablet TAKE 1 TABLET (40 MG TOTAL) BY MOUTH DAILY. 90 tablet 3   ipratropium (ATROVENT) 0.06 % nasal spray Place 2 sprays into both nostrils 4 (four) times daily. 15 mL 6   losartan (COZAAR) 100 MG tablet Take 100 mg by mouth daily as needed (High blood pressure).      Melatonin 10 MG TABS Take 10 mg by mouth at bedtime.      memantine (NAMENDA) 5 MG tablet Take 5 mg by mouth 2 (two) times daily.     methotrexate 50 MG/2ML injection Inject 20 mg into the skin every Thursday. 0.8 ml in stomach     metolazone (ZAROXOLYN) 2.5 MG tablet Take 2.5 mg by mouth  daily.     metoprolol tartrate (LOPRESSOR) 25 MG tablet Take 12.5 mg by mouth 2 (two) times daily.     Misc Natural Products (OSTEO BI-FLEX ADV JOINT SHIELD PO) Take 3 tablets by mouth daily.     Omega-3 Fatty Acids (FISH OIL) 1000 MG CAPS Take 3,000 mg by mouth daily.      potassium chloride (MICRO-K) 10 MEQ CR capsule Take 10 mEq by mouth daily.     promethazine-codeine (PHENERGAN WITH CODEINE) 6.25-10 MG/5ML syrup Take 5 mLs by mouth every 6 (six) hours as needed for cough. 120 mL 0   rivaroxaban (XARELTO) 20 MG TABS tablet TAKE 1 TABLET EVERY DAY 90 tablet 1   simvastatin (ZOCOR) 20 MG tablet Take 20 mg by mouth at bedtime.      tamsulosin (FLOMAX) 0.4 MG CAPS capsule Take 0.4 mg by mouth daily.      trazodone (DESYREL) 300 MG tablet Take 150 mg by mouth at bedtime.      TRELEGY ELLIPTA 100-62.5-25 MCG/ACT AEPB INHALE 1 PUFF INTO THE LUNGS ONCE DAILY 180 each 10   No current facility-administered medications for this visit.    Allergies:   Patient has no known allergies.   Social History:  The patient  reports that he quit smoking about 59 years ago. His smoking use included cigarettes. He started smoking about 74 years ago. He has a 45.00 pack-year smoking history. He has never used smokeless tobacco. He reports that he does not drink alcohol and does not use drugs.   Family History:  The patient's family history includes Cancer in his maternal grandfather, maternal uncle, and mother; Heart attack (age of onset: 46) in his father; Kidney disease in his mother.    ROS:  Please see the history of present illness.   Otherwise, review of systems is positive for none.   All other systems are reviewed and negative.    PHYSICAL EXAM: VS:  BP 122/62   Pulse 74   Ht 6\' 2"  (1.88 m)   Wt 217 lb 3.2 oz (98.5 kg)   SpO2 95%   BMI 27.89 kg/m  , BMI Body mass index is 27.89 kg/m. GEN: Well nourished, well developed, in no acute distress  HEENT: normal  Neck: no JVD, carotid bruits, or masses Cardiac: RRR; no murmurs, rubs, or gallops,no edema  Respiratory:  clear to auscultation bilaterally, normal work of breathing GI: soft, nontender, nondistended, + BS MS: no deformity or atrophy  Skin: warm and dry, device pocket is well healed Neuro:  Strength and sensation are intact Psych: euthymic mood, full affect  EKG:  EKG is ordered today. Personal review of the ekg ordered shows AF, V paced  Device interrogation is reviewed today in detail.  See PaceArt for details.   Recent Labs: 09/21/2022: BUN 22; Creatinine, Ser 1.27; Hemoglobin 15.4; Platelets 194; Potassium 3.9; Sodium 142    Lipid Panel  No results  found for: "CHOL", "TRIG", "HDL", "CHOLHDL", "VLDL", "LDLCALC", "LDLDIRECT"   Wt Readings from Last 3 Encounters:  01/08/23 217 lb 3.2 oz (98.5 kg)  07/20/22 202 lb 6.4 oz (91.8 kg)  06/15/22 193 lb (87.5 kg)      Other studies Reviewed: Additional studies/ records that were reviewed today include: TTE 03/01/22  Review of the above records today demonstrates:  Ejection fraction 50 to 55% Severely dilated left atrium Mild aortic sclerosis   ASSESSMENT AND PLAN:  1.  Sinus node dysfunction: Status post Medtronic pacemaker.  Device functioning appropriately.  As he is in permanent atrial fibrillation, we Breyton Vanscyoc set him to VVI 50 to try and avoid ventricular pacing.  2.  Permanent atrial fibrillation: Currently on metoprolol and Xarelto.  CHA2DS2-VASc of 3.  3.  Hypertension: well controlled  4.  Secondary hypercoagulable state: Currently on Xarelto for atrial fibrillation  Current medicines are reviewed at length with the patient today.   The patient does not have concerns regarding his medicines.  The following changes were made today:  none  Labs/ tests ordered today include:  Orders Placed This Encounter  Procedures   EKG 12-Lead     Disposition:   FU with Tarrin Lebow 1 year  Signed, Airen Stiehl Jorja Loa, MD  01/08/2023 3:42 PM     [facility] [address] [place] [address] [phone] (office) [phone] (fax)

## 2023-01-10 ENCOUNTER — Telehealth: Payer: Self-pay

## 2023-01-10 NOTE — Telephone Encounter (Signed)
Needs assistance reestablishing remote connection.

## 2023-01-11 NOTE — Telephone Encounter (Signed)
LMOVM for patient to call the device clinic. 

## 2023-01-24 NOTE — Telephone Encounter (Signed)
LMOVM for pt to give device clinic a call back.

## 2023-02-01 NOTE — Telephone Encounter (Signed)
Letter sent 02/01/2023

## 2023-02-05 DIAGNOSIS — E782 Mixed hyperlipidemia: Secondary | ICD-10-CM | POA: Diagnosis not present

## 2023-02-05 DIAGNOSIS — R7303 Prediabetes: Secondary | ICD-10-CM | POA: Diagnosis not present

## 2023-02-05 DIAGNOSIS — L4050 Arthropathic psoriasis, unspecified: Secondary | ICD-10-CM | POA: Diagnosis not present

## 2023-02-05 DIAGNOSIS — E785 Hyperlipidemia, unspecified: Secondary | ICD-10-CM | POA: Diagnosis not present

## 2023-02-14 DIAGNOSIS — F0150 Vascular dementia without behavioral disturbance: Secondary | ICD-10-CM | POA: Diagnosis not present

## 2023-02-14 DIAGNOSIS — Z1331 Encounter for screening for depression: Secondary | ICD-10-CM | POA: Diagnosis not present

## 2023-02-14 DIAGNOSIS — I5030 Unspecified diastolic (congestive) heart failure: Secondary | ICD-10-CM | POA: Diagnosis not present

## 2023-02-14 DIAGNOSIS — Z1339 Encounter for screening examination for other mental health and behavioral disorders: Secondary | ICD-10-CM | POA: Diagnosis not present

## 2023-02-14 DIAGNOSIS — R7303 Prediabetes: Secondary | ICD-10-CM | POA: Diagnosis not present

## 2023-02-14 DIAGNOSIS — I482 Chronic atrial fibrillation, unspecified: Secondary | ICD-10-CM | POA: Diagnosis not present

## 2023-02-14 DIAGNOSIS — Z136 Encounter for screening for cardiovascular disorders: Secondary | ICD-10-CM | POA: Diagnosis not present

## 2023-02-14 DIAGNOSIS — Z139 Encounter for screening, unspecified: Secondary | ICD-10-CM | POA: Diagnosis not present

## 2023-02-14 DIAGNOSIS — Z0000 Encounter for general adult medical examination without abnormal findings: Secondary | ICD-10-CM | POA: Diagnosis not present

## 2023-02-16 DIAGNOSIS — R32 Unspecified urinary incontinence: Secondary | ICD-10-CM | POA: Diagnosis not present

## 2023-02-16 DIAGNOSIS — D529 Folate deficiency anemia, unspecified: Secondary | ICD-10-CM | POA: Diagnosis not present

## 2023-02-16 DIAGNOSIS — Z7951 Long term (current) use of inhaled steroids: Secondary | ICD-10-CM | POA: Diagnosis not present

## 2023-02-16 DIAGNOSIS — Z9181 History of falling: Secondary | ICD-10-CM | POA: Diagnosis not present

## 2023-02-16 DIAGNOSIS — G629 Polyneuropathy, unspecified: Secondary | ICD-10-CM | POA: Diagnosis not present

## 2023-02-16 DIAGNOSIS — N400 Enlarged prostate without lower urinary tract symptoms: Secondary | ICD-10-CM | POA: Diagnosis not present

## 2023-02-16 DIAGNOSIS — F0150 Vascular dementia without behavioral disturbance: Secondary | ICD-10-CM | POA: Diagnosis not present

## 2023-02-16 DIAGNOSIS — L309 Dermatitis, unspecified: Secondary | ICD-10-CM | POA: Diagnosis not present

## 2023-02-16 DIAGNOSIS — G309 Alzheimer's disease, unspecified: Secondary | ICD-10-CM | POA: Diagnosis not present

## 2023-02-16 DIAGNOSIS — H409 Unspecified glaucoma: Secondary | ICD-10-CM | POA: Diagnosis not present

## 2023-02-16 DIAGNOSIS — K219 Gastro-esophageal reflux disease without esophagitis: Secondary | ICD-10-CM | POA: Diagnosis not present

## 2023-02-16 DIAGNOSIS — H9193 Unspecified hearing loss, bilateral: Secondary | ICD-10-CM | POA: Diagnosis not present

## 2023-02-16 DIAGNOSIS — Z7901 Long term (current) use of anticoagulants: Secondary | ICD-10-CM | POA: Diagnosis not present

## 2023-02-16 DIAGNOSIS — E785 Hyperlipidemia, unspecified: Secondary | ICD-10-CM | POA: Diagnosis not present

## 2023-02-16 DIAGNOSIS — I130 Hypertensive heart and chronic kidney disease with heart failure and stage 1 through stage 4 chronic kidney disease, or unspecified chronic kidney disease: Secondary | ICD-10-CM | POA: Diagnosis not present

## 2023-02-16 DIAGNOSIS — Z950 Presence of cardiac pacemaker: Secondary | ICD-10-CM | POA: Diagnosis not present

## 2023-02-16 DIAGNOSIS — L4050 Arthropathic psoriasis, unspecified: Secondary | ICD-10-CM | POA: Diagnosis not present

## 2023-02-16 DIAGNOSIS — F0280 Dementia in other diseases classified elsewhere without behavioral disturbance: Secondary | ICD-10-CM | POA: Diagnosis not present

## 2023-02-16 DIAGNOSIS — M1990 Unspecified osteoarthritis, unspecified site: Secondary | ICD-10-CM | POA: Diagnosis not present

## 2023-03-28 DIAGNOSIS — M25561 Pain in right knee: Secondary | ICD-10-CM | POA: Diagnosis not present

## 2023-03-28 DIAGNOSIS — L4050 Arthropathic psoriasis, unspecified: Secondary | ICD-10-CM | POA: Diagnosis not present

## 2023-03-28 DIAGNOSIS — E785 Hyperlipidemia, unspecified: Secondary | ICD-10-CM | POA: Diagnosis not present

## 2023-03-28 DIAGNOSIS — Z79899 Other long term (current) drug therapy: Secondary | ICD-10-CM | POA: Diagnosis not present

## 2023-03-28 DIAGNOSIS — I10 Essential (primary) hypertension: Secondary | ICD-10-CM | POA: Diagnosis not present

## 2023-03-28 DIAGNOSIS — M5136 Other intervertebral disc degeneration, lumbar region: Secondary | ICD-10-CM | POA: Diagnosis not present

## 2023-03-28 DIAGNOSIS — M1990 Unspecified osteoarthritis, unspecified site: Secondary | ICD-10-CM | POA: Diagnosis not present

## 2023-04-16 DIAGNOSIS — Z6829 Body mass index (BMI) 29.0-29.9, adult: Secondary | ICD-10-CM | POA: Diagnosis not present

## 2023-04-16 DIAGNOSIS — I4891 Unspecified atrial fibrillation: Secondary | ICD-10-CM | POA: Diagnosis not present

## 2023-04-16 DIAGNOSIS — Z23 Encounter for immunization: Secondary | ICD-10-CM | POA: Diagnosis not present

## 2023-04-16 DIAGNOSIS — J029 Acute pharyngitis, unspecified: Secondary | ICD-10-CM | POA: Diagnosis not present

## 2023-04-16 DIAGNOSIS — I5030 Unspecified diastolic (congestive) heart failure: Secondary | ICD-10-CM | POA: Diagnosis not present

## 2023-04-16 DIAGNOSIS — R051 Acute cough: Secondary | ICD-10-CM | POA: Diagnosis not present

## 2023-04-16 DIAGNOSIS — Z20822 Contact with and (suspected) exposure to covid-19: Secondary | ICD-10-CM | POA: Diagnosis not present

## 2023-04-18 DIAGNOSIS — J101 Influenza due to other identified influenza virus with other respiratory manifestations: Secondary | ICD-10-CM | POA: Diagnosis not present

## 2023-04-18 DIAGNOSIS — B338 Other specified viral diseases: Secondary | ICD-10-CM | POA: Diagnosis not present

## 2023-04-18 DIAGNOSIS — Z6829 Body mass index (BMI) 29.0-29.9, adult: Secondary | ICD-10-CM | POA: Diagnosis not present

## 2023-04-18 DIAGNOSIS — R059 Cough, unspecified: Secondary | ICD-10-CM | POA: Diagnosis not present

## 2023-04-18 DIAGNOSIS — Z20822 Contact with and (suspected) exposure to covid-19: Secondary | ICD-10-CM | POA: Diagnosis not present

## 2023-04-25 ENCOUNTER — Other Ambulatory Visit: Payer: Self-pay | Admitting: Internal Medicine

## 2023-04-25 DIAGNOSIS — I480 Paroxysmal atrial fibrillation: Secondary | ICD-10-CM

## 2023-04-25 NOTE — Telephone Encounter (Signed)
Xarelto 20mg  refill request received. Pt is 89 years old, weight-98.5kg, Crea-1.27 on 09/21/22, last seen by Dr. Elberta Fortis on 01/08/23, Diagnosis-Afib, CrCl-53.86 mL/min; Dose is appropriate based on dosing criteria. Will send in refill to requested pharmacy.

## 2023-05-08 DIAGNOSIS — I700 Atherosclerosis of aorta: Secondary | ICD-10-CM | POA: Diagnosis not present

## 2023-05-08 DIAGNOSIS — E782 Mixed hyperlipidemia: Secondary | ICD-10-CM | POA: Diagnosis not present

## 2023-06-07 DIAGNOSIS — H3411 Central retinal artery occlusion, right eye: Secondary | ICD-10-CM | POA: Diagnosis not present

## 2023-07-07 ENCOUNTER — Other Ambulatory Visit: Payer: Self-pay | Admitting: Internal Medicine

## 2023-07-07 ENCOUNTER — Other Ambulatory Visit: Payer: Self-pay | Admitting: Pulmonary Disease

## 2023-07-08 DIAGNOSIS — E782 Mixed hyperlipidemia: Secondary | ICD-10-CM | POA: Diagnosis not present

## 2023-07-08 DIAGNOSIS — I700 Atherosclerosis of aorta: Secondary | ICD-10-CM | POA: Diagnosis not present

## 2023-08-08 DIAGNOSIS — E782 Mixed hyperlipidemia: Secondary | ICD-10-CM | POA: Diagnosis not present

## 2023-08-08 DIAGNOSIS — I700 Atherosclerosis of aorta: Secondary | ICD-10-CM | POA: Diagnosis not present

## 2023-08-10 DIAGNOSIS — R7303 Prediabetes: Secondary | ICD-10-CM | POA: Diagnosis not present

## 2023-08-10 DIAGNOSIS — E785 Hyperlipidemia, unspecified: Secondary | ICD-10-CM | POA: Diagnosis not present

## 2023-08-15 DIAGNOSIS — Z6828 Body mass index (BMI) 28.0-28.9, adult: Secondary | ICD-10-CM | POA: Diagnosis not present

## 2023-08-15 DIAGNOSIS — N1831 Chronic kidney disease, stage 3a: Secondary | ICD-10-CM | POA: Diagnosis not present

## 2023-08-15 DIAGNOSIS — I10 Essential (primary) hypertension: Secondary | ICD-10-CM | POA: Diagnosis not present

## 2023-08-15 DIAGNOSIS — E785 Hyperlipidemia, unspecified: Secondary | ICD-10-CM | POA: Diagnosis not present

## 2023-08-15 DIAGNOSIS — R7303 Prediabetes: Secondary | ICD-10-CM | POA: Diagnosis not present

## 2023-08-28 DIAGNOSIS — M51369 Other intervertebral disc degeneration, lumbar region without mention of lumbar back pain or lower extremity pain: Secondary | ICD-10-CM | POA: Diagnosis not present

## 2023-08-28 DIAGNOSIS — E785 Hyperlipidemia, unspecified: Secondary | ICD-10-CM | POA: Diagnosis not present

## 2023-08-28 DIAGNOSIS — M1990 Unspecified osteoarthritis, unspecified site: Secondary | ICD-10-CM | POA: Diagnosis not present

## 2023-08-28 DIAGNOSIS — Z79899 Other long term (current) drug therapy: Secondary | ICD-10-CM | POA: Diagnosis not present

## 2023-08-28 DIAGNOSIS — M25561 Pain in right knee: Secondary | ICD-10-CM | POA: Diagnosis not present

## 2023-08-28 DIAGNOSIS — I10 Essential (primary) hypertension: Secondary | ICD-10-CM | POA: Diagnosis not present

## 2023-08-28 DIAGNOSIS — L4050 Arthropathic psoriasis, unspecified: Secondary | ICD-10-CM | POA: Diagnosis not present

## 2023-09-05 DIAGNOSIS — Z79899 Other long term (current) drug therapy: Secondary | ICD-10-CM | POA: Diagnosis not present

## 2023-10-06 DIAGNOSIS — I700 Atherosclerosis of aorta: Secondary | ICD-10-CM | POA: Diagnosis not present

## 2023-10-06 DIAGNOSIS — E782 Mixed hyperlipidemia: Secondary | ICD-10-CM | POA: Diagnosis not present

## 2023-11-06 DIAGNOSIS — I5030 Unspecified diastolic (congestive) heart failure: Secondary | ICD-10-CM | POA: Diagnosis not present

## 2023-11-06 DIAGNOSIS — R7303 Prediabetes: Secondary | ICD-10-CM | POA: Diagnosis not present

## 2023-12-06 DIAGNOSIS — H4051X4 Glaucoma secondary to other eye disorders, right eye, indeterminate stage: Secondary | ICD-10-CM | POA: Diagnosis not present

## 2023-12-06 DIAGNOSIS — H3411 Central retinal artery occlusion, right eye: Secondary | ICD-10-CM | POA: Diagnosis not present

## 2023-12-06 DIAGNOSIS — H52223 Regular astigmatism, bilateral: Secondary | ICD-10-CM | POA: Diagnosis not present

## 2023-12-18 DIAGNOSIS — I10 Essential (primary) hypertension: Secondary | ICD-10-CM | POA: Diagnosis not present

## 2023-12-18 DIAGNOSIS — M51369 Other intervertebral disc degeneration, lumbar region without mention of lumbar back pain or lower extremity pain: Secondary | ICD-10-CM | POA: Diagnosis not present

## 2023-12-18 DIAGNOSIS — M25561 Pain in right knee: Secondary | ICD-10-CM | POA: Diagnosis not present

## 2023-12-18 DIAGNOSIS — Z79899 Other long term (current) drug therapy: Secondary | ICD-10-CM | POA: Diagnosis not present

## 2023-12-18 DIAGNOSIS — L4050 Arthropathic psoriasis, unspecified: Secondary | ICD-10-CM | POA: Diagnosis not present

## 2023-12-18 DIAGNOSIS — E785 Hyperlipidemia, unspecified: Secondary | ICD-10-CM | POA: Diagnosis not present

## 2023-12-18 DIAGNOSIS — M1990 Unspecified osteoarthritis, unspecified site: Secondary | ICD-10-CM | POA: Diagnosis not present

## 2024-01-04 ENCOUNTER — Ambulatory Visit: Attending: Physician Assistant | Admitting: Physician Assistant

## 2024-01-04 ENCOUNTER — Encounter: Payer: Self-pay | Admitting: Physician Assistant

## 2024-01-04 VITALS — BP 146/82 | HR 74 | Ht 74.0 in | Wt 213.8 lb

## 2024-01-04 DIAGNOSIS — I4821 Permanent atrial fibrillation: Secondary | ICD-10-CM

## 2024-01-04 DIAGNOSIS — D6869 Other thrombophilia: Secondary | ICD-10-CM

## 2024-01-04 DIAGNOSIS — I5032 Chronic diastolic (congestive) heart failure: Secondary | ICD-10-CM

## 2024-01-04 DIAGNOSIS — I10 Essential (primary) hypertension: Secondary | ICD-10-CM

## 2024-01-04 DIAGNOSIS — Z950 Presence of cardiac pacemaker: Secondary | ICD-10-CM | POA: Diagnosis not present

## 2024-01-04 LAB — CUP PACEART INCLINIC DEVICE CHECK
Battery Remaining Longevity: 5 mo
Battery Voltage: 2.86 V
Brady Statistic RA Percent Paced: 0 %
Brady Statistic RV Percent Paced: 37.23 %
Date Time Interrogation Session: 20250530163731
Implantable Lead Connection Status: 753985
Implantable Lead Connection Status: 753985
Implantable Lead Implant Date: 20031201
Implantable Lead Implant Date: 20031201
Implantable Lead Location: 753859
Implantable Lead Location: 753860
Implantable Lead Model: 5076
Implantable Lead Model: 5076
Implantable Pulse Generator Implant Date: 20161014
Lead Channel Impedance Value: 323 Ohm
Lead Channel Impedance Value: 380 Ohm
Lead Channel Impedance Value: 399 Ohm
Lead Channel Impedance Value: 456 Ohm
Lead Channel Pacing Threshold Amplitude: 1 V
Lead Channel Pacing Threshold Amplitude: 1.5 V
Lead Channel Pacing Threshold Pulse Width: 0.4 ms
Lead Channel Pacing Threshold Pulse Width: 0.4 ms
Lead Channel Sensing Intrinsic Amplitude: 0.375 mV
Lead Channel Sensing Intrinsic Amplitude: 0.375 mV
Lead Channel Sensing Intrinsic Amplitude: 10.125 mV
Lead Channel Sensing Intrinsic Amplitude: 11.75 mV
Lead Channel Setting Pacing Amplitude: 2.5 V
Lead Channel Setting Pacing Pulse Width: 0.4 ms
Lead Channel Setting Sensing Sensitivity: 2.8 mV
Zone Setting Status: 755011
Zone Setting Status: 755011

## 2024-01-04 NOTE — Patient Instructions (Addendum)
 Medication Instructions:   Your physician recommends that you continue on your current medications as directed. Please refer to the Current Medication list given to you today.   *If you need a refill on your cardiac medications before your next appointment, please call your pharmacy*    Lab Work: NONE ORDERED  TODAY     If you have labs (blood work) drawn today and your tests are completely normal, you will receive your results only by: MyChart Message (if you have MyChart) OR A paper copy in the mail If you have any lab test that is abnormal or we need to change your treatment, we will call you to review the results.   Testing/Procedures: NONE ORDERED  TODAY   Follow-Up:  At [facility], you and your health needs are our priority.  As part of our continuing mission to provide you with exceptional heart care, our providers are all part of one team.  This team includes your primary Cardiologist (physician) and Advanced Practice Providers or APPs (Physician Assistants and Nurse Practitioners) who all work together to provide you with the care you need, when you need it.  Your next appointment:    4 month(s) ( CONTACT  CASSIE HALL/ ANGELINE HAMMER FOR EP SCHEDULING ISSUES )    Provider: [facility] ONLY NOT [place]   LOCATION DUE TO TRANSPORTATION       We recommend signing up for the patient portal called "MyChart".  Sign up information is provided on this After Visit Summary.  MyChart is used to connect with patients for Virtual Visits (Telemedicine).  Patients are able to view lab/test results, encounter notes, upcoming appointments, etc.  Non-urgent messages can be sent to your provider as well.   To learn more about what you can do with MyChart, go to [email or web address].   Other Instructions

## 2024-01-04 NOTE — Progress Notes (Signed)
  Cardiology Office Note:  .   Date:  01/04/2024  ID:  Maurice Hall, DOB 02-18-1931, MRN [id number] PCP: Barbar Levine, MD  [name] Providers Cardiologist: Dr. Carolynne Citron >> Dr. Lawana Pray    History of Present Illness: .   Maurice Hall is a 89 y.o. male w/PMHx of  HTN, arthritis SND > PPM AFib >> permanent Diastolic CHF  He saw Dr. Carolynne Citron 07/20/22, very limited with his arthritis, back pain, some DOE that he described as class II No changes were made  He saw Dr. Lawana Pray most recently on 01/08/23 >> no reported symptoms > given permanent AFib programmed VVI 50 to try and avoid RV pacing, no med changes  12/18/23 H/h 15/46 Plts 213 BUN/Creat 49/1.36   Today's visit is scheduled as an annual device visit ROS:   He is accompanied by his sons  Generally sounds about the same over the years Attributes his aches, pains, gait instability to his age Denies falls  He appears a bit winded after coming in/during our visit, he reports he tends to be a mouth breather, and didn't think his breathing pattern here was unusual from his baseline Denies CP Has bad back pain When asked about syncope, he denies but says once he woke up in bed, and did not recall going to bed. No palpitations or cardiac awareness No bleeding or signs of bleeding   Device information MDT dual chamber PPM implanted 07/07/2002, gen change 05/21/2015   Studies Reviewed: Aaron Aas    EKG done today and reviewed by myself:  AF, paced and conducted beats, 74bpm  DEVICE interrogation done today and reviewed by myself lead measurements are good Battery est is 5 mo to ERI VP 35.4% 100% AF Programmed VVIR 50   03/01/22: TTE LVEF 50-55% Trace AI, mild AS Trace MR/TR  Risk Assessment/Calculations:    Physical Exam:   VS:  There were no vitals taken for this visit.   Wt Readings from Last 3 Encounters:  01/08/23 217 lb 3.2 oz (98.5 kg)  07/20/22 202 lb 6.4 oz (91.8 kg)  06/15/22 193 lb (87.5 kg)     GEN: Well nourished, well developed in no acute distress, appears somewhat chronically ill, looks his age NECK: No JVD; No carotid bruits CARDIAC: irreg-irreg, no murmurs, rubs, gallops RESPIRATORY:  CTA b/l without rales, wheezing or rhonchi  ABDOMEN: Soft, non-tender, non-distended EXTREMITIES: trace edema; No deformity   PPM site: is stable, no thinning, fluctuation, tethering  ASSESSMENT AND PLAN: .    PPM intact function no programming changes made Nearing ERI Lost to [employer] Requested monthly battery remotes Device clinic RN today reviewed remote transmission with the patient/sons Clinic visit in [place] (preferred office) made as well  permanent AFib CHA2DS2Vasc is 4, on Xarelto , appropriately dosed Rate controlled Last Creat was 1.36, prior was 1.28, Creat clearance right about 49-52 follow  HTN Would allow some higher BPs given advanced age  Chronic diastolic CHF He looked a bit winded to me He reports that is his usual breathing pattern Denied any significant or rapid change over the years Tends to get winded  Lungs are clear, not edematous Has back pain as well that may contribute  Secondary hypercoagulable state 2/2 AFib    Dispo: 4 mo, sooner if needed  Signed, Debbie Fails, PA-C

## 2024-01-06 ENCOUNTER — Ambulatory Visit: Payer: Self-pay | Admitting: Cardiology

## 2024-01-06 DIAGNOSIS — I5030 Unspecified diastolic (congestive) heart failure: Secondary | ICD-10-CM | POA: Diagnosis not present

## 2024-01-06 DIAGNOSIS — E782 Mixed hyperlipidemia: Secondary | ICD-10-CM | POA: Diagnosis not present

## 2024-01-08 ENCOUNTER — Ambulatory Visit (INDEPENDENT_AMBULATORY_CARE_PROVIDER_SITE_OTHER)

## 2024-01-08 DIAGNOSIS — I495 Sick sinus syndrome: Secondary | ICD-10-CM

## 2024-01-10 LAB — CUP PACEART REMOTE DEVICE CHECK
Battery Remaining Longevity: 5 mo
Battery Voltage: 2.86 V
Brady Statistic RA Percent Paced: 0 %
Brady Statistic RV Percent Paced: 41.14 %
Date Time Interrogation Session: 20250603191300
Implantable Lead Connection Status: 753985
Implantable Lead Connection Status: 753985
Implantable Lead Implant Date: 20031201
Implantable Lead Implant Date: 20031201
Implantable Lead Location: 753859
Implantable Lead Location: 753860
Implantable Lead Model: 5076
Implantable Lead Model: 5076
Implantable Pulse Generator Implant Date: 20161014
Lead Channel Impedance Value: 304 Ohm
Lead Channel Impedance Value: 361 Ohm
Lead Channel Impedance Value: 361 Ohm
Lead Channel Impedance Value: 418 Ohm
Lead Channel Pacing Threshold Amplitude: 1 V
Lead Channel Pacing Threshold Amplitude: 1.5 V
Lead Channel Pacing Threshold Pulse Width: 0.4 ms
Lead Channel Pacing Threshold Pulse Width: 0.4 ms
Lead Channel Sensing Intrinsic Amplitude: 0.125 mV
Lead Channel Sensing Intrinsic Amplitude: 0.125 mV
Lead Channel Sensing Intrinsic Amplitude: 10.75 mV
Lead Channel Sensing Intrinsic Amplitude: 10.75 mV
Lead Channel Setting Pacing Amplitude: 2.5 V
Lead Channel Setting Pacing Pulse Width: 0.4 ms
Lead Channel Setting Sensing Sensitivity: 2.8 mV
Zone Setting Status: 755011
Zone Setting Status: 755011

## 2024-01-16 ENCOUNTER — Ambulatory Visit: Payer: Self-pay | Admitting: Cardiology

## 2024-01-25 DIAGNOSIS — E785 Hyperlipidemia, unspecified: Secondary | ICD-10-CM | POA: Diagnosis not present

## 2024-01-25 DIAGNOSIS — R7303 Prediabetes: Secondary | ICD-10-CM | POA: Diagnosis not present

## 2024-02-04 ENCOUNTER — Encounter

## 2024-02-04 DIAGNOSIS — R7303 Prediabetes: Secondary | ICD-10-CM | POA: Diagnosis not present

## 2024-02-04 DIAGNOSIS — I5030 Unspecified diastolic (congestive) heart failure: Secondary | ICD-10-CM | POA: Diagnosis not present

## 2024-02-04 DIAGNOSIS — Z6829 Body mass index (BMI) 29.0-29.9, adult: Secondary | ICD-10-CM | POA: Diagnosis not present

## 2024-02-04 DIAGNOSIS — I10 Essential (primary) hypertension: Secondary | ICD-10-CM | POA: Diagnosis not present

## 2024-02-04 DIAGNOSIS — E785 Hyperlipidemia, unspecified: Secondary | ICD-10-CM | POA: Diagnosis not present

## 2024-02-06 LAB — CUP PACEART REMOTE DEVICE CHECK
Battery Remaining Longevity: 4 mo
Battery Voltage: 2.85 V
Brady Statistic RA Percent Paced: 0 %
Brady Statistic RV Percent Paced: 51.87 %
Date Time Interrogation Session: 20250701185920
Implantable Lead Connection Status: 753985
Implantable Lead Connection Status: 753985
Implantable Lead Implant Date: 20031201
Implantable Lead Implant Date: 20031201
Implantable Lead Location: 753859
Implantable Lead Location: 753860
Implantable Lead Model: 5076
Implantable Lead Model: 5076
Implantable Pulse Generator Implant Date: 20161014
Lead Channel Impedance Value: 323 Ohm
Lead Channel Impedance Value: 380 Ohm
Lead Channel Impedance Value: 380 Ohm
Lead Channel Impedance Value: 418 Ohm
Lead Channel Pacing Threshold Amplitude: 1.125 V
Lead Channel Pacing Threshold Amplitude: 1.5 V
Lead Channel Pacing Threshold Pulse Width: 0.4 ms
Lead Channel Pacing Threshold Pulse Width: 0.4 ms
Lead Channel Sensing Intrinsic Amplitude: 0.375 mV
Lead Channel Sensing Intrinsic Amplitude: 0.375 mV
Lead Channel Sensing Intrinsic Amplitude: 12.75 mV
Lead Channel Sensing Intrinsic Amplitude: 12.75 mV
Lead Channel Setting Pacing Amplitude: 2.5 V
Lead Channel Setting Pacing Pulse Width: 0.4 ms
Lead Channel Setting Sensing Sensitivity: 2.8 mV
Zone Setting Status: 755011
Zone Setting Status: 755011

## 2024-02-08 ENCOUNTER — Ambulatory Visit

## 2024-02-08 DIAGNOSIS — I495 Sick sinus syndrome: Secondary | ICD-10-CM

## 2024-03-06 ENCOUNTER — Encounter

## 2024-03-06 NOTE — Progress Notes (Signed)
 Remote pacemaker transmission.

## 2024-03-06 NOTE — Addendum Note (Signed)
 Addended by: VICCI SELLER A on: 03/06/2024 08:35 AM   Modules accepted: Orders, Level of Service

## 2024-03-10 ENCOUNTER — Ambulatory Visit

## 2024-03-10 DIAGNOSIS — I495 Sick sinus syndrome: Secondary | ICD-10-CM | POA: Diagnosis not present

## 2024-03-12 LAB — CUP PACEART REMOTE DEVICE CHECK
Battery Remaining Longevity: 3 mo
Battery Voltage: 2.85 V
Brady Statistic RA Percent Paced: 0 %
Brady Statistic RV Percent Paced: 54.8 %
Date Time Interrogation Session: 20250805193032
Implantable Lead Connection Status: 753985
Implantable Lead Connection Status: 753985
Implantable Lead Implant Date: 20031201
Implantable Lead Implant Date: 20031201
Implantable Lead Location: 753859
Implantable Lead Location: 753860
Implantable Lead Model: 5076
Implantable Lead Model: 5076
Implantable Pulse Generator Implant Date: 20161014
Lead Channel Impedance Value: 304 Ohm
Lead Channel Impedance Value: 380 Ohm
Lead Channel Impedance Value: 380 Ohm
Lead Channel Impedance Value: 418 Ohm
Lead Channel Pacing Threshold Amplitude: 1 V
Lead Channel Pacing Threshold Amplitude: 1.5 V
Lead Channel Pacing Threshold Pulse Width: 0.4 ms
Lead Channel Pacing Threshold Pulse Width: 0.4 ms
Lead Channel Sensing Intrinsic Amplitude: 0.375 mV
Lead Channel Sensing Intrinsic Amplitude: 0.375 mV
Lead Channel Sensing Intrinsic Amplitude: 10.25 mV
Lead Channel Sensing Intrinsic Amplitude: 10.25 mV
Lead Channel Setting Pacing Amplitude: 2.5 V
Lead Channel Setting Pacing Pulse Width: 0.4 ms
Lead Channel Setting Sensing Sensitivity: 2.8 mV
Zone Setting Status: 755011
Zone Setting Status: 755011

## 2024-04-07 ENCOUNTER — Encounter

## 2024-04-10 ENCOUNTER — Ambulatory Visit (INDEPENDENT_AMBULATORY_CARE_PROVIDER_SITE_OTHER)

## 2024-04-10 DIAGNOSIS — I495 Sick sinus syndrome: Secondary | ICD-10-CM

## 2024-04-12 NOTE — Progress Notes (Unsigned)
  Electrophysiology Office Note:   Date:  04/14/2024  ID:  HANCEL ION, DOB Jul 07, 1931, MRN [id number]  Primary Cardiologist: None Primary Heart Failure: None Electrophysiologist: Akeila Lana Gladis Norton, MD      History of Present Illness:   Maurice Hall is a 89 y.o. male with h/o sinus node dysfunction, atrial fibrillation, hypertension seen today for routine electrophysiology followup.   Since last being seen in our clinic the patient reports doing overall well.  He has no acute complaints.  Has no cardiac awareness.  His device is nearing ERI and Ralonda Tartt need generator change in roughly 2 months.  he denies chest pain, palpitations, dyspnea, PND, orthopnea, nausea, vomiting, dizziness, syncope, edema, weight gain, or early satiety.   Review of systems complete and found to be negative unless listed in HPI.      EP Information / Studies Reviewed:    EKG is not ordered today. EKG from 01/04/24 reviewed which showed AF, V paced      PPM Interrogation-  reviewed in detail today,  See PACEART report.  Device History: Medtronic Dual Chamber PPM implanted  for CHB  Risk Assessment/Calculations:    CHA2DS2-VASc Score = 3   This indicates a 3.2% annual risk of stroke. The patient's score is based upon: CHF History: 0 HTN History: 1 Diabetes History: 0 Stroke History: 0 Vascular Disease History: 0 Age Score: 2 Gender Score: 0            Physical Exam:   VS:  BP 126/72   Pulse (!) 59   Ht 6' 2 (1.88 m)   Wt 215 lb 3.2 oz (97.6 kg)   SpO2 97%   BMI 27.63 kg/m    Wt Readings from Last 3 Encounters:  04/14/24 215 lb 3.2 oz (97.6 kg)  01/04/24 213 lb 12.8 oz (97 kg)  01/08/23 217 lb 3.2 oz (98.5 kg)     GEN: Well nourished, well developed in no acute distress NECK: No JVD; No carotid bruits CARDIAC: Regular rate and rhythm, no murmurs, rubs, gallops RESPIRATORY:  Clear to auscultation without rales, wheezing or rhonchi  ABDOMEN: Soft, non-tender,  non-distended EXTREMITIES:  No edema; No deformity   ASSESSMENT AND PLAN:    CHB s/p Medtronic PPM  Normal PPM function See Pace Art report No changes today Device is nearing ERI. Generator change.  Risks and benefits have been discussed.    Explained risks, benefits, and alternatives to generator change, including but not limited to bleeding and infection. Pt verbalized understanding and agrees to proceed.  2.  Permanent atrial fibrillation: On metoprolol.  Felicia Both rate control.  3.  Secondary hypercoagulable date: On Xarelto   4.  Hypertension: Well-controlled   Disposition:   Follow up with Dr. Norton post generator change   Signed, Yaviel Kloster Gladis Norton, MD

## 2024-04-14 ENCOUNTER — Ambulatory Visit: Attending: Cardiology | Admitting: Cardiology

## 2024-04-14 ENCOUNTER — Encounter: Payer: Self-pay | Admitting: Cardiology

## 2024-04-14 VITALS — BP 126/72 | HR 59 | Ht 74.0 in | Wt 215.2 lb

## 2024-04-14 DIAGNOSIS — I442 Atrioventricular block, complete: Secondary | ICD-10-CM | POA: Diagnosis not present

## 2024-04-14 LAB — CUP PACEART INCLINIC DEVICE CHECK
Date Time Interrogation Session: 20250908134702
Implantable Lead Connection Status: 753985
Implantable Lead Connection Status: 753985
Implantable Lead Implant Date: 20031201
Implantable Lead Implant Date: 20031201
Implantable Lead Location: 753859
Implantable Lead Location: 753860
Implantable Lead Model: 5076
Implantable Lead Model: 5076
Implantable Pulse Generator Implant Date: 20161014

## 2024-04-15 ENCOUNTER — Ambulatory Visit: Payer: Self-pay | Admitting: Cardiology

## 2024-04-15 LAB — CUP PACEART REMOTE DEVICE CHECK
Battery Remaining Longevity: 2 mo
Battery Voltage: 2.84 V
Brady Statistic RA Percent Paced: 0 %
Brady Statistic RV Percent Paced: 51.06 %
Date Time Interrogation Session: 20250908133355
Implantable Lead Connection Status: 753985
Implantable Lead Connection Status: 753985
Implantable Lead Implant Date: 20031201
Implantable Lead Implant Date: 20031201
Implantable Lead Location: 753859
Implantable Lead Location: 753860
Implantable Lead Model: 5076
Implantable Lead Model: 5076
Implantable Pulse Generator Implant Date: 20161014
Lead Channel Impedance Value: 304 Ohm
Lead Channel Impedance Value: 361 Ohm
Lead Channel Impedance Value: 380 Ohm
Lead Channel Impedance Value: 418 Ohm
Lead Channel Pacing Threshold Amplitude: 1 V
Lead Channel Pacing Threshold Amplitude: 1.5 V
Lead Channel Pacing Threshold Pulse Width: 0.4 ms
Lead Channel Pacing Threshold Pulse Width: 0.4 ms
Lead Channel Sensing Intrinsic Amplitude: 0.375 mV
Lead Channel Sensing Intrinsic Amplitude: 0.375 mV
Lead Channel Sensing Intrinsic Amplitude: 10.875 mV
Lead Channel Sensing Intrinsic Amplitude: 10.875 mV
Lead Channel Setting Pacing Amplitude: 2.5 V
Lead Channel Setting Pacing Pulse Width: 0.4 ms
Lead Channel Setting Sensing Sensitivity: 2.8 mV
Zone Setting Status: 755011
Zone Setting Status: 755011

## 2024-04-19 NOTE — Progress Notes (Signed)
 Remote PPM Transmission

## 2024-05-05 NOTE — Progress Notes (Signed)
 Remote PPM Transmission

## 2024-05-05 NOTE — Progress Notes (Signed)
 Remote pacemaker transmission.

## 2024-05-08 ENCOUNTER — Encounter

## 2024-05-12 ENCOUNTER — Ambulatory Visit

## 2024-05-21 LAB — CUP PACEART REMOTE DEVICE CHECK
Battery Remaining Longevity: 1 mo
Battery Voltage: 2.83 V
Brady Statistic RA Percent Paced: 0 %
Brady Statistic RV Percent Paced: 64.52 %
Date Time Interrogation Session: 20251014185546
Implantable Lead Connection Status: 753985
Implantable Lead Connection Status: 753985
Implantable Lead Implant Date: 20031201
Implantable Lead Implant Date: 20031201
Implantable Lead Location: 753859
Implantable Lead Location: 753860
Implantable Lead Model: 5076
Implantable Lead Model: 5076
Implantable Pulse Generator Implant Date: 20161014
Lead Channel Impedance Value: 323 Ohm
Lead Channel Impedance Value: 361 Ohm
Lead Channel Impedance Value: 380 Ohm
Lead Channel Impedance Value: 418 Ohm
Lead Channel Pacing Threshold Amplitude: 1 V
Lead Channel Pacing Threshold Amplitude: 1.5 V
Lead Channel Pacing Threshold Pulse Width: 0.4 ms
Lead Channel Pacing Threshold Pulse Width: 0.4 ms
Lead Channel Sensing Intrinsic Amplitude: 0.25 mV
Lead Channel Sensing Intrinsic Amplitude: 0.25 mV
Lead Channel Sensing Intrinsic Amplitude: 12.875 mV
Lead Channel Sensing Intrinsic Amplitude: 12.875 mV
Lead Channel Setting Pacing Amplitude: 2.5 V
Lead Channel Setting Pacing Pulse Width: 0.4 ms
Lead Channel Setting Sensing Sensitivity: 2.8 mV
Zone Setting Status: 755011
Zone Setting Status: 755011

## 2024-05-27 DIAGNOSIS — I130 Hypertensive heart and chronic kidney disease with heart failure and stage 1 through stage 4 chronic kidney disease, or unspecified chronic kidney disease: Secondary | ICD-10-CM | POA: Diagnosis not present

## 2024-05-27 DIAGNOSIS — I495 Sick sinus syndrome: Secondary | ICD-10-CM | POA: Diagnosis not present

## 2024-05-27 DIAGNOSIS — N1831 Chronic kidney disease, stage 3a: Secondary | ICD-10-CM | POA: Diagnosis not present

## 2024-05-27 DIAGNOSIS — I700 Atherosclerosis of aorta: Secondary | ICD-10-CM | POA: Diagnosis not present

## 2024-05-27 DIAGNOSIS — D6869 Other thrombophilia: Secondary | ICD-10-CM | POA: Diagnosis not present

## 2024-05-27 DIAGNOSIS — G309 Alzheimer's disease, unspecified: Secondary | ICD-10-CM | POA: Diagnosis not present

## 2024-05-27 DIAGNOSIS — I4891 Unspecified atrial fibrillation: Secondary | ICD-10-CM | POA: Diagnosis not present

## 2024-05-27 DIAGNOSIS — I509 Heart failure, unspecified: Secondary | ICD-10-CM | POA: Diagnosis not present

## 2024-05-27 DIAGNOSIS — F0150 Vascular dementia without behavioral disturbance: Secondary | ICD-10-CM | POA: Diagnosis not present

## 2024-06-09 ENCOUNTER — Encounter

## 2024-06-11 ENCOUNTER — Telehealth: Payer: Self-pay

## 2024-06-11 DIAGNOSIS — Z01812 Encounter for preprocedural laboratory examination: Secondary | ICD-10-CM

## 2024-06-11 DIAGNOSIS — I442 Atrioventricular block, complete: Secondary | ICD-10-CM

## 2024-06-11 NOTE — Telephone Encounter (Signed)
 Alert received from [employer] for pacemaker @ [facility] 05/22/24. Patient has already talked with Dr. Inocencio and gen change. Needs scheduling. Routing to EP scheduler.

## 2024-06-12 ENCOUNTER — Encounter

## 2024-06-19 DIAGNOSIS — H3411 Central retinal artery occlusion, right eye: Secondary | ICD-10-CM | POA: Diagnosis not present

## 2024-06-19 DIAGNOSIS — H4051X4 Glaucoma secondary to other eye disorders, right eye, indeterminate stage: Secondary | ICD-10-CM | POA: Diagnosis not present

## 2024-06-23 DIAGNOSIS — E785 Hyperlipidemia, unspecified: Secondary | ICD-10-CM | POA: Diagnosis not present

## 2024-06-23 DIAGNOSIS — R7303 Prediabetes: Secondary | ICD-10-CM | POA: Diagnosis not present

## 2024-06-30 DIAGNOSIS — Z23 Encounter for immunization: Secondary | ICD-10-CM | POA: Diagnosis not present

## 2024-07-01 ENCOUNTER — Encounter: Payer: Self-pay | Admitting: *Deleted

## 2024-07-01 NOTE — Telephone Encounter (Signed)
 Spoke to Amy, dtr-in-law. Scheduled PPM gen change for 12/30. They will get blood work the week of 12/15. Letter sent via mychart. (Scrub given to pt at last OV) Amy verbalized understanding and agreeable to plan.

## 2024-07-10 ENCOUNTER — Encounter (HOSPITAL_COMMUNITY): Payer: Self-pay

## 2024-07-13 ENCOUNTER — Ambulatory Visit: Attending: Family Medicine

## 2024-07-14 ENCOUNTER — Encounter

## 2024-07-15 ENCOUNTER — Telehealth (HOSPITAL_COMMUNITY): Payer: Self-pay

## 2024-07-15 NOTE — Telephone Encounter (Signed)
 Spoke with patient's daughter, Maurice Hall, to complete pre-procedure call.     Health status review:  Any new medical conditions, recent signs of acute illness or been started on antibiotics? Reported patient has a pus pocket on the gum and scheduled for a root canal on 12/11. He will be given prophylactic antibiotics. No active s/o infection. Advised to contact office if patient develops any s/o infection.  Follow all medication instructions prior to procedure or the procedure may be rescheduled:    Hold your Xarelto  2 days prior, take your last dose on Saturday, December 27th. On the morning of your procedure, take your other morning medications with sips of water -- EXCEPT DO NOT TAKE YOUR TORSEMIDE.  The night before your procedure and the morning of your procedure, wash thoroughly with the CHG surgical soap from the neck down, paying special attention to the area where your procedure will be performed.  You may have a LIGHT meal prior to 7:00am on the morning of your procedure and clear liquids until 2 hours prior to procedure.   Pre-procedure testing scheduled: lab work by December 16.  Confirmed patient is scheduled for  PPM generator change on Tuesday, December 30 with Dr. Inocencio. Instructed patient to arrive at the Main Entrance A at [facility]: [address] [place], [place] [address] and check in at Admitting at 1:30 PM.  Plan to go home the same day, you will only stay overnight if medically necessary. You MUST have a responsible adult to drive you home and MUST be with you the first 24 hours after you arrive home or your procedure could be cancelled.  Informed a nurse may call a day before the procedure to confirm arrival time and ensure instructions are followed.  Maurice Hall verbalized understanding to information provided and is agreeable to proceed with procedure.   Advised to contact RN Navigator at [phone], to inform of any new medications started after call or  concerns prior to procedure.

## 2024-07-15 NOTE — Telephone Encounter (Signed)
 Attempted to reach patient to discuss upcoming procedure, no answer. Left VM for patient to return call.

## 2024-07-22 LAB — CBC
Hematocrit: 44.7 % (ref 37.5–51.0)
Hemoglobin: 14.2 g/dL (ref 13.0–17.7)
MCH: 31.1 pg (ref 26.6–33.0)
MCHC: 31.8 g/dL (ref 31.5–35.7)
MCV: 98 fL — ABNORMAL HIGH (ref 79–97)
Platelets: 221 x10E3/uL (ref 150–450)
RBC: 4.57 x10E6/uL (ref 4.14–5.80)
RDW: 14.1 % (ref 11.6–15.4)
WBC: 6.8 x10E3/uL (ref 3.4–10.8)

## 2024-07-22 LAB — BASIC METABOLIC PANEL WITH GFR
BUN/Creatinine Ratio: 15 (ref 10–24)
BUN: 17 mg/dL (ref 10–36)
CO2: 20 mmol/L (ref 20–29)
Calcium: 9.4 mg/dL (ref 8.6–10.2)
Chloride: 105 mmol/L (ref 96–106)
Creatinine, Ser: 1.17 mg/dL (ref 0.76–1.27)
Glucose: 103 mg/dL — ABNORMAL HIGH (ref 70–99)
Potassium: 3.9 mmol/L (ref 3.5–5.2)
Sodium: 143 mmol/L (ref 134–144)
eGFR: 59 mL/min/1.73 — ABNORMAL LOW (ref >59)

## 2024-08-04 NOTE — Pre-Procedure Instructions (Signed)
 Spoke with Alm, son.  Reviewed the following items: Arrival time 1300 Nothing to eat or drink after midnight No meds AM of procedure Responsible person to drive you home and stay with you for 24 hrs Wash with special soap night before and morning of procedure If on anti-coagulant drug instructions Xarelto - last dose 12/27

## 2024-08-05 ENCOUNTER — Ambulatory Visit (HOSPITAL_COMMUNITY)
Admission: RE | Admit: 2024-08-05 | Discharge: 2024-08-05 | Disposition: A | Discharge status: Home / Self Care | Attending: Cardiology | Admitting: Cardiology

## 2024-08-05 ENCOUNTER — Encounter (HOSPITAL_COMMUNITY)
Admission: RE | Disposition: A | Discharge status: Home / Self Care | Payer: Self-pay | Source: Home / Self Care | Attending: Cardiology

## 2024-08-05 ENCOUNTER — Other Ambulatory Visit: Payer: Self-pay

## 2024-08-05 DIAGNOSIS — Z45010 Encounter for checking and testing of cardiac pacemaker pulse generator [battery]: Secondary | ICD-10-CM | POA: Insufficient documentation

## 2024-08-05 DIAGNOSIS — I442 Atrioventricular block, complete: Secondary | ICD-10-CM | POA: Insufficient documentation

## 2024-08-05 DIAGNOSIS — I10 Essential (primary) hypertension: Secondary | ICD-10-CM | POA: Diagnosis not present

## 2024-08-05 DIAGNOSIS — Z950 Presence of cardiac pacemaker: Secondary | ICD-10-CM | POA: Diagnosis not present

## 2024-08-05 DIAGNOSIS — R001 Bradycardia, unspecified: Secondary | ICD-10-CM | POA: Diagnosis not present

## 2024-08-05 DIAGNOSIS — I495 Sick sinus syndrome: Secondary | ICD-10-CM | POA: Diagnosis not present

## 2024-08-05 HISTORY — PX: PPM GENERATOR CHANGEOUT: EP1233

## 2024-08-05 SURGERY — PPM GENERATOR CHANGEOUT

## 2024-08-05 MED ORDER — CEFAZOLIN SODIUM-DEXTROSE 2-4 GM/100ML-% IV SOLN
INTRAVENOUS | Status: AC
  Filled 2024-08-05: qty 100

## 2024-08-05 MED ORDER — LIDOCAINE HCL (PF) 1 % IJ SOLN
INTRAMUSCULAR | Status: AC
  Filled 2024-08-05: qty 60

## 2024-08-05 MED ORDER — CEFAZOLIN SODIUM-DEXTROSE 2-4 GM/100ML-% IV SOLN
2.0000 g | INTRAVENOUS | Status: AC
  Administered 2024-08-05: 2 g via INTRAVENOUS

## 2024-08-05 MED ORDER — ONDANSETRON HCL 4 MG/2ML IJ SOLN: 4.0000 mg | Freq: Four times a day (QID) | INTRAMUSCULAR | Status: DC | PRN

## 2024-08-05 MED ORDER — CHLORHEXIDINE GLUCONATE 4 % EX SOLN
4.0000 | Freq: Once | CUTANEOUS | Status: DC
  Filled 2024-08-05: qty 60

## 2024-08-05 MED ORDER — SODIUM CHLORIDE 0.9 % IV SOLN
INTRAVENOUS | Status: AC
  Filled 2024-08-05: qty 2

## 2024-08-05 MED ORDER — SODIUM CHLORIDE 0.9 % IV SOLN: INTRAVENOUS | Status: DC

## 2024-08-05 MED ORDER — SODIUM CHLORIDE 0.9 % IV SOLN
80.0000 mg | INTRAVENOUS | Status: AC
  Administered 2024-08-05: 80 mg

## 2024-08-05 MED ORDER — LIDOCAINE HCL (PF) 1 % IJ SOLN
INTRAMUSCULAR | Status: DC | PRN
  Administered 2024-08-05: 60 mL

## 2024-08-05 MED ORDER — ACETAMINOPHEN 325 MG PO TABS: 325.0000 mg | ORAL_TABLET | ORAL | Status: DC | PRN

## 2024-08-05 SURGICAL SUPPLY — 5 items
CABLE SURGICAL S-101-97-12 (CABLE) ×1 IMPLANT
IPG PACE AZUR XT DR MRI W1DR01 (Pacemaker) IMPLANT
PAD DEFIB RADIO PHYSIO CONN (PAD) ×1 IMPLANT
POUCH AIGIS-R ANTIBACT PPM MED (Mesh General) IMPLANT
TRAY PACEMAKER INSERTION (PACKS) ×1 IMPLANT

## 2024-08-05 NOTE — Progress Notes (Addendum)
 Discharge instructions reviewed with patient and  son at bedside. Denies questions concerns. Seen by Device Rep. incision site remains clean dry and intact. No s/s of complications.

## 2024-08-05 NOTE — Discharge Instructions (Addendum)

## 2024-08-05 NOTE — H&P (Signed)
" °  Electrophysiology Office Note:   Date:  08/05/2024  ID:  Maurice Hall, DOB Aug 19, 1930, MRN [id number]  Primary Cardiologist: None Primary Heart Failure: None Electrophysiologist: Austynn Pridmore Gladis Norton, MD      History of Present Illness:   Maurice Hall is a 89 y.o. male with h/o sinus node dysfunction, atrial fibrillation, hypertension seen today for routine electrophysiology followup.   Today, denies symptoms of palpitations, chest pain, dyspnea, orthopnea, PND, lower extremity edema, claudication, dizziness, presyncope, syncope, bleeding, or neurologic sequela. The patient is tolerating medications without difficulties. Plan generator change today.     EP Information / Studies Reviewed:    EKG is not ordered today. EKG from 01/04/24 reviewed which showed AF, V paced      PPM Interrogation-  reviewed in detail today,  See PACEART report.  Device History: Medtronic Dual Chamber PPM implanted  for CHB  Risk Assessment/Calculations:    CHA2DS2-VASc Score = 3   This indicates a 3.2% annual risk of stroke. The patient's score is based upon: CHF History: 0 HTN History: 1 Diabetes History: 0 Stroke History: 0 Vascular Disease History: 0 Age Score: 2 Gender Score: 0            Physical Exam:   VS:  There were no vitals taken for this visit.   Wt Readings from Last 3 Encounters:  04/14/24 97.6 kg  01/04/24 97 kg  01/08/23 98.5 kg    GEN: Well nourished, well developed in no acute distress NECK: No JVD; No carotid bruits CARDIAC: Regular rate and rhythm, no murmurs, rubs, gallops RESPIRATORY:  Clear to auscultation without rales, wheezing or rhonchi  ABDOMEN: Soft, non-tender, non-distended EXTREMITIES:  No edema; No deformity    ASSESSMENT AND PLAN:    CHB s/p Medtronic PPM  Maurice Hall has presented today for surgery, with the diagnosis of pacemaker ERI.  The various methods of treatment have been discussed with the patient and family. After consideration of risks,  benefits and other options for treatment, the patient has consented to  Procedure(s): Pacemaker generator change as a surgical intervention .  Risks include but not limited to bleeding, infection, pneumothorax, perforation, tamponade, vascular damage, renal failure, MI, stroke, death, and lead dislodgement . The patient's history has been reviewed, patient examined, no change in status, stable for surgery.  I have reviewed the patient's chart and labs.  Questions were answered to the patient's satisfaction.    Maurice Clayson Norton, MD 08/05/2024 1:34 PM  "

## 2024-08-06 ENCOUNTER — Encounter (HOSPITAL_COMMUNITY): Payer: Self-pay | Admitting: Cardiology

## 2024-08-13 ENCOUNTER — Ambulatory Visit

## 2024-08-14 ENCOUNTER — Encounter

## 2024-08-25 ENCOUNTER — Ambulatory Visit: Attending: Cardiology

## 2024-08-25 DIAGNOSIS — I495 Sick sinus syndrome: Secondary | ICD-10-CM

## 2024-08-25 LAB — CUP PACEART INCLINIC DEVICE CHECK
Brady Statistic RA Percent Paced: 43.2 %
Brady Statistic RV Percent Paced: 0 %
Date Time Interrogation Session: 20260119085632
Implantable Lead Connection Status: 753985
Implantable Lead Connection Status: 753985
Implantable Lead Implant Date: 20031201
Implantable Lead Implant Date: 20031201
Implantable Lead Location: 753859
Implantable Lead Location: 753860
Implantable Lead Model: 5076
Implantable Lead Model: 5076
Implantable Pulse Generator Implant Date: 20251230
Lead Channel Impedance Value: 418 Ohm
Lead Channel Impedance Value: 532 Ohm
Lead Channel Pacing Threshold Amplitude: 1 V
Lead Channel Pacing Threshold Pulse Width: 0.4 ms
Lead Channel Sensing Intrinsic Amplitude: 14.3 mV

## 2024-08-25 NOTE — Progress Notes (Signed)
 Normal dual chamber pacemaker wound check programmed VVIR. Presenting rhythm: VS 60 . Wound well healed. Routine testing of thresholds, sensing, and impedance demonstrate stable parameters and no programming changes needed at this time. Brief NSVT episodes. Estimated longevity 13+ years. Pt enrolled in remote follow-up.

## 2024-08-25 NOTE — Patient Instructions (Signed)

## 2024-08-26 ENCOUNTER — Ambulatory Visit: Payer: Self-pay | Admitting: Cardiology

## 2024-09-16 ENCOUNTER — Ambulatory Visit

## 2024-12-01 ENCOUNTER — Ambulatory Visit: Admitting: Cardiology

## 2024-12-16 ENCOUNTER — Ambulatory Visit

## 2025-03-17 ENCOUNTER — Ambulatory Visit

## 2025-06-16 ENCOUNTER — Ambulatory Visit

## 2025-09-15 ENCOUNTER — Ambulatory Visit
